# Patient Record
Sex: Male | Born: 1935 | Race: White | Hispanic: No | Smoking: Former smoker
Health system: Southern US, Community
[De-identification: ages and names within clinical notes are randomized; demographics above are authoritative.]

## PROBLEM LIST (undated history)

## (undated) DIAGNOSIS — J8410 Pulmonary fibrosis, unspecified: Secondary | ICD-10-CM

## (undated) DIAGNOSIS — I10 Essential (primary) hypertension: Secondary | ICD-10-CM

## (undated) DIAGNOSIS — I2510 Atherosclerotic heart disease of native coronary artery without angina pectoris: Secondary | ICD-10-CM

## (undated) DIAGNOSIS — E785 Hyperlipidemia, unspecified: Secondary | ICD-10-CM

## (undated) DIAGNOSIS — I219 Acute myocardial infarction, unspecified: Secondary | ICD-10-CM

## (undated) DIAGNOSIS — K922 Gastrointestinal hemorrhage, unspecified: Secondary | ICD-10-CM

## (undated) DIAGNOSIS — E119 Type 2 diabetes mellitus without complications: Secondary | ICD-10-CM

## (undated) DIAGNOSIS — R319 Hematuria, unspecified: Secondary | ICD-10-CM

## (undated) DIAGNOSIS — J449 Chronic obstructive pulmonary disease, unspecified: Secondary | ICD-10-CM

## (undated) DIAGNOSIS — M109 Gout, unspecified: Secondary | ICD-10-CM

## (undated) DIAGNOSIS — I255 Ischemic cardiomyopathy: Secondary | ICD-10-CM

## (undated) DIAGNOSIS — K219 Gastro-esophageal reflux disease without esophagitis: Secondary | ICD-10-CM

## (undated) DIAGNOSIS — I712 Thoracic aortic aneurysm, without rupture: Secondary | ICD-10-CM

## (undated) DIAGNOSIS — I7121 Aneurysm of the ascending aorta, without rupture: Secondary | ICD-10-CM

## (undated) DIAGNOSIS — I4891 Unspecified atrial fibrillation: Secondary | ICD-10-CM

## (undated) DIAGNOSIS — B1920 Unspecified viral hepatitis C without hepatic coma: Secondary | ICD-10-CM

## (undated) HISTORY — DX: Gastro-esophageal reflux disease without esophagitis: K21.9

## (undated) HISTORY — DX: Type 2 diabetes mellitus without complications: E11.9

## (undated) HISTORY — DX: Hematuria, unspecified: R31.9

## (undated) HISTORY — DX: Pulmonary fibrosis, unspecified: J84.10

## (undated) HISTORY — DX: Unspecified atrial fibrillation: I48.91

## (undated) HISTORY — DX: Atherosclerotic heart disease of native coronary artery without angina pectoris: I25.10

## (undated) HISTORY — DX: Essential (primary) hypertension: I10

## (undated) HISTORY — DX: Gout, unspecified: M10.9

## (undated) HISTORY — DX: Acute myocardial infarction, unspecified: I21.9

## (undated) HISTORY — PX: STOMACH SURGERY: SHX791

## (undated) HISTORY — DX: Hyperlipidemia, unspecified: E78.5

## (undated) HISTORY — DX: Gastrointestinal hemorrhage, unspecified: K92.2

## (undated) HISTORY — PX: MASTOIDECTOMY: SHX711

---

## 1971-08-29 HISTORY — PX: VAGOTOMY: SUR1431

## 1987-08-29 DIAGNOSIS — I219 Acute myocardial infarction, unspecified: Secondary | ICD-10-CM

## 1987-08-29 HISTORY — DX: Acute myocardial infarction, unspecified: I21.9

## 1994-08-28 HISTORY — PX: PR VEIN BYPASS GRAFT,AORTO-FEM-POP: 35551

## 1994-12-27 HISTORY — PX: CORONARY ARTERY BYPASS GRAFT: SHX141

## 1998-02-12 ENCOUNTER — Emergency Department (HOSPITAL_COMMUNITY): Admission: EM | Admit: 1998-02-12 | Discharge: 1998-02-12 | Payer: Self-pay | Admitting: Emergency Medicine

## 1999-03-15 ENCOUNTER — Encounter: Admission: RE | Admit: 1999-03-15 | Discharge: 1999-03-28 | Payer: Self-pay

## 1999-03-29 ENCOUNTER — Encounter: Admission: RE | Admit: 1999-03-29 | Discharge: 1999-06-27 | Payer: Self-pay

## 1999-11-01 ENCOUNTER — Encounter: Payer: Self-pay | Admitting: Cardiovascular Disease

## 1999-11-01 ENCOUNTER — Encounter: Admission: RE | Admit: 1999-11-01 | Discharge: 1999-11-01 | Payer: Self-pay | Admitting: Cardiovascular Disease

## 2000-07-04 ENCOUNTER — Encounter: Payer: Self-pay | Admitting: Cardiovascular Disease

## 2001-01-29 ENCOUNTER — Ambulatory Visit (HOSPITAL_COMMUNITY): Admission: RE | Admit: 2001-01-29 | Discharge: 2001-01-29 | Payer: Self-pay | Admitting: Cardiovascular Disease

## 2001-01-30 ENCOUNTER — Encounter: Payer: Self-pay | Admitting: Cardiovascular Disease

## 2001-06-10 ENCOUNTER — Ambulatory Visit (HOSPITAL_COMMUNITY): Admission: RE | Admit: 2001-06-10 | Discharge: 2001-06-10 | Payer: Self-pay | Admitting: Family Medicine

## 2001-06-10 ENCOUNTER — Encounter: Payer: Self-pay | Admitting: Family Medicine

## 2003-02-18 ENCOUNTER — Encounter: Payer: Self-pay | Admitting: Gastroenterology

## 2003-02-18 ENCOUNTER — Ambulatory Visit (HOSPITAL_COMMUNITY): Admission: RE | Admit: 2003-02-18 | Discharge: 2003-02-18 | Payer: Self-pay | Admitting: Gastroenterology

## 2003-02-21 ENCOUNTER — Encounter: Payer: Self-pay | Admitting: Emergency Medicine

## 2003-02-21 ENCOUNTER — Inpatient Hospital Stay (HOSPITAL_COMMUNITY): Admission: EM | Admit: 2003-02-21 | Discharge: 2003-02-24 | Payer: Self-pay | Admitting: Emergency Medicine

## 2003-07-31 ENCOUNTER — Ambulatory Visit (HOSPITAL_COMMUNITY): Admission: RE | Admit: 2003-07-31 | Discharge: 2003-07-31 | Payer: Self-pay | Admitting: Family Medicine

## 2005-02-16 ENCOUNTER — Encounter: Admission: RE | Admit: 2005-02-16 | Discharge: 2005-02-16 | Payer: Self-pay | Admitting: Cardiothoracic Surgery

## 2005-06-06 ENCOUNTER — Observation Stay (HOSPITAL_COMMUNITY): Admission: EM | Admit: 2005-06-06 | Discharge: 2005-06-06 | Payer: Self-pay | Admitting: Emergency Medicine

## 2005-07-13 ENCOUNTER — Ambulatory Visit: Payer: Self-pay | Admitting: Gastroenterology

## 2005-08-04 ENCOUNTER — Ambulatory Visit: Payer: Self-pay | Admitting: Gastroenterology

## 2005-08-11 ENCOUNTER — Ambulatory Visit: Payer: Self-pay | Admitting: Gastroenterology

## 2005-08-21 ENCOUNTER — Emergency Department (HOSPITAL_COMMUNITY): Admission: EM | Admit: 2005-08-21 | Discharge: 2005-08-21 | Payer: Self-pay | Admitting: *Deleted

## 2006-02-01 ENCOUNTER — Encounter: Admission: RE | Admit: 2006-02-01 | Discharge: 2006-02-01 | Payer: Self-pay | Admitting: Cardiothoracic Surgery

## 2006-02-26 ENCOUNTER — Ambulatory Visit (HOSPITAL_COMMUNITY): Admission: RE | Admit: 2006-02-26 | Discharge: 2006-02-26 | Payer: Self-pay | Admitting: Otolaryngology

## 2006-04-06 ENCOUNTER — Ambulatory Visit: Payer: Self-pay | Admitting: Gastroenterology

## 2006-04-07 ENCOUNTER — Emergency Department (HOSPITAL_COMMUNITY): Admission: EM | Admit: 2006-04-07 | Discharge: 2006-04-07 | Payer: Self-pay | Admitting: Emergency Medicine

## 2006-04-11 ENCOUNTER — Ambulatory Visit: Payer: Self-pay | Admitting: Gastroenterology

## 2006-04-20 ENCOUNTER — Ambulatory Visit: Payer: Self-pay | Admitting: Gastroenterology

## 2006-05-02 ENCOUNTER — Ambulatory Visit: Payer: Self-pay | Admitting: Gastroenterology

## 2006-05-02 ENCOUNTER — Encounter (INDEPENDENT_AMBULATORY_CARE_PROVIDER_SITE_OTHER): Payer: Self-pay | Admitting: Specialist

## 2006-06-14 ENCOUNTER — Ambulatory Visit: Payer: Self-pay | Admitting: Gastroenterology

## 2007-01-04 ENCOUNTER — Ambulatory Visit: Payer: Self-pay | Admitting: Gastroenterology

## 2007-01-04 LAB — CONVERTED CEMR LAB
ALT: 15 units/L (ref 0–40)
Amylase: 39 units/L (ref 27–131)
Basophils Absolute: 0.1 10*3/uL (ref 0.0–0.1)
Bilirubin, Direct: 0.2 mg/dL (ref 0.0–0.3)
Calcium: 9.1 mg/dL (ref 8.4–10.5)
Folate: 10.3 ng/mL
GFR calc Af Amer: 75 mL/min
GFR calc non Af Amer: 62 mL/min
Glucose, Bld: 99 mg/dL (ref 70–99)
Hemoglobin: 13.8 g/dL (ref 13.0–17.0)
MCHC: 34.3 g/dL (ref 30.0–36.0)
Monocytes Absolute: 0.8 10*3/uL — ABNORMAL HIGH (ref 0.2–0.7)
Monocytes Relative: 9.5 % (ref 3.0–11.0)
Platelets: 161 10*3/uL (ref 150–400)
RDW: 12.5 % (ref 11.5–14.6)
Sodium: 140 meq/L (ref 135–145)
TSH: 2.69 microintl units/mL (ref 0.35–5.50)
Vitamin B-12: 279 pg/mL (ref 211–911)

## 2007-02-14 ENCOUNTER — Encounter: Admission: RE | Admit: 2007-02-14 | Discharge: 2007-02-14 | Payer: Self-pay | Admitting: Thoracic Surgery

## 2007-02-14 ENCOUNTER — Ambulatory Visit: Payer: Self-pay | Admitting: Cardiothoracic Surgery

## 2007-02-14 ENCOUNTER — Ambulatory Visit: Payer: Self-pay | Admitting: Gastroenterology

## 2007-07-30 ENCOUNTER — Ambulatory Visit (HOSPITAL_COMMUNITY): Admission: RE | Admit: 2007-07-30 | Discharge: 2007-07-30 | Payer: Self-pay | Admitting: Cardiovascular Disease

## 2007-08-29 DIAGNOSIS — I4891 Unspecified atrial fibrillation: Secondary | ICD-10-CM

## 2007-08-29 HISTORY — DX: Unspecified atrial fibrillation: I48.91

## 2007-09-22 ENCOUNTER — Emergency Department (HOSPITAL_COMMUNITY): Admission: EM | Admit: 2007-09-22 | Discharge: 2007-09-23 | Payer: Self-pay | Admitting: Emergency Medicine

## 2007-10-25 ENCOUNTER — Ambulatory Visit: Payer: Self-pay | Admitting: Gastroenterology

## 2007-10-25 LAB — CONVERTED CEMR LAB
Basophils Relative: 0.7 % (ref 0.0–1.0)
Eosinophils Relative: 4.6 % (ref 0.0–5.0)
Ferritin: 130.1 ng/mL (ref 22.0–322.0)
Folate: 9.6 ng/mL
Iron: 46 ug/dL (ref 42–165)
Platelets: 144 10*3/uL — ABNORMAL LOW (ref 150–400)
RBC: 4.5 M/uL (ref 4.22–5.81)
Saturation Ratios: 16.6 % — ABNORMAL LOW (ref 20.0–50.0)
WBC: 5.3 10*3/uL (ref 4.5–10.5)

## 2007-11-13 DIAGNOSIS — I119 Hypertensive heart disease without heart failure: Secondary | ICD-10-CM | POA: Insufficient documentation

## 2007-11-13 DIAGNOSIS — I10 Essential (primary) hypertension: Secondary | ICD-10-CM | POA: Insufficient documentation

## 2007-11-13 DIAGNOSIS — E785 Hyperlipidemia, unspecified: Secondary | ICD-10-CM

## 2007-11-13 DIAGNOSIS — K219 Gastro-esophageal reflux disease without esophagitis: Secondary | ICD-10-CM | POA: Insufficient documentation

## 2007-11-15 ENCOUNTER — Ambulatory Visit: Payer: Self-pay | Admitting: Critical Care Medicine

## 2007-11-15 DIAGNOSIS — I4891 Unspecified atrial fibrillation: Secondary | ICD-10-CM | POA: Insufficient documentation

## 2007-11-15 DIAGNOSIS — J849 Interstitial pulmonary disease, unspecified: Secondary | ICD-10-CM | POA: Insufficient documentation

## 2007-11-15 DIAGNOSIS — I2510 Atherosclerotic heart disease of native coronary artery without angina pectoris: Secondary | ICD-10-CM | POA: Insufficient documentation

## 2007-11-28 ENCOUNTER — Telehealth (INDEPENDENT_AMBULATORY_CARE_PROVIDER_SITE_OTHER): Payer: Self-pay | Admitting: *Deleted

## 2007-12-21 DIAGNOSIS — M109 Gout, unspecified: Secondary | ICD-10-CM

## 2007-12-21 DIAGNOSIS — K449 Diaphragmatic hernia without obstruction or gangrene: Secondary | ICD-10-CM | POA: Insufficient documentation

## 2007-12-21 DIAGNOSIS — K319 Disease of stomach and duodenum, unspecified: Secondary | ICD-10-CM | POA: Insufficient documentation

## 2008-01-17 ENCOUNTER — Telehealth: Payer: Self-pay | Admitting: Gastroenterology

## 2008-02-13 ENCOUNTER — Ambulatory Visit: Payer: Self-pay | Admitting: Cardiothoracic Surgery

## 2008-02-13 ENCOUNTER — Encounter: Admission: RE | Admit: 2008-02-13 | Discharge: 2008-02-13 | Payer: Self-pay | Admitting: Cardiothoracic Surgery

## 2008-07-28 ENCOUNTER — Encounter: Admission: RE | Admit: 2008-07-28 | Discharge: 2008-07-28 | Payer: Self-pay | Admitting: Family Medicine

## 2008-12-29 ENCOUNTER — Ambulatory Visit: Payer: Self-pay | Admitting: Internal Medicine

## 2009-02-08 ENCOUNTER — Encounter: Payer: Self-pay | Admitting: Critical Care Medicine

## 2009-02-18 ENCOUNTER — Encounter: Admission: RE | Admit: 2009-02-18 | Discharge: 2009-02-18 | Payer: Self-pay | Admitting: Cardiothoracic Surgery

## 2009-02-18 ENCOUNTER — Ambulatory Visit (HOSPITAL_COMMUNITY): Admission: RE | Admit: 2009-02-18 | Discharge: 2009-02-18 | Payer: Self-pay | Admitting: Cardiovascular Disease

## 2009-03-02 ENCOUNTER — Ambulatory Visit: Payer: Self-pay | Admitting: Cardiothoracic Surgery

## 2010-02-17 ENCOUNTER — Ambulatory Visit: Payer: Self-pay | Admitting: Cardiothoracic Surgery

## 2010-02-17 ENCOUNTER — Encounter: Admission: RE | Admit: 2010-02-17 | Discharge: 2010-02-17 | Payer: Self-pay | Admitting: Cardiothoracic Surgery

## 2010-07-08 ENCOUNTER — Encounter: Admission: RE | Admit: 2010-07-08 | Discharge: 2010-07-08 | Payer: Self-pay | Admitting: Cardiovascular Disease

## 2010-07-15 ENCOUNTER — Ambulatory Visit: Payer: Self-pay | Admitting: Critical Care Medicine

## 2010-07-15 DIAGNOSIS — J209 Acute bronchitis, unspecified: Secondary | ICD-10-CM

## 2010-08-08 ENCOUNTER — Ambulatory Visit: Payer: Self-pay | Admitting: Adult Health

## 2010-08-09 ENCOUNTER — Ambulatory Visit: Payer: Self-pay | Admitting: Critical Care Medicine

## 2010-08-19 ENCOUNTER — Telehealth: Payer: Self-pay | Admitting: Adult Health

## 2010-08-19 ENCOUNTER — Telehealth: Payer: Self-pay | Admitting: Internal Medicine

## 2010-09-09 ENCOUNTER — Ambulatory Visit
Admission: RE | Admit: 2010-09-09 | Discharge: 2010-09-09 | Payer: Self-pay | Source: Home / Self Care | Attending: Critical Care Medicine | Admitting: Critical Care Medicine

## 2010-09-22 ENCOUNTER — Telehealth (INDEPENDENT_AMBULATORY_CARE_PROVIDER_SITE_OTHER): Payer: Self-pay | Admitting: *Deleted

## 2010-09-27 NOTE — Assessment & Plan Note (Signed)
Summary: Acute NP office visit - bronchitis   Primary Provider/Referring Provider:  Dr. [name]  CC:  prod cough with clear/brown mucus, wheezing, and increased SOB x2weeks - denies f/c/s.  History of Present Illness: 77 years old male who presents for consultation at the request of his referring provider for initial Pulmonary consult on 3/09.  The patient presents with heartburn, cough, shortness of breath, shortness of breath with exertion, and URI symptoms, but has no history of fever, fatigue, failure to thrive, weight loss, night sweats, nosebleeds, easy bruising, chest pain, hemoptysis, joint swelling, leg swelling, and peripheral edema.  The patient comes in today for evaluation of coughing up sputum, sinus pressure, heartburn, and hoarseness.  Today's visit is for evaluation and management.  The patient's current symptoms are improving and do not impair daily activities.  Prior evaluation and treatment (see reports for full details) has included CXR.     Past history is also pertinent in that patient was on mechanical ventilation for prolonged period time with associated tracheostomy placement with the patient's 1996 bypass surgery.     Jan 01, 2009--Last seen 3/09. At that time was felt to have cyclical cough, ACE was stopped and GERD prevention rx. He returns today back on ACE. Reports his symptoms did improve but over last several weeks now returning. C/o increased sob with any exertion - worse in am's - gurgling in upper chest. Minimally productive cough. no chest pain, Denies orthopnea, hemoptysis, fever, n/v/d, edema, headache,recent travel.   July 15, 2010 --Presents for an acute office visit. Complains of productive  cough with clear/brown mucus, wheezing, increased SOB x2weeks. Started with sinus drip/drainage. THen cough started.  Previously taken off ACE last ov. This has been restarted. He says he has done well w/ no cough until last 2 weeks.  Had recent CT and xray w/ no  acute findings. Denies chest pain, orthopnea, hemoptysis, fever, n/v/d, edema, headache,.    Medications Prior to Update: 1)  Hytrin 5 Mg  Caps (Terazosin Hcl) .... Once Daily 2)  Norvasc 2.5 Mg Tabs (Amlodipine Besylate) .... Take One By Mouth Once Daily 3)  Zocor 80 Mg  Tabs (Simvastatin) .... Once Daily 4)  Lasix 40 Mg Tabs (Furosemide) .... Take One By Mouth Two Times A Day 5)  Combivent 103-18 Mcg/act Aero (Ipratropium-Albuterol) .... Use 1 Puff Two Times A Day 6)  Nabumetone 500 Mg Tabs (Nabumetone) .... Take One By Mouth Two Times A Day 7)  Klor-Con M20 20 Meq Cr-Tabs (Potassium Chloride Crys Cr) .... Take Two Tabs Once Daily 8)  Multaq 400 Mg Tabs (Dronedarone Hcl) .... Take One By Mouth Once Daily 9)  Diovan 40 Mg Tabs (Valsartan) .... Take One By Mouth Once Daily 10)  Metoprolol Tartrate 50 Mg Tabs (Metoprolol Tartrate) .... Take One By Mouth Two Times A Day 11)  Lisinopril 40 Mg Tabs (Lisinopril) .... Take One By Mouth Two Times A Day 12)  Warfarin Sodium 5 Mg Tabs (Warfarin Sodium) .... Take As Directed 13)  Lorazepam 0.5 Mg Tabs (Lorazepam) .... Take One By Mouth Once Daily  Current Medications (verified): 1)  Hytrin 5 Mg  Caps (Terazosin Hcl) .... Once Daily 2)  Norvasc 2.5 Mg Tabs (Amlodipine Besylate) .... Take One By Mouth Once Daily 3)  Zocor 80 Mg  Tabs (Simvastatin) .... Once Daily 4)  Lasix 40 Mg Tabs (Furosemide) .... Take 1 Tablet By Mouth Once A Day 5)  Combivent 103-18 Mcg/act Aero (Ipratropium-Albuterol) .... Use 1 Puff Two  Times A Day 6)  Lisinopril 40 Mg Tabs (Lisinopril) .... Take One By Mouth Two Times A Day 7)  Warfarin Sodium 5 Mg Tabs (Warfarin Sodium) .... Take As Directed 8)  Sotalol Hcl 80 Mg Tabs (Sotalol Hcl) .... Take 1 Tablet By Mouth Two Times A Day 9)  Amlodipine Besylate 5 Mg Tabs (Amlodipine Besylate) .... Take 1 Tablet By Mouth Once A Day  Allergies (verified): 1)  ! Tagamet 2)  ! 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 LUNG,HX. OF  Past Surgical History: Last updated: 12/21/2007 C A B G:1996 STOMACH SURG. MASTOID R. EAR  Family History: Last updated: 11/15/2007 Liver cancer in sister  Social History: Last updated: 11/15/2007 Patient states former smoker.   Film/video editor and retired  Risk Factors: Smoking Status: quit (11/15/2007)  Review of Systems      See HPI  Vital Signs:  Patient profile:   77 year old male Height:      72 inches Weight:      198.38 pounds BMI:     27.00 O2 Sat:      98 % on Room air Temp:     98.1 degrees F oral Pulse rate:   70 / minute BP sitting:   140 / 80  (left arm) Cuff size:   regular  Vitals Entered By: Boone Master CNA/MA (July 15, 2010 11:19 AM)  O2 Flow:  Room air CC: prod cough with clear/brown mucus, wheezing, increased SOB x2weeks - denies f/c/s Is Patient Diabetic? No Comments Medications reviewed with patient Daytime contact number verified with patient. Boone Master CNA/MA  July 15, 2010 11:19 AM    Physical Exam  Additional Exam:  Gen: WD WN   WM       in NAD    NCAT Heent:  no jvd, no TMG, no cervical LNademopathy, orophyx clear,  nares with clear watery drainage. Cor: RRR nl s1/s2  no s3/s4  no m r h g Abd: soft NT BSA   no masses  No HSM  no rebound or guarding Ext perfused with no c v e v.d Neuro: intact, moves all 4s, CN II-XII intact, DTRs intact Chest:  coarse BS w/ exp wheezing Skin: clear      Impression & Recommendations:  Problem # 1:  BRONCHITIS, ACUTE (ICD-466.0)  Flare in pt with known hx of cyclic cough he is not a great candidate for ACE inhibitor and non selective beta blocker , however  has not a flare in last year so will not change at time, will treat for acute flare w/ close follow up  Plan;   Augmentin 875mg  two times a day for 7 days Mucinex DM two times a day as needed cough/congestion.  Increase fluids and rest.  follow up Dr. Delford Field in 3-4 weeks and as needed  Please contact office for sooner follow up if symptoms do not improve or worsen   His updated medication list for this problem includes:    Combivent 103-18 Mcg/act Aero (Ipratropium-albuterol) ..... Use 1 puff two times a day    Augmentin 875-125 Mg Tabs (Amoxicillin-pot clavulanate) .Marland Kitchen... 1 by mouth two times a day  Orders: Est. Patient Level IV (16109)  Medications Added to Medication List This Visit: 1)  Lasix 40  Mg Tabs (Furosemide) .... Take 1 tablet by mouth once a day 2)  Sotalol Hcl 80 Mg Tabs (Sotalol hcl) .... Take 1 tablet by mouth two times a day 3)  Amlodipine Besylate 5 Mg Tabs (Amlodipine besylate) .... Take 1 tablet by mouth once a day 4)  Augmentin 875-125 Mg Tabs (Amoxicillin-pot clavulanate) .Marland Kitchen.. 1 by mouth two times a day  Complete Medication List: 1)  Hytrin 5 Mg Caps (Terazosin hcl) .... Once daily 2)  Norvasc 2.5 Mg Tabs (Amlodipine besylate) .... Take one by mouth once daily 3)  Zocor 80 Mg Tabs (Simvastatin) .... Once daily 4)  Lasix 40 Mg Tabs (Furosemide) .... Take 1 tablet by mouth once a day 5)  Combivent 103-18 Mcg/act Aero (Ipratropium-albuterol) .... Use 1 puff two times a day 6)  Lisinopril 40 Mg Tabs (Lisinopril) .... Take one by mouth two times a day 7)  Warfarin Sodium 5 Mg Tabs (Warfarin sodium) .... Take as directed 8)  Sotalol Hcl 80 Mg Tabs (Sotalol hcl) .... Take 1 tablet by mouth two times a day 9)  Amlodipine Besylate 5 Mg Tabs (Amlodipine besylate) .... Take 1 tablet by mouth once a day 10)  Augmentin 875-125 Mg Tabs (Amoxicillin-pot clavulanate) .Marland Kitchen.. 1 by mouth two times a day  Other Orders: Nebulizer Tx (16109)  Patient Instructions: 1)  Augmentin  875mg  two times a day for 7 days 2)  Mucinex DM two times a day as needed cough/congestion.  3)  Increase fluids and rest.  4)  follow up Dr. Delford Field in 3-4 weeks and as needed  5)  Please contact office for sooner follow up if symptoms do not improve or worsen  Prescriptions: AUGMENTIN 875-125 MG TABS (AMOXICILLIN-POT CLAVULANATE) 1 by mouth two times a day  #14 x 0   Entered and Authorized by:   Rubye Oaks NP   Signed by:   Rubye Oaks NP on 07/15/2010   Method used:   Electronically to        [facility] [phone]* (retail)       [address] [place], [place]  [address]       Ph: [phone] or [phone]       Fax: [phone]   RxID:   [id number]    Immunization History:  Influenza Immunization History:    Influenza:  historical (04/28/2010)  Pneumovax Immunization History:    Pneumovax:  historical (08/28/2006)    Orders Added: 1)  Nebulizer Tx [03474] 2)  Est. Patient Level IV [25956]

## 2010-09-29 NOTE — Progress Notes (Signed)
Summary: prescription  Phone Note Call from Patient Call back at Home Phone [phone]   Caller: Patient Call For: dr Delford Field Summary of Call: Patient phoned stated that he has seen Dr Delford Field twice and Tammy once. He stated that he still has the rattling cough. He stated that the medication was helping him but he has completed both the amoxicillin and prednisone and the day after he started the prednisone he felt wonderful. He took his last prednison a week or go. He wants to know if they can call in some more medicine or if he needs to be seen. He is still coughing and wheezing off and on. Patient can be reached [phone]. He uses [facility] in [place]. Initial call taken by: Vedia Coffer,  September 22, 2010 11:35 AM  Follow-up for Phone Call        Spoke with pt.  He is c/o prod cough with clear sputum and chest congestion.  He states that his breathing has improved since he was last seen.  He states that he feels "weak as water".  I offered ov with TP tommorrow and he refused stating did not feel well enough to come in.  He has not tried anything otc for cough.  I reccomended maybe mucinex? Pls advise recs thanks Follow-up by: Vernie Murders,  September 22, 2010 2:56 PM  Additional Follow-up for Phone Call Additional follow up Details #1::        rx prednisone 10mg  4 each am x3days, 3 x 3days, 2 x 3days, 1 x 3days then stop Mucinex DM two two times a day  Additional Follow-up by: Storm Frisk MD,  September 22, 2010 3:40 PM    Additional Follow-up for Phone Call Additional follow up Details #2::    Rx was sent.  Spoke with pt and notified of the above recs per PW.  Pt verbalized understanding. Follow-up by: Vernie Murders,  September 22, 2010 3:46 PM  New/Updated Medications: PREDNISONE 10 MG TABS (PREDNISONE) 4 x 3 days, 3 x 3 days, 2 x 3 days, 1 x 3 days then stop Prescriptions: PREDNISONE 10 MG TABS (PREDNISONE) 4 x 3 days, 3 x 3 days, 2 x 3 days, 1 x 3 days then stop  #30 x 0  Entered by:   Vernie Murders   Authorized by:   Storm Frisk MD   Signed by:   Vernie Murders on 09/22/2010   Method used:   Electronically to        [facility] [phone]* (retail)       [address] [place], [place]  [address]       Ph: [phone] or [phone]       Fax: [phone]   RxID:   [id number]

## 2010-09-29 NOTE — Progress Notes (Signed)
Summary: inhailer  Phone Note Call from Patient Call back at Home Phone [phone]   Caller: Patient Call For: tammy parrett Summary of Call: Mr. Khader phoned and stated that Tammy Parrett told him to get an inhailerat his November visit but he cant remeber what type she told him to get. Please call him at [phone] Initial call taken by: Vedia Coffer,  August 19, 2010 1:16 PM  Follow-up for Phone Call        pt needs refill on combivent inhaler. refill sent.Carron Curie CMA  August 19, 2010 2:23 PM     New/Updated Medications: COMBIVENT 103-18 MCG/ACT AERO (IPRATROPIUM-ALBUTEROL) Use 1 puff two times a day Prescriptions: COMBIVENT 103-18 MCG/ACT AERO (IPRATROPIUM-ALBUTEROL) Use 1 puff two times a day  #1 x 2   Entered by:   Carron Curie CMA   Authorized by:   Storm Frisk MD   Signed by:   Carron Curie CMA on 08/19/2010   Method used:   Electronically to        [facility] [phone]* (retail)       [address] [place], [place]  [address]       Ph: [phone] or [phone]       Fax: [phone]   RxID:   [id number]

## 2010-09-29 NOTE — Progress Notes (Signed)
Summary: ?ACE reaction? > needs eval asap either here or with [name]  Phone Note Call from Patient Call back at [facility] Phone [phone]   Caller: Patient Call For: PW Summary of Call: PT C/O WHEEZING THAT BEGAN THIS MORNING. NO FEVER. PT WAS SLIGHTLY NAUSEATED THIS AM BUT SAYS THIS HAS PASSED. PT STATES HE DOES NOT HAVE AN INHALER BUT FEELS THIS WOULD HELP. [facility] IN [place].  Initial call taken by: Tivis Ringer, CNA,  August 19, 2010 12:05 PM  Follow-up for Phone Call        called spoke with patient who c/o wheezing, DOE, occ dry cough w/ a tickle in his throat x2-3days, worse today.  PEW not in office today, will send to doc of the day.  last ov w/ PEW 12.13.11 - does state that Dr. [name] put pt back on lisinopril 40mg .  allergies: tagamet, zantac.  [facility] in [place]. Follow-up by: Boone Master CNA/MA,  August 19, 2010 12:23 PM  Additional Follow-up for Phone Call Additional follow up Details #1::        can't treat this accurately over the phone but strongly suspect is is the lisinopril and he will need to either check with Dr [name] to have this corrected or go to ER if can't get him into the office today Additional Follow-up by: Nyoka Cowden MD,  August 19, 2010 12:48 PM    Additional Follow-up for Phone Call Additional follow up Details #2::    Pt advised of recs. Carron Curie CMA  August 19, 2010 12:54 PM

## 2010-09-29 NOTE — Assessment & Plan Note (Signed)
Summary: Pulmonary OV   Primary Provider/Referring Provider:  Dr. [name]  CC:  Acute visit.  increaesed SOB, chest congestion, soreness left chest, and wheezing - onset last night.  Denies f/c/s.[name] [name]  History of Present Illness: 77 years old male  with upper airway instability syndrome.  Pt taking ACE inhibitor despite our rec to avoid.     Past history is also pertinent in that patient was on mechanical ventilation for prolonged period time with associated tracheostomy placement with the patient's 1996 bypass surgery.     Jan 01, 2009--Last seen 3/09. At that time was felt to have cyclical cough, ACE was stopped and GERD prevention rx. He returns today back on ACE. Reports his symptoms did improve but over last several weeks now returning. C/o increased sob with any exertion - worse in am's - gurgling in upper chest. Minimally productive cough. no chest pain, Denies orthopnea, hemoptysis, fever, n/v/d, edema, headache,recent travel.   July 15, 2010 --Presents for an acute office visit. Complains of productive  cough with clear/brown mucus, wheezing, increased SOB x2weeks. Started with sinus drip/drainage. THen cough started.  Previously taken off ACE last ov. This has been restarted. He says he has done well w/ no cough until last 2 weeks.  Had recent CT and xray w/ no acute findings. Denies chest pain, orthopnea, hemoptysis, fever, n/v/d, edema, headache,.   August 09, 2010 3:24 PM This pt saw NP 11/18: for acute bronchitis.  Will awaken at night dyspneic.  Happens once a week.  Not much coughing with this.  Cough is better.  No real wheeze.  No mucus as per november.  no real heart burn at night.   This pt notes he is doing ok and tolerating ACE inhibitor well.September 09, 2010 3:19 PM onset this past week, much worse.  coughs,  not able to sleep.,  notes some wheezing.  no pndrip no f/c/s.  Current Medications (verified): 1)  Hytrin 5 Mg  Caps (Terazosin Hcl) .... Once Daily 2)   Zocor 80 Mg  Tabs (Simvastatin) .... Once Daily 3)  Lasix 40 Mg Tabs (Furosemide) .... Take 1 Tablet By Mouth Once A Day 4)  Combivent 103-18 Mcg/act Aero (Ipratropium-Albuterol) .... Use 1 Puff Two Times A Day 5)  Lisinopril 40 Mg Tabs (Lisinopril) .[name] [name].. 1 By Mouth Daily 6)  Warfarin Sodium 5 Mg Tabs (Warfarin Sodium) .... Take As Directed 7)  Sotalol Hcl 80 Mg Tabs (Sotalol Hcl) .... Take 1 Tablet By Mouth Two Times A Day 8)  Amlodipine Besylate 5 Mg Tabs (Amlodipine Besylate) .... Take 1 Tablet By Mouth Once A Day  Allergies (verified): 1)  ! Tagamet 2)  ! Zantac  Review of Systems       The patient complains of shortness of breath with activity, productive cough, and non-productive cough.  The patient denies shortness of breath at rest, coughing up blood, chest pain, irregular heartbeats, acid heartburn, indigestion, loss of appetite, weight change, abdominal pain, difficulty swallowing, sore throat, tooth/dental problems, headaches, nasal congestion/difficulty breathing through nose, sneezing, itching, ear ache, anxiety, depression, hand/feet swelling, joint stiffness or pain, rash, change in color of mucus, and fever.    Vital Signs:  Patient profile:   77 year old male Height:      72 inches Weight:      196 pounds BMI:     26.68 O2 Sat:      94 % on Room air Temp:     97.8 degrees F oral Pulse  rate:   98 / minute BP sitting:   148 / 80  (left arm) Cuff size:   regular  Vitals Entered By: Gweneth Dimitri RN (September 09, 2010 2:55 PM)  O2 Flow:  Room air CC: Acute visit.  increaesed SOB, chest congestion, soreness left chest, wheezing - onset last night.  Denies f/c/s. Comments Medications reviewed with patient Daytime contact number verified with patient. Gweneth Dimitri RN  September 09, 2010 2:59 PM     Medications Added to Medication List This Visit: 1)  Lisinopril 40 Mg Tabs (Lisinopril) .... Hold for 4 weeks then resume when  benicar sample runs out 2)  Benicar 40 Mg  Tabs (Olmesartan medoxomil) .... One tablet by mouth daily until samples run out 3)  Augmentin 875-125 Mg Tabs (Amoxicillin-pot clavulanate) .... By mouth twice daily 4)  Prednisone 10 Mg Tabs (Prednisone) .... Take as directed take 4 daily for two days, then 3 daily for two days, then two daily for two days then one daily for two days then stop  Other Orders: Est. Patient Level V (16109)  Patient Instructions: 1)  Prednisone 10mg  Take 4 daily for two days, then 3 daily for two days, then two daily for two days then one daily for two days then stop 2)  Augmentin one twice daily for 7days 3)  Stop lisinopril for 4 weeks then resume 4)  Start Benicar for 4 weeks then stop when sample run out 5)  No change in benicar 6)  Return one month Prescriptions: PREDNISONE 10 MG  TABS (PREDNISONE) Take as directed Take 4 daily for two days, then 3 daily for two days, then two daily for two days then one daily for two days then stop  #20 x 0   Entered and Authorized by:   Storm Frisk MD   Signed by:   Storm Frisk MD on 09/09/2010   Method used:   Electronically to        [facility] [phone]* (retail)       [address] [place], [place]  [address]       Ph: [phone] or [phone]       Fax: [phone]   RxID:   [id number] AUGMENTIN 875-125 MG  TABS (AMOXICILLIN-POT CLAVULANATE) By mouth twice daily  #14 x 0   Entered and Authorized by:   Storm Frisk MD   Signed by:   Storm Frisk MD on 09/09/2010   Method used:   Electronically to        [facility] [phone]* (retail)       [address] [place], [place]  [address]       Ph: [phone] or [phone]       Fax: [phone]   RxID:   [id number]     Appended Document: Pulmonary OV     Primary Provider/Referring Provider:  Dr. [name]   History of Present Illness: 77 years old male  with upper airway instability syndrome.  Pt  taking ACE inhibitor despite our rec to avoid.     Past history is also pertinent in that patient was on mechanical ventilation for prolonged period time with associated tracheostomy placement with the patient's 1996 bypass surgery.     Jan 01, 2009--Last seen 3/09. At that time was felt  to have cyclical cough, ACE was stopped and GERD prevention rx. He returns today back on ACE. Reports his symptoms did improve but over last several weeks now returning. C/o increased sob with any exertion - worse in am's - gurgling in upper chest. Minimally productive cough. no chest pain, Denies orthopnea, hemoptysis, fever, n/v/d, edema, headache,recent travel.   July 15, 2010 --Presents for an acute office visit. Complains of productive  cough with clear/brown mucus, wheezing, increased SOB x2weeks. Started with sinus drip/drainage. THen cough started.  Previously taken off ACE last ov. This has been restarted. He says he has done well w/ no cough until last 2 weeks.  Had recent CT and xray w/ no acute findings. Denies chest pain, orthopnea, hemoptysis, fever, n/v/d, edema, headache,.   August 09, 2010 3:24 PM This pt saw NP 11/18: for acute bronchitis.  Will awaken at night dyspneic.  Happens once a week.  Not much coughing with this.  Cough is better.  No real wheeze.  No mucus as per november.  no real heart burn at night.   This pt notes he is doing ok and tolerating ACE inhibitor well.September 09, 2010 3:19 PM onset this past week, much worse.  coughs,  not able to sleep.,  notes some wheezing.  no pndrip no f/c/s.  Allergies: 1)  ! Tagamet 2)  ! Zantac  Past History:  Past medical, surgical, family and social histories (including risk factors) reviewed, and no changes noted (except as noted below).  Past Medical History: Reviewed history from 12/21/2007 and no changes required. 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 LUNG,HX. OF  Past Surgical History: Reviewed history from 12/21/2007 and no changes required. C A B G:1996 STOMACH SURG. MASTOID R. EAR  Family History: Reviewed history from 11/15/2007 and no changes required. Liver cancer in sister  Social History: Reviewed history from 11/15/2007 and no changes required. Patient states former smoker.   Film/video editor and retired  Review of Systems       The patient complains of shortness of breath with activity, shortness of breath at rest, productive cough, non-productive cough, and nasal congestion/difficulty breathing through nose.  The patient denies coughing up blood, chest pain, irregular heartbeats, acid heartburn, indigestion, loss of appetite, weight change, abdominal pain, difficulty swallowing, sore throat, tooth/dental problems, headaches, sneezing, itching, ear ache, anxiety, depression, hand/feet swelling, joint stiffness or pain, rash, change in color of mucus, and fever.    Physical Exam  Additional Exam:  Gen: WD WN   WM       in NAD    NCAT Heent:  no jvd, no TMG, no cervical LNademopathy, orophyx clear,  nares with purulent drainage. Cor: RRR nl s1/s2  no s3/s4  no m r h g Abd: soft NT BSA   no masses  No HSM  no rebound or guarding Ext perfused with no c v e v.d Neuro: intact, moves all 4s, CN II-XII intact, DTRs intact Chest:  coarse BS w/ no wheezing noted Skin: clear      Impression & Recommendations:  Problem # 1:  BRONCHITIS, ACUTE (ICD-466.0) Assessment Deteriorated acute bronchitis and sinusitis plan 1)  Prednisone 10mg  Take 4 daily for two days, then 3 daily for two days, then two daily for two days then one daily for two days then stop 2)  Augmentin one twice daily for 7days 3)  Stop lisinopril for 4 weeks then resume 4)  Start  Benicar for 4 weeks then stop when sample run o His updated medication list for this problem includes:    Combivent 103-18 Mcg/act Aero (Ipratropium-albuterol) ..... Use 1 puff two times a day    Augmentin 875-125 Mg Tabs (Amoxicillin-pot clavulanate) ..... By mouth twice daily  Complete Medication List: 1)  Hytrin 5 Mg Caps (Terazosin hcl) .... Once daily 2)  Zocor 80 Mg Tabs (Simvastatin) .... Once daily 3)  Lasix 40 Mg Tabs (Furosemide) .... Take 1 tablet by mouth once a day 4)  Combivent 103-18 Mcg/act Aero (Ipratropium-albuterol) .... Use 1 puff two times a day 5)  Lisinopril 40 Mg Tabs (Lisinopril) .... Hold for 4 weeks then resume when  benicar sample runs out 6)  Warfarin Sodium 5 Mg Tabs (Warfarin sodium) .... Take as directed 7)  Sotalol Hcl 80 Mg Tabs (Sotalol hcl) .... Take 1 tablet by mouth two times a day 8)  Amlodipine Besylate 5 Mg Tabs (Amlodipine besylate) .... Take 1 tablet by mouth once a day 9)  Benicar 40 Mg Tabs (Olmesartan medoxomil) .... One tablet by mouth daily until samples run out 10)  Augmentin 875-125 Mg Tabs (Amoxicillin-pot clavulanate) .... By mouth twice daily 11)  Prednisone 10 Mg Tabs (Prednisone) .... Take as directed take 4 daily for two days, then 3 daily for two days, then two daily for two days then one daily for two days then stop  Appended Document: Pulmonary OV fax don moore

## 2010-09-29 NOTE — Assessment & Plan Note (Signed)
Summary: Pulmonary OV   Visit Type:  Follow-up Primary Provider/Referring Provider:  Dr. [name]  CC:  Pt states cough has improved...c/o incr SOB when lying down at night...Dr. [name] put pt back on Lisinopril 40mg  once daily.  History of Present Illness: 77 years old male  with upper airway instability syndrome.  Pt taking ACE inhibitor despite our rec to avoid.     Past history is also pertinent in that patient was on mechanical ventilation for prolonged period time with associated tracheostomy placement with the patient's 1996 bypass surgery.     Jan 01, 2009--Last seen 3/09. At that time was felt to have cyclical cough, ACE was stopped and GERD prevention rx. He returns today back on ACE. Reports his symptoms did improve but over last several weeks now returning. C/o increased sob with any exertion - worse in am's - gurgling in upper chest. Minimally productive cough. no chest pain, Denies orthopnea, hemoptysis, fever, n/v/d, edema, headache,recent travel.   July 15, 2010 --Presents for an acute office visit. Complains of productive  cough with clear/brown mucus, wheezing, increased SOB x2weeks. Started with sinus drip/drainage. THen cough started.  Previously taken off ACE last ov. This has been restarted. He says he has done well w/ no cough until last 2 weeks.  Had recent CT and xray w/ no acute findings. Denies chest pain, orthopnea, hemoptysis, fever, n/v/d, edema, headache,.   August 09, 2010 3:24 PM This pt saw NP 11/18: for acute bronchitis.  Will awaken at night dyspneic.  Happens once a week.  Not much coughing with this.  Cough is better.  No real wheeze.  No mucus as per november.  no real heart burn at night.   This pt notes he is doing ok and tolerating ACE inhibitor well.  Preventive Screening-Counseling & Management  Alcohol-Tobacco     Smoking Status: quit     Packs/Day: 1.0     Year Started: 1949     Year Quit: 1989     Pack years: 20  Current  Medications (verified): 1)  Hytrin 5 Mg  Caps (Terazosin Hcl) .... Once Daily 2)  Zocor 80 Mg  Tabs (Simvastatin) .... Once Daily 3)  Lasix 40 Mg Tabs (Furosemide) .... Take 1 Tablet By Mouth Once A Day 4)  Combivent 103-18 Mcg/act Aero (Ipratropium-Albuterol) .... Use 1 Puff Two Times A Day 5)  Lisinopril 40 Mg Tabs (Lisinopril) .Marland Kitchen.. 1 By Mouth Daily 6)  Warfarin Sodium 5 Mg Tabs (Warfarin Sodium) .... Take As Directed 7)  Sotalol Hcl 80 Mg Tabs (Sotalol Hcl) .... Take 1 Tablet By Mouth Two Times A Day 8)  Amlodipine Besylate 5 Mg Tabs (Amlodipine Besylate) .... Take 1 Tablet By Mouth Once A Day  Allergies (verified): 1)  ! Tagamet 2)  ! Zantac  Past History:  Past medical, surgical, family and social histories (including risk factors) reviewed, and no changes noted (except as noted below).  Past Medical History: Reviewed history from 12/21/2007 and no changes required. 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 LUNG,HX. OF  Past Surgical History: Reviewed history from 12/21/2007 and no changes required. C A B G:1996 STOMACH SURG. MASTOID R. EAR  Family History: Reviewed history from 11/15/2007 and no changes required. Liver cancer in sister  Social History: Reviewed history from 11/15/2007 and no changes required.  Patient states former smoker.   Film/video editor and retired Packs/Day:  1.0 Pack years:  20  Review of Systems       The patient complains of shortness of breath with activity.  The patient denies shortness of breath at rest, productive cough, non-productive cough, coughing up blood, chest pain, irregular heartbeats, acid heartburn, indigestion, loss of appetite, weight change, abdominal pain, difficulty swallowing,  sore throat, tooth/dental problems, headaches, nasal congestion/difficulty breathing through nose, sneezing, itching, ear ache, anxiety, depression, hand/feet swelling, joint stiffness or pain, rash, change in color of mucus, and fever.    Vital Signs:  Patient profile:   76 year old male Height:      72 inches (182.88 cm) Weight:      196 pounds (89.09 kg) BMI:     26.68 O2 Sat:      97 % on Room air Temp:     97.6 degrees F (36.44 degrees C) oral Pulse rate:   78 / minute BP sitting:   152 / 80  (left arm) Cuff size:   regular  Vitals Entered By: Michel Bickers CMA (August 09, 2010 3:07 PM)  O2 Sat at Rest %:  97 O2 Flow:  Room air CC: Pt states cough has improved...c/o incr SOB when lying down at night...Dr. [name] put pt back on Lisinopril 40mg  once daily Comments Medications reviewed with patient Michel Bickers CMA  August 09, 2010 3:07 PM   Physical Exam  Additional Exam:  Gen: WD WN   WM       in NAD    NCAT Heent:  no jvd, no TMG, no cervical LNademopathy, orophyx clear,  nares with clear watery drainage. Cor: RRR nl s1/s2  no s3/s4  no m r h g Abd: soft NT BSA   no masses  No HSM  no rebound or guarding Ext perfused with no c v e v.d Neuro: intact, moves all 4s, CN II-XII intact, DTRs intact Chest:  coarse BS w/ no wheezing noted Skin: clear      Impression & Recommendations:  Problem # 1:  BRONCHITIS, ACUTE (ICD-466.0) Assessment Improved acute bronchitis improved overall. plan no further ABX indicated  The following medications were removed from the medication list:    Augmentin 875-125 Mg Tabs (Amoxicillin-pot clavulanate) .Marland Kitchen... 1 by mouth two times a day His updated medication list for this problem includes:    Combivent 103-18 Mcg/act Aero (Ipratropium-albuterol) ..... Use 1 puff two times a day  Orders: Est. Patient Level III (84696)  Medications Added to Medication List This Visit: 1)  Lisinopril 40 Mg Tabs (Lisinopril) .Marland Kitchen.. 1 by mouth  daily  Patient Instructions: 1)  No change in medications 2)  Return in       6   months  Clinical Reports Reviewed:  PFT's:  11/15/2007: FEF 25/75 %Predicted:  84 FEV1 %Predicted:  84 FVC %Predicted:  82     Appended Document: Pulmonary OV fax don moore

## 2010-10-11 ENCOUNTER — Ambulatory Visit (INDEPENDENT_AMBULATORY_CARE_PROVIDER_SITE_OTHER): Payer: Medicare Other | Admitting: Critical Care Medicine

## 2010-10-11 ENCOUNTER — Encounter: Payer: Self-pay | Admitting: Critical Care Medicine

## 2010-10-11 DIAGNOSIS — R05 Cough: Secondary | ICD-10-CM

## 2010-10-11 DIAGNOSIS — J209 Acute bronchitis, unspecified: Secondary | ICD-10-CM

## 2010-10-19 NOTE — Assessment & Plan Note (Signed)
Summary: Pulmonary OV   Primary Provider/Referring Provider:  Dr. [name]  CC:  1 month follow up.  Pt states he does have SOB with exertion but overall breathing is better.  Denies wheezing, chest tightness, and cough..  History of Present Illness: 77 years old male  with upper airway instability syndrome.  Pt taking ACE inhibitor despite our rec to avoid.     Past history is also pertinent in that patient was on mechanical ventilation for prolonged period time with associated tracheostomy placement with the patient's 1996 bypass surgery.     Jan 01, 2009--Last seen 3/09. At that time was felt to have cyclical cough, ACE was stopped and GERD prevention rx. He returns today back on ACE. Reports his symptoms did improve but over last several weeks now returning. C/o increased sob with any exertion - worse in am's - gurgling in upper chest. Minimally productive cough. no chest pain, Denies orthopnea, hemoptysis, fever, n/v/d, edema, headache,recent travel.   July 15, 2010 --Presents for an acute office visit. Complains of productive  cough with clear/brown mucus, wheezing, increased SOB x2weeks. Started with sinus drip/drainage. THen cough started.  Previously taken off ACE last ov. This has been restarted. He says he has done well w/ no cough until last 2 weeks.  Had recent CT and xray w/ no acute findings. Denies chest pain, orthopnea, hemoptysis, fever, n/v/d, edema, headache,.   August 09, 2010 3:24 PM This pt saw NP 11/18: for acute bronchitis.  Will awaken at night dyspneic.  Happens once a week.  Not much coughing with this.  Cough is better.  No real wheeze.  No mucus as per november.  no real heart burn at night.   This pt notes he is doing ok and tolerating ACE inhibitor well.September 09, 2010 3:19 PM onset this past week, much worse.  coughs,  not able to sleep.,  notes some wheezing.  no pndrip no f/c/s.  October 11, 2010 4:43 PM Now is better,  pt has less cough. There is  no excess mucus.  There is no chest pain noted.  There is less pn drip.  There is no dyspnea. The pt is off benicar and back on lisinopril with out worsening of cough  Current Medications (verified): 1)  Hytrin 5 Mg  Caps (Terazosin Hcl) .... Once Daily 2)  Zocor 80 Mg  Tabs (Simvastatin) .... Once Daily 3)  Lasix 40 Mg Tabs (Furosemide) .... Take 1 Tablet By Mouth Once A Day 4)  Lisinopril 40 Mg Tabs (Lisinopril) .... Take 1 Tablet By Mouth Once A Day 5)  Warfarin Sodium 5 Mg Tabs (Warfarin Sodium) .... Take As Directed 6)  Sotalol Hcl 80 Mg Tabs (Sotalol Hcl) .... Take 1 Tablet By Mouth Two Times A Day 7)  Amlodipine Besylate 5 Mg Tabs (Amlodipine Besylate) .... Take 1 Tablet By Mouth Once A Day 8)  Vitamin D 1000 Unit Tabs (Cholecalciferol) .... Take 1 Tablet By Mouth Once A Day  Allergies (verified): 1)  ! Tagamet 2)  ! Zantac  Past History:  Past medical, surgical, family and social histories (including risk factors) reviewed, and no changes noted (except as noted below).  Past Medical History: Reviewed history from 12/21/2007 and no changes required. 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 LUNG,HX. OF  Past Surgical History: Reviewed  history from 12/21/2007 and no changes required. C A B G:1996 STOMACH SURG. MASTOID R. EAR  Family History: Reviewed history from 11/15/2007 and no changes required. Liver cancer in sister  Social History: Reviewed history from 11/15/2007 and no changes required. Patient states former smoker.   Film/video editor and retired  Review of Systems  The patient denies shortness of breath with activity, shortness of breath at rest, productive cough, non-productive cough, coughing up blood, chest  pain, irregular heartbeats, acid heartburn, indigestion, loss of appetite, weight change, abdominal pain, difficulty swallowing, sore throat, tooth/dental problems, headaches, nasal congestion/difficulty breathing through nose, sneezing, itching, ear ache, anxiety, depression, hand/feet swelling, joint stiffness or pain, rash, change in color of mucus, and fever.    Vital Signs:  Patient profile:   77 year old male Height:      71 inches Weight:      192.13 pounds BMI:     26.89 O2 Sat:      96 % on Room air Temp:     98.2 degrees F oral Pulse rate:   88 / minute BP sitting:   140 / 70  (left arm) Cuff size:   regular  Vitals Entered By: Gweneth Dimitri RN (October 11, 2010 4:19 PM)  O2 Flow:  Room air CC: 1 month follow up.  Pt states he does have SOB with exertion but overall breathing is better.  Denies wheezing, chest tightness, cough. Comments Medications reviewed with patient Daytime contact number verified with patient. Gweneth Dimitri RN  October 11, 2010 4:19 PM    Physical Exam  Additional Exam:  Gen: WD WN   WM       in NAD    NCAT Heent:  no jvd, no TMG, no cervical LNademopathy, orophyx clear,  nares  clear Cor: RRR nl s1/s2  no s3/s4  no m r h g Abd: soft NT BSA   no masses  No HSM  no rebound or guarding Ext perfused with no c v e v.d Neuro: intact, moves all 4s, CN II-XII intact, DTRs intact Chest:  coarse BS w/ no wheezing noted Skin: clear      Impression & Recommendations:  Problem # 1:  BRONCHITIS, ACUTE (ICD-466.0) Assessment Improved resolved acute bronchitis plan  as needed combivent  no further antibiotics  Ok to resume ACE inhibitor , pt to call if cough recurs on lisinopril The following medications were removed from the medication list:    Combivent 103-18 Mcg/act Aero (Ipratropium-albuterol) ..... Use 1 puff two times a day  Orders: Est. Patient Level III (09811)  Medications Added to Medication List This Visit: 1)  Lisinopril 40 Mg Tabs  (Lisinopril) .... Take 1 tablet by mouth once a day 2)  Vitamin D 1000 Unit Tabs (Cholecalciferol) .... Take 1 tablet by mouth once a day  Complete Medication List: 1)  Hytrin 5 Mg Caps (Terazosin hcl) .... Once daily 2)  Zocor 80 Mg Tabs (Simvastatin) .... Once daily 3)  Lasix 40 Mg Tabs (Furosemide) .... Take 1 tablet by mouth once a day 4)  Lisinopril 40 Mg Tabs (Lisinopril) .... Take 1 tablet by mouth once a day 5)  Warfarin Sodium 5 Mg Tabs (Warfarin sodium) .... Take as directed 6)  Sotalol Hcl 80 Mg Tabs (Sotalol hcl) .... Take 1 tablet by mouth two times a day 7)  Amlodipine Besylate 5 Mg Tabs (Amlodipine besylate) .... Take 1 tablet by mouth once a day 8)  Vitamin D 1000 Unit Tabs (Cholecalciferol) .Marland KitchenMarland KitchenMarland Kitchen  Take 1 tablet by mouth once a day  Patient Instructions: 1)  Return as needed 2)  Call if you start coughing or clearing throat again, we may have to take a second look at lisinopril  Appended Document: Pulmonary OV fax don moore

## 2010-11-12 ENCOUNTER — Emergency Department (HOSPITAL_COMMUNITY)
Admission: EM | Admit: 2010-11-12 | Discharge: 2010-11-12 | Disposition: A | Discharge status: Home / Self Care | Payer: Medicare Other | Attending: Emergency Medicine | Admitting: Emergency Medicine

## 2010-11-12 ENCOUNTER — Emergency Department (HOSPITAL_COMMUNITY): Payer: Medicare Other

## 2010-11-12 DIAGNOSIS — R0989 Other specified symptoms and signs involving the circulatory and respiratory systems: Secondary | ICD-10-CM | POA: Insufficient documentation

## 2010-11-12 DIAGNOSIS — R0609 Other forms of dyspnea: Secondary | ICD-10-CM | POA: Insufficient documentation

## 2010-11-12 DIAGNOSIS — J4489 Other specified chronic obstructive pulmonary disease: Secondary | ICD-10-CM | POA: Insufficient documentation

## 2010-11-12 DIAGNOSIS — I10 Essential (primary) hypertension: Secondary | ICD-10-CM | POA: Insufficient documentation

## 2010-11-12 DIAGNOSIS — R0602 Shortness of breath: Secondary | ICD-10-CM | POA: Insufficient documentation

## 2010-11-12 DIAGNOSIS — R05 Cough: Secondary | ICD-10-CM | POA: Insufficient documentation

## 2010-11-12 DIAGNOSIS — E785 Hyperlipidemia, unspecified: Secondary | ICD-10-CM | POA: Insufficient documentation

## 2010-11-12 DIAGNOSIS — R490 Dysphonia: Secondary | ICD-10-CM | POA: Insufficient documentation

## 2010-11-12 DIAGNOSIS — Z7901 Long term (current) use of anticoagulants: Secondary | ICD-10-CM | POA: Insufficient documentation

## 2010-11-12 DIAGNOSIS — Z79899 Other long term (current) drug therapy: Secondary | ICD-10-CM | POA: Insufficient documentation

## 2010-11-12 DIAGNOSIS — Z951 Presence of aortocoronary bypass graft: Secondary | ICD-10-CM | POA: Insufficient documentation

## 2010-11-12 DIAGNOSIS — J449 Chronic obstructive pulmonary disease, unspecified: Secondary | ICD-10-CM | POA: Insufficient documentation

## 2010-11-12 DIAGNOSIS — I252 Old myocardial infarction: Secondary | ICD-10-CM | POA: Insufficient documentation

## 2010-11-12 DIAGNOSIS — R059 Cough, unspecified: Secondary | ICD-10-CM | POA: Insufficient documentation

## 2010-11-12 LAB — DIFFERENTIAL
Basophils Absolute: 0 10*3/uL (ref 0.0–0.1)
Eosinophils Absolute: 0.3 10*3/uL (ref 0.0–0.7)
Eosinophils Relative: 4 % (ref 0–5)
Lymphs Abs: 2.5 10*3/uL (ref 0.7–4.0)

## 2010-11-12 LAB — CBC
MCV: 87.5 fL (ref 78.0–100.0)
Platelets: 141 10*3/uL — ABNORMAL LOW (ref 150–400)
RDW: 13.4 % (ref 11.5–15.5)
WBC: 7.5 10*3/uL (ref 4.0–10.5)

## 2010-11-12 LAB — COMPREHENSIVE METABOLIC PANEL
Albumin: 3.9 g/dL (ref 3.5–5.2)
Alkaline Phosphatase: 54 U/L (ref 39–117)
BUN: 20 mg/dL (ref 6–23)
Calcium: 9 mg/dL (ref 8.4–10.5)
Potassium: 4.2 mEq/L (ref 3.5–5.1)
Total Protein: 7.1 g/dL (ref 6.0–8.3)

## 2010-11-12 LAB — PROTIME-INR: INR: 2.13 — ABNORMAL HIGH (ref 0.00–1.49)

## 2010-11-12 LAB — BRAIN NATRIURETIC PEPTIDE: Pro B Natriuretic peptide (BNP): 642 pg/mL — ABNORMAL HIGH (ref 0.0–100.0)

## 2010-12-27 ENCOUNTER — Other Ambulatory Visit: Payer: Self-pay | Admitting: Cardiothoracic Surgery

## 2010-12-27 DIAGNOSIS — R911 Solitary pulmonary nodule: Secondary | ICD-10-CM

## 2011-01-04 ENCOUNTER — Ambulatory Visit (INDEPENDENT_AMBULATORY_CARE_PROVIDER_SITE_OTHER): Payer: Medicare Other | Admitting: Adult Health

## 2011-01-04 ENCOUNTER — Encounter: Payer: Self-pay | Admitting: Adult Health

## 2011-01-04 VITALS — BP 140/68 | HR 72 | Temp 97.2°F | Ht 71.0 in | Wt 189.6 lb

## 2011-01-04 DIAGNOSIS — R05 Cough: Secondary | ICD-10-CM

## 2011-01-04 DIAGNOSIS — J209 Acute bronchitis, unspecified: Secondary | ICD-10-CM

## 2011-01-04 MED ORDER — LEVALBUTEROL HCL 0.63 MG/3ML IN NEBU
0.6300 mg | INHALATION_SOLUTION | Freq: Once | RESPIRATORY_TRACT | Status: AC
  Administered 2011-01-04: 0.63 mg via RESPIRATORY_TRACT

## 2011-01-04 MED ORDER — PREDNISONE 10 MG PO TABS: ORAL_TABLET | ORAL | Status: AC

## 2011-01-04 NOTE — Progress Notes (Signed)
Subjective:    Patient ID: [name] [name], male    DOB: [birth year], 77 y.o.   MRN: [id number]  HPI 77 years old male with upper airway instability syndrome. Pt taking ACE inhibitor despite our rec to avoid.  Past history is also pertinent in that patient was on mechanical ventilation for prolonged period time with associated tracheostomy placement with the patient's 1996 bypass surgery.   Jan 01, 2009--Last seen 3/09. At that time was felt to have cyclical cough, ACE was stopped and GERD prevention rx. He returns today back on ACE. Reports his symptoms did improve but over last several weeks now returning. C/o increased sob with any exertion - worse in am's - gurgling in upper chest. Minimally productive cough. no chest pain, Denies orthopnea, hemoptysis, fever, n/v/d, edema, headache,recent travel.   July 15, 2010 --Presents for an acute office visit. Complains of productive cough with clear/brown mucus, wheezing, increased SOB x2weeks. Started with sinus drip/drainage. THen cough started.  Previously taken off ACE last ov. This has been restarted. He says he has done well w/ no cough until last 2 weeks. Had recent CT and xray w/ no acute findings. Denies chest pain, orthopnea, hemoptysis, fever, n/v/d, edema, headache,.   August 09, 2010 3:24 PM  This pt saw NP 11/18: for acute bronchitis. Will awaken at night dyspneic. Happens once a week. Not much coughing with this. Cough is better. No real wheeze. No mucus as per november. no real heart burn at night. This pt notes he is doing ok and tolerating ACE inhibitor well.  September 09, 2010 3:19 PM  onset this past week, much worse. coughs, not able to sleep., notes some wheezing. no pndrip  no f/c/s.   October 11, 2010 4:43 PM  Now is better, pt has less cough. There is no excess mucus. There is no chest pain noted. There is less pn drip. There is no dyspnea.  The pt is off benicar and back on lisinopril with out worsening of cough   59/12  Acute OV  Complains of c/o SOB worse this am - Mostly with sitting still not much with exertion - Wheezing - Chest tightness. Over last week, has had dry cough with wheezing. Worse today. He is now off ACE inhibitor back on Benicar.  NO otc used. Has been using his rescue inhaler last 2 days.    Review of Systems Constitutional:   No  weight loss, night sweats,  Fevers, chills   HEENT:   No headaches,  Difficulty swallowing,  Tooth/dental problems, or  Sore throat,                No sneezing, itching, ear ache, nasal congestion, post nasal drip,   CV:  No chest pain,  Orthopnea, PND, swelling in lower extremities, anasarca, dizziness, palpitations, syncope.   GI  No heartburn, indigestion, abdominal pain, nausea, vomiting, diarrhea, change in bowel habits, loss of appetite, bloody stools.   Resp:  No coughing up of blood.  No change in color of mucus.    No chest wall deformity  Skin: no rash or lesions.  GU: no dysuria, change in color of urine, no urgency or frequency.  No flank pain, no hematuria   MS:  No joint pain or swelling.  No decreased range of motion.  No back pain.  Psych:  No change in mood or affect. No depression or anxiety.  No memory loss.          Objective:  Physical Exam GEN: A/Ox3; pleasant , NAD, elderly  HEENT:  /AT,  EACs-clear, TMs-wnl, NOSE-clear, THROAT-clear, no lesions, no postnasal drip or exudate noted.   NECK:  Supple w/ fair ROM; no JVD; normal carotid impulses w/o bruits; no thyromegaly or nodules palpated; no lymphadenopathy.  RESP  Coarse BS with few exp wheezes   CARD:  RRR, no m/r/g  , no peripheral edema, pulses intact, no cyanosis or clubbing.  GI:   Soft & nt; nml bowel sounds; no organomegaly or masses detected.  Musco: Warm bil, no deformities or joint swelling noted.   Neuro: alert, no focal deficits noted.    Skin: Warm, no lesions or rashes         Assessment & Plan:

## 2011-01-04 NOTE — Patient Instructions (Signed)
Prednisone taper over next week.  Mucinex DM Twice daily  As needed  Cough/congestion  Fluids and rest.  Please contact office for sooner follow up if symptoms do not improve or worsen or seek emergency care  follow up Dr. Delford Field in 6 weeks

## 2011-01-08 NOTE — Assessment & Plan Note (Signed)
Cough flare:   Prednisone taper over next week.  Mucinex DM Twice daily  As needed  Cough/congestion  Fluids and rest.  Please contact office for sooner follow up if symptoms do not improve or worsen or seek emergency care  follow up Dr. Delford Field in 6 weeks

## 2011-01-10 NOTE — Assessment & Plan Note (Signed)
OFFICE VISIT   [name] [name], [name] [name]  DOB:  1925/11/14                                        February 14, 2007  CHART #:  [id number]   REASON FOR VISIT:  Followup of thoracic aneurysm by a CT scan.   HISTORY OF PRESENT ILLNESS:  The patient had previously undergone  coronary artery bypass grafting in 1996, and had a long post operative  course including 59 days on the ventilator.  He was seen in 2006,  because of a dilated ascending aorta approximately 5.4 cm and several  nondescript lung nodules, particularly a 9 mm non-calcified nodule in  the left lower lobe.  Because of the patient's  overall general health  and redo status, reoperation to replace his ascending aorta had not been  encouraged and the patient has been followed since that time.  He  returns today with a followup CT scan.   PHYSICAL EXAMINATION:  Vital Signs:  Blood pressure is 111/60, pulse is  69 and regular, respiratory rate 18, O2 SATS 97%.  General:  The patient  is chronically ill appearing, but alert and oriented and able to  function in his usual daily activities.  He does have exertional  shortness of breath.  He notes that he had a stress test in February and  an abdominal ultrasound.   RADIOLOGY STUDIES:  Followup CT scan was performed and read by Dr.  Vear Clock; it was the 9 mm, non-calcified nodules unchanged over the past  three years and consistent with old granulomatous disease.  The  ascending aorta is stabilized and has not changed since 2006, and  remains approximately 5.4 cm in size.   Because of the patient's significant underlying pulmonary disease; redo  status and advancing age, I am reluctant to recommend to him that we  replace his ascending aorta. We will plan to see him back in one year  with a followup CT scan.   Sheliah Plane, MD  Electronically Signed   EG/MEDQ  D:  02/14/2007  [name]:  02/15/2007  Job:  [id number]   cc:   Gerlene Burdock A. [name], M.D.

## 2011-01-10 NOTE — Op Note (Signed)
NAME:  [name] [name], [name] [name]                  ACCOUNT NO.:  [id number]   MEDICAL RECORD NO.:  [id number]          PATIENT TYPE:  OIB   LOCATION:  2899                         FACILITY:  [facility]   PHYSICIAN:  Richard A. [name], M.D.DATE OF BIRTH:  February 21, 1926   DATE OF PROCEDURE:  DATE OF DISCHARGE:  02/18/2009                               OPERATIVE REPORT   PROCEDURE:  DC cardioversion, outpatient.   Mr. Litton is an 77 year old white widowed father of 4 with 5 PC and 6 TGC  who quit smoking in 1989.  He has had remote CABG in 1996 complicated by  postoperative ARDS with prolonged intubation and recovery but eventually  recovered well.  He had COPD.  He had patent grafts when studied in June  2004 and has not required re-cath since that time, and he is without any  angina or recent nitroglycerin use.  He has mild LV dysfunction with EF  about 45% at last cath and on 2D echo of 2009.  He has a known thoracic  ascending aortic aneurysm followed by Dr. Tyrone Sage, which is  asymptomatic.  He has asymptomatic noncalcified pulmonary nodules, and  his aortic aneurysm is 5.7 x 5.7 cm.  He is due to see Dr. Tyrone Sage in  followup next month.  He has a history of hypertension and he is on  bronchodilator therapy for COPD.   He history of AF dates back to 2008 when he had DC cardioversion which  did not hold.  This was done in December 2008.  He is predominantly  limited by exertional dyspnea related to his COPD without overt heart  failure.  He had been on chronic Coumadin for his AF and was tolerating  this well.  He developed increasing symptoms felt to be related to his  atrial fibrillation with loss of AV synchrony.  We did not want to use  amiodarone because of his pulmonary disease, and he was started on  Multaq as an outpatient for both, which he has tolerated well.  He  actually held sinus rhythm from December 2008 until June 2009 and then  had recurrent atrial fibrillation with progressive  symptoms prompting  attempt at repeat DC cardioversion on different antiarrhythmic Multaq at  this time.   Informed consent was obtained by the patient to proceed with  cardioversion.  Outpatient INRs have been therapeutic and well  documented in the chart.  Recent laboratory of February 12, 2009, showed BUN  and creatinine of 15/1.4, nonfasting glucose of 119.  He does have  hyperlipidemia on 80 of Zocor with LDL of 133, and cholesterol of 229,  and he may need a more potent statin therapy.  BNP was slightly elevated  at 204.   Cardiolite was negative for ischemia showing scar March 2008.  Last 2D  echo of October 2009 showed a left atrial size of 5 cm.   The patient was in postabsorptive state.  He was attended to by Dr.  Sampson Goon from Anesthesia.  He underwent synchronized DC cardioversion  with AP paddles under 70 mg of Diprivan IV anesthesia.  He  had a  biphasic shock  of 120 joules converting him to sinus rhythm with APCs at a rate of 70  per minute.  A 12-lead EKG showed sinus rhythm; PR interval of 0.208;  poor R-wave progression, V1 through V3, unchanged, occasional PVC, and  QRS duration of 0.108 with nonspecific ST-T changes, unchanged,  increased voltage compatible with LVH.      Richard A. [name], M.D.  Electronically Signed     Richard A. [name], M.D.  Electronically Signed    RAW/MEDQ  D:  02/18/2009  T:  02/19/2009  Job:  [id number]   cc:   Sheliah Plane, MD  Ernestina Penna, M.D.  Charlcie Cradle Delford Field, MD, FCCP

## 2011-01-10 NOTE — Assessment & Plan Note (Signed)
[facility]                         GASTROENTEROLOGY OFFICE NOTE   NAME:Bocanegra, TARAN HAYNESWORTH                         MRN:          [id number]  DATE:01/04/2007                            DOB:          Aug 15, 1926    [name] [name] is doing fine except for some nausea which has come about over the  last week and is usually in the morning. He has had no emesis, change in  bowel habits or systemic complaints. His previous GI workup was  unremarkable except for some NSAID-induced gastritis and chronic acid  reflux for which he takes Protonix 40 mg a day and Carafate after meals  and at bedtime. He has had a gallbladder ultrasound, endoscopy, and  colonoscopy within the last 2 years which have been negative. He denies  the use of alcohol or NSAIDs. His appetite is good and his weight is  stable.   PHYSICAL EXAMINATION:  He weighs 201 pounds which is his normal weight.  Blood pressure is 160/72, pulse was 64 and regular.  Mental status was normal and I could not appreciate stigmata of chronic  liver disease.  CHEST:  Clear.  HEART:  He was in a regular rhythm without murmur, gallop or rub.  ABDOMEN:  There was no hepatosplenomegaly, abdominal masses, tenderness,  bowel sounds were normal, and there was no succussion splash noted.  RECTAL:  Deferred.   ASSESSMENT:  Kamarian has nonspecific nausea perhaps related to viral  syndrome and mild gastroparesis. He certainly does not appear to be ill  at this time.   RECOMMENDATIONS:  1. Check screening laboratory parameters.  2. Continue above mentioned medications with p.r.n. Phenergan 12.5 mg      every 6-8 hours.  3. Trial of Align probiotic therapy 1 a day for 2 weeks.  4. GI followup in 1 month's time.     Vania Rea. Jarold Motto, MD, Caleen Essex, FAGA  Electronically Signed    DRP/MedQ  DD: 01/04/2007  DT: 01/04/2007  Job #: [id number]   cc:   Ernestina Penna, M.D.

## 2011-01-10 NOTE — Assessment & Plan Note (Signed)
[facility]                         GASTROENTEROLOGY OFFICE NOTE   NAME:[name] [name]                         MRN:          [id number]  DATE:10/25/2007                            DOB:          [birth year]    Mr. Patriarca complains of intermittent black, tarry stools.  He has had his  stools checked, both at [facility] and in the [facility]  Emergency Room within the last several weeks and they have been  repeatedly guaiac-negative with a normal hemoglobin.  [name] is on  Protonix 40 mg a day chronically for acid reflux symptoms and last had  an endoscopy in September of 2007.  He does have a large hiatal hernia  and chronic GERD with recurrent peptic strictures of his esophagus, but  he currently is asymptomatic.  He says his bowels are moving well and he  denies bright red blood per rectum.   On speaking with Mr. Pecina, he does use beets almost daily, which I  suspect is giving his stool a dark color.  He denies use of iron tablets  or Pepto Bismol.  He has no symptoms of hypovolemia and no exacerbation  of his cardiovascular problems.  He is on multiple antihypertensive  medications and is followed by Dr. [name] and Dr. [name].   He is awake and alert, in no acute distress.  He weighs 194 pounds and blood pressure is 140/80 and pulse was 72 and  regular.  His chest was clear and he appeared to be in a regular rhythm without  murmurs, gallops or rubs.  His abdomen was unremarkable, without organomegaly, masses or  tenderness.  Inspection of his rectum was unremarkable, as was rectal exam with soft  stool that was normal in color and guaiac-negative.   ASSESSMENT:  1. Chronic GERD on daily PPI therapy.  2. Intermittent dark, black stool, probably related to beets and other      dietary substances.  3. Negative colonoscopy exam in December of 2006.  4. Hypertensive cardiovascular disease and hyperlipidemia.    RECOMMENDATIONS:  1. Continue all medications per Dr. Christell Constant, including daily PPI      therapy.  2. Continue chronic reflux regimen.  3. Repeat CBC and iron studies.     Vania Rea. Jarold Motto, MD, Caleen Essex, FAGA  Electronically Signed    DRP/MedQ  DD: 10/25/2007  DT: 10/25/2007  Job #: [id number]   cc:   Ernestina Penna, M.D.

## 2011-01-10 NOTE — H&P (Signed)
NAME:  [name] [name], [name] [name]                  ACCOUNT NO.:  [id number]   MEDICAL RECORD NO.:  [id number]          PATIENT TYPE:  OIB   LOCATION:  2899                         FACILITY:  [facility]   PHYSICIAN:  Richard A. [name], M.D.DATE OF BIRTH:  [birth year]   DATE OF ADMISSION:  07/30/2007  DATE OF DISCHARGE:                              HISTORY & PHYSICAL   Please see history and physical of June 21, 2007, for more complete  details.   CHIEF COMPLAINT:  Atrial fibrillation with fatigue and shortness of  breath with exertion.   HISTORY OF PRESENT ILLNESS:  A 77 year old, white male followed by Dr.  [name] with coronary disease and PAF.  He has had recurrent atrial  fibrillation.  He was placed on Coumadin, and his Betapace was  increased.  He continues with dyspnea on exertion but otherwise unaware  of his AFib.  It was felt with the increase of the Betapace, we would to  bring him in, do an outpatient cardioversion if he was therapeutic for 4  weeks, and hopefully get him back into a sinus rhythm.   PAST MEDICAL HISTORY:  1. PAF.  2. Coronary disease with bypass grafting in 1996 with patent grafts in      June 2004 and low risk Myoview in March 2008.  EF 30-35% by echo of      December 2007.  3. The patient has a known thoracic aneurysm followed by Dr. Tyrone Sage      5.6 x 5.5 cm, treated hypertension, chronic renal insufficiency      with a creatinine 16.  He is on Coumadin.  4. Treated hypertension.  5. Treated dyslipidemia.   FAMILY HISTORY:  Not significant to this procedure.   SOCIAL HISTORY:  Married, 4 children, 5 grandchildren.  Does not use  tobacco.  He quit in 1989.   ALLERGIES:  INTOLERANT OR ALLERGIC TO TAGAMET AND ZANTAC.   OUTPATIENT MEDICATIONS:  1. Lisinopril 40 b.i.d.  2. Norvasc 10 a day.  3. Betapace 80 b.i.d.  4. Hytrin 10 at h.s.  5. Zocor 80 at h.s.  6. Diovan 80 daily.  7. Coumadin as directed.  8. Lasix 20 mg a day.  9. Potassium 20 mEq a  day.   REVIEW OF SYSTEMS TODAY:  HEENT:  No headaches.  No sinus congestion.  No recent colds.  GENERAL:  No weight changes.  PULMONARY:  Short of breath with exertion and has chronic shortness of  breath.  CARDIOVASCULAR:  Denies chest pain.  GI:  No diarrhea, constipation, or melena.  GU:  No hematuria or dysuria, although he does have a history of BPH.  ENDOCRINE:  No diabetes.  His TSH was normal recently.  MUSCULOSKELETAL:  No complaints of leg pains.  NEURO:  No syncope.   PHYSICAL EXAMINATION:  VITAL SIGNS:  Blood pressure today 160/78, pulse  62, respiratory rate 16.  Temperature is 97.8.  Oxygen saturation on  room air 97%.  GENERAL:  Alert and oriented white male in no acute distress.  SKIN:  Warm and dry.  Brisk capillary refills.  Sclerae  are clear.  NECK:  Supple.  No JVD.  No bruits.  No thyromegaly.  No adenopathy.  LUNGS:  Clear without rales, rhonchi, or wheezes.  HEART:  Irregularly irregular without obvious murmur, gallop, rub, or  click.  ABDOMEN:  Soft and nontender.  Positive bowel sounds.  Do not palpate  liver, spleen, or masses.  EXTREMITIES:  Without edema.  Pedal pulses are present.  NEURO:  Alert and oriented x3.  Follows commands, answers questions  appropriately.   ASSESSMENT:  1. Paroxysmal atrial fibrillation with complaint of dyspnea on      exertion and a history of bradycardia in the past here today for      cardioversion by Dr. [name] and Anesthesia.  2. Known coronary disease with bypass grafting in 1996 and patent      grafts in June 2004.  Low risk Myoview in March 2008.  EF 20-35% in      December 2007.  3. Thoracic aneurysm followed by Dr. Tyrone Sage.  4. Treated hypertension.  5. Chronic renal insufficiency with creatinine 1.6.  6. Anticoagulation with Coumadin.   PLAN:  Cardioversion today.  If he tolerates well, plan to let him go  home once he is awake from anesthesia after he is cardioverted.      Darcella Gasman. [place],  N.P.      Richard A. [name], M.D.  Electronically Signed    LRI/MEDQ  D:  07/30/2007  T:  07/30/2007  Job:  [id number]   cc:   Gerlene Burdock A. [name], M.D.  Ernestina Penna, M.D.

## 2011-01-10 NOTE — Assessment & Plan Note (Signed)
OFFICE VISIT   [name] [name]  DOB:  [birth year]                                        February 13, 2008  CHART #:  [id number]   REASON FOR VISIT:  Followup visit for ascending aortic aneurysm, status  post coronary artery bypass grafting, 01/03/1995.   HISTORY OF PRESENT ILLNESS:  The patient presents with repeat CT scan to  evaluate his ascending aorta.  He had initially underwent coronary  artery bypass grafting in 1996 and had a long postoperative course  including 59 days on a ventilator.  In 2006, he was seen because of  dilatation of  ascending aorta, approximately 5.4 cm, and several  nondescript lung nodules.  Since that time, I am following, but because  of his underlying pulmonary function and overall poor health status,  further surgical intervention has not been recommended.  The patient  does note since I saw him last year, he has been cardioverted once  because of episodes of atrial fibrillation and has been started on  Coumadin.  In addition, he notes that his wife died suddenly after  complication of needle biopsy of the lung in February after 60 years of  marriage.   PHYSICAL EXAMINATION:  Vital Signs:  The patient's blood pressure is  110/64, pulse is 74 and regular, respiratory rate 20, and O2 sats 97%.  The patient is neurologically intact, though appears fairly frail.  Lungs:  Clear bilaterally.  Cardiac:  Regular rate and rhythm without  murmur or gallop.  Abdomen:  Benign.  Extremities:  He has 2+ edema in  the right lower leg, none in the left.   MEDICATIONS:  He continues on lisinopril, Zocor, Hytrin, amlodipine,  Proscar, lorazepam, sotalol, vitamin B12, potassium supplementation, and  Coumadin, and is followed by Dr. [name].   IMAGING STUDIES:  A CT scan of the chest was done that shows the lung  nodules to be stable without change over the past year.  The ascending  aorta has changed very slightly from 5.65 x 5.5 to  approximately 5.7.   IMPRESSION:  Very slight, if any, change of the ascending aortic  aneurysm in a frail almost 77 year old male with previous history of  coronary artery bypass grafting complicated by long complicated  respiratory postop course.  At this point, I discussed with the patient  the risks of both surgery and potential rupture and/or dissection and  weighing the risks and options, the patient prefers to continue with the  non-operative course.   PLAN:  I plan to see him back in 1 year with a followup CT scan.   Sheliah Plane, MD  Electronically Signed   EG/MEDQ  D:  02/13/2008  [name]:  02/13/2008  Job:  [id number]   cc:   Gerlene Burdock A. [name], M.D.

## 2011-01-10 NOTE — Assessment & Plan Note (Signed)
OFFICE VISIT   [name] [name], [name] [name]  DOB:  1925-11-29                                        March 02, 2009  CHART #:  [id number]   The patient returns to office today for followup visit.  Regarding  ascending aortic dilatation and left lung nodule, status post coronary  artery bypass grafting in 1996.  The patient had a repeat CT scan to  evaluate the size of his aorta.  He originally underwent coronary artery  bypass grafting in 1996 and had a long postoperative course including 59  days on ventilator.  In 2006 he was seen for dilatation of his ascending  aorta to 5.4 cm and several nondescript lung nodule.  Since that time we  had serial CT scans and showed over a 5-year period, a very slight  increase to now approximately 5.8 cm.  The patient had intermittent  atrial fibrillation over the past several years and is now on Coumadin.  He notes that last week, he had cardioversion and has an appointment to  see Dr. [name] tomorrow for followup.  He currently notes that he has  had increasing difficulty ambulating, keeping his balance over the past  several years but does still cut his grass with a riding lawn mower and  recently visited [place] for grandson's wedding.   PHYSICAL EXAMINATION:  GENERAL:  The patient appears his stated age of  23 years.  VITAL SIGNS:  Blood pressure 130/78, pulse is regular at 82, respiratory  rate 16, and O2 sats 97%.  NEUROLOGIC:  Intact.  CARDIAC:  He has no murmur or gallop, specifically there is no murmur of  aortic insufficiency.  EXTREMITIES:  He has very slight ankle edema bilaterally.  ABDOMINAL:  Benign without palpable masses or palpable enlargement of  the aorta.   His current medications are reviewed.  He continues on lisinopril,  Zocor, Hytrin, amlodipine, Proscar, lorazepam, sotalol, vitamin B12, and  Coumadin.  The patient's imaging studies are reviewed.  The left lower  lobe nodule has been stable for 4 years  and is considered benign.  The  ascending aorta is stable, essentially unchanged from exam previously at  about 5.8 cm.   I have again discussed with the patient with his redo status age of 73  years including the pros and cons of replacement of his ascending aorta  electively.  His primary concern is the difficulty of recovering from  his previous cardiac surgical procedure and now is more advanced age.  He can tend to follow up in 1 year with a CT scan and not embark on  replacement of his ascending aorta.  I concur with his assessment and I  will see him back in 1 year.   Sheliah Plane, MD  Electronically Signed   EG/MEDQ  D:  03/02/2009  [name]:  03/03/2009  Job:  [id number]   cc:   Gerlene Burdock A. [name], M.D.

## 2011-01-10 NOTE — Assessment & Plan Note (Signed)
OFFICE VISIT   [name] [name], [name] [name]  DOB:  1926/04/27                                        February 17, 2010  CHART #:  [id number]   HISTORY:  The patient returns for followup visit.  He has originally  underwent coronary artery bypass grafting on December 04, 1994.  At that  time, he had coronary artery bypass grafting x7.  He developed  postoperative respiratory failure, requiring tracheostomy, and  ultimately discharged home after 54 days in the hospital, but over the  past 15 years, he has done remarkably well.  He has developed atrial  fibrillation.  In 2006, he was noted on a CT scan to have several  nondescript lung nodules that ultimately have been stable and thought to  be benign and his ascending aorta was noted to be dilated to 5.4 cm.  Since that time, we have intermittently obtained CT scans of the chest  to evaluate the size of his aorta.  The patient now has significant  underlying pulmonary disease, though he notes that he is able to  function reasonably well.  He is now almost 77 years old, continues on  Coumadin.  He has not been smoking for many years.   PHYSICAL EXAMINATION:  Today, his blood pressure 150/75, pulse is 68,  respiratory rate is 18, and O2 sats 96%.  His breath sounds are distant,  but without active wheezing.  Sternum is stable and well healed.  Abdominal exam is without palpable masses.  I do not feel any dilatation  of his abdominal aorta.  He has no pedal edema.   DIAGNOSTIC TESTS:  CT scan was performed that shows the aorta to be  unchanged from the scan when almost exactly 1 year ago at 5.6 cm.   IMPRESSION:  At this point, when the frank discussion with the patient  in his age of almost 81 years, he prefers to continue to follow this.  As it is not changing, I cannot disagree with him  especially with his age of 76 years, redo status, and significant  underlying pulmonary disease. I do plan to see him back in 1 year.   Sheliah Plane, MD  Electronically Signed   EG/MEDQ  D:  02/17/2010  [name]:  02/17/2010  Job:  [id number]   cc:   Gerlene Burdock A. [name], M.D.

## 2011-01-10 NOTE — Cardiovascular Report (Signed)
NAME:  [name] [name], [name] [name]                  ACCOUNT NO.:  [id number]   MEDICAL RECORD NO.:  [id number]          PATIENT TYPE:  OIB   LOCATION:  2899                         FACILITY:  [facility]   PHYSICIAN:  Richard A. [name], M.D.DATE OF BIRTH:  11/24/1925   DATE OF PROCEDURE:  DATE OF DISCHARGE:  07/30/2007                            CARDIAC CATHETERIZATION   HISTORY:  [name] [name] is a 77 year old married father of 4 with 5  grandchildren.  He is a remote smoker who quit in 1989.  He is admitted  at the same day outpatient for elective DC cardioversion.  He has been  in atrial fibrillation since September 2008.  He has been symptomatic  with exercise intolerance and fatigue.   He has a history of remote CABG for multivessel coronary disease in 1996  by Dr. Tyrone Sage.  Unfortunately, he had postoperative ARDS with several  months hospitalization, but eventually recovered well.  He has had  patent grafts in 2004 and recatheterization and no recurrent angina.  EF  on Myoview March 2008, was 45%.  He developed atrial fibrillation as an  outpatient.  He has associated hypertension and a known thoracic  aneurysm that is followed by Dr. Tyrone Sage.  He has hypertension,  hyperlipidemia, mild renal insufficiency.  He was started on beta paste  as an outpatient, which has been titrated to 80 b.i.d., which he has  tolerated well and he has been on Coumadin for the last 3 months pending  elective DC cardioversion.   An informed consent was obtained to proceed with DC cardioversion.  INRs  have been therapeutic as an outpatient since Coumadin was begun  September 2008.  It should be noted that the patient did have a history  of PAF in 2004.   Preoperative laboratory showed an H&H of 14.2/40.7.  BUN and creatinine  of 17/1.5 and potassium of 3.9.   The patient was in stable clinical condition.  He was brought to the  outpatient holding area.  Under the direction of Dr. Jacklynn Bue from  anesthesia, he was  given three 225 mg of IV pentathol in the  postabsorptive state for sedation.  Biphasic synchronized external DC  counter shock was done with a single shock at 125 joules.  He converted  to sinus rhythm at 65 per minute.  The patient awoke in the recovery  room with no sequela and normal neurologic exam and stable blood  pressure, which ranged 135 to 150 during the procedure.   EKG shows sinus bradycardia, PR interval with first degree heart block,  PR interval 0.234, QRS duration 0.106.  For Q waves compatible with old  BMI, QSB1 compatible with old anteroseptal MI, and nonspecific ST-T  changes.   The plan is to continue antiarrhythmic therapy with beta paste since he  is tolerating this and continued Coumadin therapy.  He will be followed  up as an outpatient.  We will probably go ahead and redo a 2D echo if he  maintains sinus rhythm and has a chance to regain effective atrial  contraction to assess his LV function.  He will  be continued to be  followed cardiac wise by me and he is followed by Dr. Sheliah Plane  (last seen February 14, 2007) for his 5.4 cm asymptomatic ascending aortic  aneurysm.      Richard A. [name], M.D.  Electronically Signed    RAW/MEDQ  D:  07/30/2007  T:  07/31/2007  Job:  [id number]

## 2011-01-13 NOTE — Assessment & Plan Note (Signed)
[facility]                           GASTROENTEROLOGY OFFICE NOTE   NAME:[name] [name], [name] [name]                         MRN:          [id number]  DATE:04/06/2006                            DOB:          04/07/1926    Mr. President is a elderly white male that I have been seeing for many years  because of acid reflux disease, recurrent dysphagia.  He recently had  endoscopy and diltation in December of 2006 and is on chronic daily Protonix  therapy.   He comes today to the office complaining of a weeks history discomfort of a  spasmodic nature in the left upper quadrant with some mild nausea. There are  no real alleviator or precipitating elements to his pain.  He has been seen  in [place] by his primary care physicians who did not feel this was heart  related.  A chest x-ray performed on April 04, 2006 which is entirely normal  as was CBC and metabolic profile including liver function tests.  I cannot  see where an amylase or lipase was obtained.   HISTORY:  He really denies true reflux symptoms or dysphagia or any other  specific complaints except for this spasmodic sharp pain which occurs  sporadically.  It is not associated with change in bowel habits, melena or  hematochezia.  He denies the use of alcohol or NSAIDs.  He had no fever,  chills, clay-colored stools, dark urine, icterus.  As per above he does not  feel like this is heart related.  On review of his chart I cannot see where  he has had an ultrasound exam in the last ten years.   He has had multiple cardiac problems atherogenesis under the care of Dr.  [name] .  He has had extensive bypass grafting and was previously on  aspirin and Plavix but has just been discontinued within the last year.  He  also has a history of hypertension, hyperlipidemia, and gouty arthritis  along with BPH and asymptomatic hematuria.   EXAMINATION:  GENERAL:  He is awake and alert and in no acute distress.  VITAL  SIGNS:  He weighs 198 pounds.  Blood pressure is 110/82.  Pulse is 72  and regular.  Could not appreciate stigmata of chronic liver disease.  CHEST:  Generally clear.  HEART:  He appears to be in a regular rhythm without significant murmurs,  gallops or rubs.  ABDOMEN:  His abdomen shows no hepatosplenometry, masses, or tenderness.  Bowel sounds were normal.  RECTAL:  Deferred.   ASSESSMENT:  1. This is the most unusual type of pain in the left upper quadrant.  It      really seems to be located over his hiatal hernia area.  Certainly does      not sound like he has gastric torsion.  I think we need to do spleen,      pancreatitis or cholelithiasis.   RECOMMENDATIONS:  1. Check complete labs including amylase and lipase.  2. Upper abdominal ultrasound exam.  3. Increase Protonix to 40 mg twice a day with Carafate  1 gram after meals      and at bedtime, p.r.n. sublingual Levsin  use.  4. Other medications as per Dr. Christell Constant.  5. May need to repeat endoscopic exam.  6. Consider repeat cardiac evaluation.                                   Vania Rea. Jarold Motto, MD, [place], [place]   DRP/MedQ  DD:  04/06/2006  DT:  04/06/2006  Job #:  [id number]

## 2011-01-13 NOTE — Assessment & Plan Note (Signed)
[facility]                           GASTROENTEROLOGY OFFICE NOTE   NAME:Luberto, MICKEAL DAWS                         MRN:          [id number]  DATE:06/14/2006                            DOB:          September 28, 1925    Basheer has obvious musculoskeletal pain which now is on his right subcostal  area, not his left.  It has definite positional changes.   PHYSICAL EXAMINATION:  Exam of his chest and abdomen shows floating-ribs  bilaterally with localized tenderness.   He underwent endoscopy on May 02, 2006, which showed some chronic  gastritis and a prominent hiatal hernia.  Examination for H. pylori  infection was negative.   ASSESSMENT:  1. Musculoskeletal bilateral upper quadrant pain.  2. Chronic acid reflux disease, on daily proton pump inhibitor and      Carafate therapy.  3. Multiple medical and cardiovascular issues.   RECOMMENDATIONS:  1. Continue reflux regimen and medications.  2. Trial of Celebrex 200 mg once a day, and twice a day if needed.  3. Consider orthopedic referral, depending on clinical response.       Vania Rea. Jarold Motto, MD, [place], [place]      DRP/MedQ  DD:  06/14/2006  DT:  06/15/2006  Job #:  [id number]   cc:   Ernestina Penna, M.D.

## 2011-01-13 NOTE — H&P (Signed)
NAME:  [name] [name], [name] [name]                  ACCOUNT NO.:  [id number]   MEDICAL RECORD NO.:  [id number]          PATIENT TYPE:  INP   LOCATION:  1823                         FACILITY:  [facility]   PHYSICIAN:  Ulyses Amor, MD DATE OF BIRTH:  07-04-1926   DATE OF ADMISSION:  06/05/2005  DATE OF DISCHARGE:                                HISTORY & PHYSICAL   [name] [name] is a 77 year old white man who is admitted to [facility]  for further evaluation of chest pain.   The patient has a history of coronary artery disease which dates back to  66. At that time, he underwent multivessel coronary artery bypass  grafting. His last cardiac catheterization was performed in June of 2004 and  indicated no need for intervention.   The patient presented to the emergency department after experiencing a brief  episode of chest pain at home. This occurred at rest. The chest pain was  described as a focal, midsubsternal burning. It was relieved in five minutes  with antacids. He has experienced no further chest pain. There was no  associated dyspnea, diaphoresis, or nausea. There were no exacerbating or  ameliorating factors. It appeared not to be related to position, activity,  meals, or respirations. He also noted this evening that his blood pressure  was running higher than usual. He feels completely well at this time and he  has had no further chest pain.   In addition to the coronary disease noted above, the patient has a history  of paroxysm atrial fibrillation, hypertension, and dyslipidemia. He is  status post esophageal dilatation.   MEDICATIONS:  Norvasc, aspirin, Lisinopril, Hytrin, and Betapace.   ALLERGIES:  TAGAMET and ZANTAC.   OPERATIONS:  Coronary artery bypass surgery as described above.   SOCIAL HISTORY:  The patient lives with his wife. He is retired. He drinks  no alcohol and he smokes no cigarettes.   FAMILY HISTORY:  Noncontributory.   REVIEW OF SYSTEMS:  Revealed no  new problems related to his head, eyes,  ears, nose, mouth, throat, lungs, gastrointestinal system, genitourinary  system, or extremities. There was no history of neurologic or psychiatric  disorder. There was no history of fever, chills, or weight loss.   PHYSICAL EXAMINATION:  Blood pressure 173/76, pulse 65 and regular,  respirations 20. The patient was an elderly white man in no discomfort. He  was alert, oriented, appropriate, and responsive. Head, eyes, nose, and  mouth were normal. The neck was without thyromegaly or adenopathy. Carotid  pulses were palpable bilaterally without bruits. Cardiac examination  revealed a normal S1 and S2. There was no S3, S4, murmur, rub, or click.  Cardiac rhythm was regular. No chest wall tenderness was noted. The lungs  were clear. The abdomen was soft and nontender. There was no mass,  hepatosplenomegaly, bruit, distention, rebound, guarding, or rigidity. Bowel  sounds were normal. Rectal and genital examinations were not performed as  they were not pertinent to the reason for acute care hospitalization. The  extremities were without edema, deviation, or deformity. Radial and dorsalis  pedis pulses  were palpable bilaterally. Brief screening neurologic survey  was unremarkable.   The electrocardiogram revealed normal sinus rhythm with a first degree AV  block. There was mild ST segment depression as well as T-wave inversion in  the lateral leads. The chest radiograph, according to the radiologist,  demonstrated cardiomegaly and emphysema. A known ascending aortic aneurysm  was seen. A known left basilar pulmonary nodule was also noted. Initial set  of cardiac markers revealed a myoglobin of 61.4, CK-MB less than 1.0, and  troponin less than 0.05. The potassium was 3.7, BUN 18, creatinine 1.4.  White count was 7.2 with a hemoglobin of 14.5 and a hematocrit of 42.1. The  remaining studies were pending at the time of this dictation.   IMPRESSION:   1.  Chest pain, rule out myocardial infarction: The patient experienced a      focal substernal burning or indigestion that lasted five minutes and was      relieved with an antacid. It probably represents dyspepsia.  2.  Coronary artery disease, status post coronary artery bypass surgery in      1996:  Last cardiac catheterization in 2004 demonstrated no need for      intervention.  3.  Hypertension.  4.  Dyslipidemia.  5.  History of paroxysm atrial fibrillation.  6.  Status post esophageal dilatation.   PLAN:  1.  Telemetry.  2.  Serial cardiac enzymes.  3.  Aspirin.  4.  Lovenox.  5.  Nitroglycerin paste.  6.  Further measures per Dr. [name].      Ulyses Amor, MD  Electronically Signed     MSC/MEDQ  D:  06/06/2005  T:  06/06/2005  Job:  [id number]   cc:   Pearletha Furl. [name], M.D.  Fax: [phone]

## 2011-01-13 NOTE — Consult Note (Signed)
NAME:  [name] [name]                  ACCOUNT NO.:  [id number]   MEDICAL RECORD NO.:  [id number]          PATIENT TYPE:  EMS   LOCATION:  MAJO                         FACILITY:  [facility]   PHYSICIAN:  [name] Hawks. Elnoria Howard, MD    DATE OF BIRTH:  February 18, 1926   DATE OF CONSULTATION:  04/07/2006  DATE OF DISCHARGE:  04/07/2006                                   CONSULTATION   This is a GI consultation performed in the ER, performed on 04/07/2006.   REASON FOR CONSULTATION:  Nausea, vomiting, abdominal pain, and questionable  hematemesis.   HISTORY OF PRESENT ILLNESS:  This is a 77 year old gentleman with a past  medical history of gastroesophageal reflux disease, coronary artery disease,  status post bypass graft in 1996, status post vagotomy in 1973 secondary to  gastric ulcer, hyperlipidemia, hypertension who presents to the emergency  room with complaints of left-sided abdominal pain, nausea, vomiting and  possible hematemesis.  The patient states that he was evaluated by Dr.  Eloise [name] last week for his complaints of abdominal pain that have been  ongoing for approximately 1 week.  It was acute in onset.  He denies any  prior history of this type of abdominal pain.  There is no association with  diarrhea or constipation.  No reports of any melena or hematochezia.  The  patient was undergoing further workup however, last evening he had a very  difficult time with the abdominal pain, was not able to see and subsequently  requested further evaluation today.  He presented to the emergency room at  my request and examination was performed at this time.  The patient also  denies taking any new medications and does not take any NSAIDs.   PAST MEDICAL HISTORY:  As stated above.   PAST SURGICAL HISTORY:  As stated above.   FAMILY HISTORY:  Noncontributory.   SOCIAL HISTORY:  Negative for alcohol, tobacco or illicit drug use.   ALLERGIES:  NO KNOWN DRUG ALLERGIES.   MEDICATIONS:  1. Norvasc 10 mg  one p.o. q.h.s.  2. Lisinopril 40 mg one p.o. q. day.  3. Hytrin 5 mg one p.o. q. day.  4. Protonix 40 mg one p.o. q. Day.  5. Zocor 80 mg one p.o. q. Day.   REVIEW OF SYSTEMS:  Negative for headache, blurriness, dysarthria, dysuria,  chest pain, shortness of breath, diarrhea.  Positive for nausea, vomiting  and abdominal pain.  No hematochezia or melena.  No arthritis.   PHYSICAL EXAMINATION:  VITAL SIGNS:  Stable.  GENERAL:  The patient is in no acute distress, alert and oriented.  HEENT:  Normocephalic, atraumatic.  Extraocular muscles intact.  Pupils  equal, round and reactive to light.  NECK:  Supple.  No lymphadenopathy.  LUNGS:  Clear to auscultation bilaterally.  CARDIOVASCULAR:  Regular rate and rhythm.  ABDOMEN:  Flat, soft, nontender, nondistended.  No hepatosplenomegaly.  RECTAL:  Negative for any palpable masses and is heme negative.  EXTREMITIES:  No clubbing, cyanosis or edema.   LABORATORY STUDIES:  Pending at this time.   IMPRESSION:  Abdominal  pain of questionable source.   I am unable to define the source of the abdominal pain at this time.  He is  asymptomatic and is able to ambulate without any difficulty and without any  exacerbation of his abdominal pain.  The patient certainly does not have an  acute abdomen.  A CT scan of the abdomen and pelvis have been ordered by the  ER physicians which I am in agreance with in order to evaluate any obvious  etiology.  There is no evidence of any hematemesis at this time.  The  patient did bring a sample of his vomited material and it is negative for  blood.  It appears just to be some old food mixed with bile.   PLAN:  1. Await the CT scan of the abdomen and pelvis.  2. A trial of Ultram 25 mg one to two tablets p.o. q. 4 to 6 hours p.r.n.      pain.  3. The patient will follow-up with Dr. Eloise [name] in the upcoming week.      [name] Hawks Elnoria Howard, MD  Electronically Signed     PDH/MEDQ  D:  04/07/2006  T:   04/08/2006  Job:  [id number]   cc:   [name] [name], M.D.

## 2011-01-13 NOTE — Assessment & Plan Note (Signed)
[facility]                           GASTROENTEROLOGY OFFICE NOTE   NAME:[name] [name], [name] [name]                         MRN:          [id number]  DATE:04/20/2006                            DOB:          [birth year]    [name] [name] is doing much better with his left upper quadrant pain on his current  regimen of Carafate four times a day in addition to his Protonix 40 mg twice  a day.  His upper abdominal ultrasound exam was unremarkable as was  laboratory data.   He continues to complain of some vague discomfort in his left upper  quadrant, and apparently has been in the emergency room since I last saw  him.  Ultrasound exam on April 11, 2006, was unremarkable.   Vital signs were all stable, his weight is down 6-7 pounds from a  year ago.  Is down 2 pounds from 10 days ago.  Cannot appreciate any  hepatosplenomegaly, abdominal masses or significant tenderness although he  is sore in his left upper quadrant area.  Abdominal bowel sounds were normal  and I could not appreciate a bruit.   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  I have gone ahead  and scheduled that for him at his convenience.  Will continue his current  regimen in the interim, especially in view of the fact that he seems to be  improving steadily.  His last colonoscopy exam also was in December of 2006  and was unremarkable.                                   Vania Rea. Jarold Motto, MD, [place], [place]   DRP/MedQ  DD:  04/20/2006  DT:  04/20/2006  Job #:  [id number]   cc:   Ernestina Penna, MD

## 2011-01-13 NOTE — Discharge Summary (Signed)
NAME:  [name] [name], [name] [name]                  ACCOUNT NO.:  [id number]   MEDICAL RECORD NO.:  [id number]          PATIENT TYPE:  INP   LOCATION:  3707                         FACILITY:  [facility]   PHYSICIAN:  Richard A. [name], M.D.DATE OF BIRTH:  July 24, 1926   DATE OF ADMISSION:  06/05/2005  DATE OF DISCHARGE:  06/06/2005                                 DISCHARGE SUMMARY   Mr. [name] [name] is a 77 year old male patient of Dr. Susa Griffins who  came into the hospital through the emergency room, admission by Dr. Waldon Reining  who is on call for our service, with complaints of chest pain, high blood  pressure, and feeling like his heart was going fast. They apparently called  EMS at home and he states his blood pressure was elevated at 200/110 and his  heart rate was near 90.  When he came to the emergency room his first blood  pressure recorded there was 194/84, pulse 81, respirations 20. He was  admitted and his CK-MB and troponins were negative. In the emergency room  his second CK-MB was negative. His troponin was 0.06.  His third CK-MB was  negative and the third troponin was 0.06. His EKG showed some T-wave  inversions anteriorly and laterally. However, he had had these EKG changes  previously. His most recent Cardiolite test has been as an outpatient, February 17, 2005, and it did not show any ischemia. He did have extensive fixed  anterior septal defect with a fixed inferior wall defect. Last cath was in  2004 at which time his LIMA to his LAD was patent. He has had a history of  MI in the past. He walked in the halls and it was felt by Dr. Jacinto Halim on  June 06, 2005, that he could be discharged home. We may want to do an  outpatient event monitor if he has any further symptoms.   Other labs include urine negative. Hemoglobin 14.5, hematocrit 42.1, WBC  7.2, platelet count 60,000. INR was 1.0. Magnesium was 2.1. Sodium 138,  potassium 3.7, BUN 18, creatinine 1.4, glucose 169. Albumin was 3.7.  Calcium was 9.0.   Chest x-ray was not obtainable at the time of discharge.   DISCHARGE MEDICATIONS:  1.  Terazosin 10 mg at bedtime.  2.  Amlodipine 10  mg daily.  3.  Lisinopril 40 mg two times per day.  4.  Sotalol 80 mg one in the a.m. and half in the p.m.  5.  Simvastatin 80 mg at bedtime.  6.  Proscar 5 mg one time per day.  7.  Aspirin 81 mg two times two a day.  8.  Protonix 40 mg one time per day.  9.  Plavix 75 mg one time per day. Will discuss questionable restart with      Dr. Jacinto Halim prior to discharge. It was stopped as an outpatient secondary      to epistaxis. His nasal passages have been cauterized. Told by the ENT      that he can restart.   He will follow up with Dr. [name] in November  and he will return to see  Dr. Jarold Motto or his regular MD which is Dr. [name].   ASSESSMENT:  1.  Chest discomfort with some feeling of heart racing and elevated blood      pressure. CK-MB is negative. Troponin only 0.06.  2.  History of coronary artery disease, last cath in 2004 and last      Cardiolite June 2006 with no ischemia. Positive anterior scar with prior      medical history of myocardial infarction. Last cath showed patent      grafts, 50% beyond his left anterior descending graft.  3.  Ischemic cardiomyopathy. Ejection fraction on Cardiolite was 40%.  4.  Hypertension. Elevated apparently when he had his episode, but here in      the hospital on his usual medications has been controlled anywhere from      123 to 135 systolic. Diastolic has not been elevated.  5.  History of paroxysmal atrial fibrillation. No evidence of atrial      fibrillation. Has not been on Coumadin.  6.  Hyperlipidemia.  7.  Thrombocytopenia, questionable.  Patient denies any prior history of low      platelets. Will discuss with Dr. Jacinto Halim.  8.  History of esophageal stricture with prior dilatation.      Lezlie Octave, N.P.      Richard A. [name], M.D.  Electronically  Signed    BB/MEDQ  D:  06/06/2005  T:  06/06/2005  Job:  [id number]   cc:   Gerlene Burdock A. [name], M.D.  Fax: [phone]   Ernestina Penna, M.D.  Fax: [phone]   Vania Rea. Jarold Motto, M.D. [facility]  [address]  [place]  [place] [address]

## 2011-01-13 NOTE — Discharge Summary (Signed)
NAME:  [name] [name], [name] [name]                            ACCOUNT NO.:  [id number]   MEDICAL RECORD NO.:  [id number]                   PATIENT TYPE:  INP   LOCATION:  3728                                 FACILITY:  [facility]   PHYSICIAN:  Cristy Hilts. Jacinto Halim, M.D.                  DATE OF BIRTH:  1926/07/07   DATE OF ADMISSION:  02/21/2003  DATE OF DISCHARGE:  02/24/2003                                 DISCHARGE SUMMARY   DISCHARGE DIAGNOSES:  1. Palpitations, resolved on admission.  2. Paroxysmal atrial fibrillation on admission with rapid ventricular     response, discharged in sinus rhythm.  3. Coronary artery disease, status post myocardial infarction remotely,     status post coronary artery bypass grafting in 1996, status post     catheterization this admission, no interventions, medical therapy,     recommended intensifying medical therapy.  4. Hypertension, accelerated on admission, well controlled at the time of     discharge.  5. Hyperlipidemia.  6. Status post esophageal dilatation recently, treated three weeks ago.   HISTORY OF PRESENT ILLNESS:  A 77 year old Caucasian male patient of Dr.  Kandis Cocking with a prior history of hypertension, hyperlipidemia, remote  tobacco use, status post CABG, and history of PAF who presented to the  emergency room in [facility] with complaints of funny sensation in  his chest, fullness, a rapid heart rate, and a sensation like his heart was  flying.  Blood pressure at the presentation initially was 240/110, heart  rate rapid at 115 to 120, but his EKG later showed sinus brady, his heart  rate in the 50s, and changes on EKG suggestive of ischemia, but there was no  prior EKG for comparison at that time.  Patient was assessed by Dr. Domingo Sep  and scheduled for a cardiac cath the next morning, the morning of February 23, 2003.   HOSPITAL PROCEDURES:  Cardiac cath, left heart cath, performed by Dr. Jacinto Halim  on February 23, 2003, which showed patent SVG to  RCA, patent SVG to first obtuse  marginal and second obtuse marginal, patent SVG to ramus intermedius, and  patent LIMA to LAD, patent SVG to first diagonal.  No changes in the left  ventricular ejection fraction from cath in May of '96.  Recommendations:  Continue with medical therapy.  Also, post cath, it was recommended that the  patient have renal artery ultrasound to rule out renal artery stenosis.  Ejection fraction was calculated at 40 to 45%.   HOSPITAL COMPLICATIONS:  None.   HOSPITAL LABORATORIES AND PERTINENT STUDIES:  His complete blood count  revealed white blood cells of 7.9, hemoglobin 13.4, hematocrit 38.9,  platelets 170.  BMP showed 140 sodium, 3.9 potassium, 106 chloride, 28  carbon dioxide, BUN 16, creatinine 1.3, glucose 106.  On the morning of  discharge, his EKG was in sinus rhythm; no interval  change was seen.   Chest x-ray showed no acute abnormality.  Cardiac enzymes were negative  times three.  Lipid profile showed total cholesterol 158, triglycerides 80,  HDL 41, LDL 101.   DISPOSITION:  The patient was considered in stable condition from a  cardiovascular standpoint and ready for discharge home.   DISCHARGE MEDICATIONS:  1. Terazosin 10 mg one p.o. at bedtime.  2. Zocor 80 mg daily.  3. Aspirin 81 mg daily.  4. Norvasc 5 mg daily.  5. Metoprolol 25 mg b.i.d.  6. Altace 5 mg daily.   DISCHARGE INSTRUCTIONS:  1. Patient was instructed to start a new medicine, Altace, decrease     metoprolol and Norvasc, and increase Zocor dose.  2. Also, he was instructed not to drive, not to engage in any strenuous     activity, and avoid lifting greater than 5 pounds for three days post     cath.  3. Continue with a low-fat, low-cholesterol diet.  4. He is allowed to shower.  5. Instructed to report any problems with current puncture site to our     office.  6. He is to see Dr. [name] on March 27, 2003, at 3:45 p.m., and prior to     that appointment, on March 04, 2003, he will have a renal artery duplex     ultrasound at 7:30 a.m.     Raymon Mutton, P.A.                    Cristy Hilts. Jacinto Halim, M.D.    MK/MEDQ  D:  02/24/2003  T:  02/24/2003  Job:  [id number]   cc:   Gerlene Burdock A. [name], M.D.  [phone] [address], [address]  [place]  [place] [address]  Fax: [phone]   Alda Lea  [address]  [place]  [place] [address]  Fax: [phone]    cc:   Richard A. [name], M.D.  [phone] [address], [address]  [place]  [place] [address]  Fax: [phone]   Alda Lea  [address]  [place]  [place] [address]  Fax: [phone]

## 2011-01-13 NOTE — Cardiovascular Report (Signed)
NAME:  [name] [name], [name] [name]                            ACCOUNT NO.:  [id number]   MEDICAL RECORD NO.:  [id number]                   PATIENT TYPE:  INP   LOCATION:  3728                                 FACILITY:  [facility]   PHYSICIAN:  Cristy Hilts. Jacinto Halim, M.D.                  DATE OF BIRTH:  1926-01-29   DATE OF PROCEDURE:  02/23/2003  DATE OF DISCHARGE:                              CARDIAC CATHETERIZATION   REFERRING. PHYSICIAN:  Richard A. [name], M.D.   PROCEDURES PERFORMED:  1. Left ventriculography.  2. Selective right and left coronary arteriography.  3. Left internal mammary artery and saphenous venous bypass graft.   INDICATIONS:  Mr. [name] [name] is a 77 year old gentleman with a history of  coronary disease, status post coronary artery bypass grafting on Jan 02, 1995, with known LV systolic dysfunction with an EF of 40-45%, was  readmitted to the hospital with chest discomfort and also he had abnormal  EKG in the form of new T-wave inversion in the lateral leads.  Given his  previous cardiac history and also new EKG changes, he was brought to the  cardiac catheterization to evaluate his coronary anatomy.   HEMODYNAMIC DATA:  1. The left ventricular pressure was 190/11 with an end-diastolic pressure     of 20 mmHg.  2. The aortic pressure was 185/82 with a mean of 126 mmHg.  3. There was no pressure gradient across the aortic valve.   ANGIOGRAPHIC DATA:  1. Left ventricle:  The left ventricular systolic function was mildly     depressed, and the ejection fraction was estimated at 40-45% with     anteroseptal, distal, anterolateral, anteroapical, and inferoapical     hypokinesis.  This was not significantly changed compared to Jan 02, 1995.  2. Right coronary artery:  The right coronary artery is occluded in its     proximal segment.  Distally it is supplied by SVG graft.  3. Left main coronary artery:  The left main coronary artery has ostial     calcific 70% stenosis.  4.  Circumflex coronary artery:  The circumflex coronary artery is a moderate-     caliber vessel and gives origin to moderate-sized obtuse marginal 1.     Both the circumflex and the obtuse marginal are occluded in their     proximal segment and have bridging collaterals.  The distal vessel is     also supplied by the saphenous venous graft.  5. Ramus intermediate:  The ramus intermediate is a large-caliber vessel     (optional diagonal).  The ramus intermediate is a large-caliber vessel.     There is complicated filling noted in the distal ramus intermediate.  6. Left anterior descending artery:  The left anterior descending artery is     a large-caliber vessel in its proximal segment, gives origin to a small     to  moderate-sized diagonal 1.  The LAD is occluded in its mid segment.     The distal LAD is supplied by the LIMA.  The LAD after the graft has a 40-     50% stenosis.  7. SVG to PDA/PL1 and PL2:  The SVG to RCA is widely patent.  The ostium of     this SVG graft appeared to have tight stenosis, but there was no damping     and the ostial size appeared to be the same size as the SVG graft.  8. SVG to OM-1 and circumflex:  SVG to OM-1 and circumflex is widely patent.  9. SVG to ramus intermediate and diagonal 1:  The SVG to ramus RI and D-1 is     widely patent.  10.      LIMA to LAD:  The LIMA to LAD is widely patent.    IMPRESSION:  1. Mildly depressed left ventricular ejection fraction of 40-45% with     anteroseptal, distal, anterolateral, anteroapical, and inferoapical     hypokinesis.  Unchanged from Jan 02, 1995.  2. Occluded right with patent saphenous vein graft to right coronary artery,     occluded circumflex, which had bridging collaterals and also has patent     saphenous vein graft to obtuse marginal 1 and circumflex.  3. Occluded left anterior descending coronary artery, which is supplied by     left internal mammary artery to left anterior descending, which is  widely     patent.  4. Widely patent saphenous vein graft to ramus and diagonal 1.  5. Ostial 70% left main disease.   RECOMMENDATIONS:  Given the present anatomy, continued medical therapy is  advised.   TECHNIQUE OF THE PROCEDURE:  Under the usual sterile precautions using 6  French right femoral artery access, a 6 [place] multipurpose pigtail was  advanced to the ascending aorta over a 0.038 inch J-wire.  The catheter was  then advanced into the left ventricle.  Left ventricular pressures were  monitored.  Hand contrast injection of the left ventricle was performed both  in the LAO and RAO projection.  The catheter was flushed, was pulled back  into the ascending aorta, and __________ was monitored.  The same catheter  was utilized to engage the SVG to RCA.  Then the catheter was pulled out of  the body in the usual fashion and a 6 [place] FL 5 diagnostic catheter was  advanced into the ascending aorta over a 0.038 inch J-wire.  The left  coronary artery was selectively engaged and angiography was performed.  Then  the catheter was pulled out of the body in the usual fashion.  Then a 6  [place] Noto catheter was advanced into the ascending aorta over the J-wire  and the right coronary artery was selectively engaged and angiography was  performed.  Then the catheter was pulled out of the body in the usual  fashion and a 5 [place] LIMA catheter was advanced over the J-wire and left  subclavian artery was selectively cannulated and angiography was completed.  The LIMA was not selectively cannulated.  The LIMA was well-visualized.  Then the catheter was pulled out of the body in the usual fashion.  The  patient tolerated the procedure well.  Cristy Hilts. Jacinto Halim, M.D.    Pilar Plate  D:  02/23/2003  T:  02/24/2003  Job:  [id number]  Richard A. [name], M.D.  [phone] [address]  [place]  [place] [address]  Fax: [phone]  cc:   Pearletha Furl.  [name], M.D.  [phone] [address]  [place]  [place] [address]  Fax: [phone]

## 2011-01-13 NOTE — Procedures (Signed)
[facility]                            ABDOMINAL ULTRASOUND REPORT   NAME:[name] [name]                         MRN:          [id number]  DATE:04/11/2006                            DOB:          1926-01-27    ACCESSION NUMBER:  [id number]   READING PHYSICIAN:  [name] Rea. Jarold Motto, MD, [name] [name], FACP   PROCEDURE:  Multiplanar abdominal ultrasound imaging was performed in the  upright, supine, right and left lateral decubitus positions.   RESULTS:  Abdominal aorta 2.4 cm.  The IVC is patent.   The pancreas is poorly visualized because of overlying bowel gas.   Gallbladder is well distended, thin walled, with no pericholecystic fluid or  intraluminal echogenic foci to suggest gallstone disease.  Wall thickness  slightly thickened at 3.7 mm.   The common bile duct measures 3.6 mm in maximal diameter without evidence of  intraluminal foci.   The liver appears normal without evidence of parenchymal lesion, ductal  dilatation or vascular abnormality.   Kidneys are normal in appearance.  Right 10.1, left kidney appears normal,  the diameter not given.   Spleen is slightly enlarged measuring 13.3 cm.   ASSESSMENT:  This ultrasound exam shows no evidence of cholelithiasis or  biliary ductal dilatation.  Pancreas is not well visualized.  There appears  to be slight splenomegaly noted.                                   [name] Rea. Jarold Motto, MD, [name] [name], [place]   DRP/MedQ  DD:  04/12/2006  DT:  04/12/2006  Job #:  [id number]

## 2011-02-15 ENCOUNTER — Ambulatory Visit: Payer: Medicare Other | Admitting: Critical Care Medicine

## 2011-03-03 ENCOUNTER — Encounter: Payer: Self-pay | Admitting: Internal Medicine

## 2011-03-03 ENCOUNTER — Ambulatory Visit (INDEPENDENT_AMBULATORY_CARE_PROVIDER_SITE_OTHER): Payer: Medicare Other | Admitting: Internal Medicine

## 2011-03-03 ENCOUNTER — Telehealth: Payer: Self-pay | Admitting: Critical Care Medicine

## 2011-03-03 VITALS — BP 136/70 | HR 67 | Temp 98.3°F | Ht 71.0 in | Wt 195.0 lb

## 2011-03-03 DIAGNOSIS — R05 Cough: Secondary | ICD-10-CM

## 2011-03-03 MED ORDER — OMEPRAZOLE 40 MG PO CPDR: 40.0000 mg | DELAYED_RELEASE_CAPSULE | Freq: Every day | ORAL | Status: DC

## 2011-03-03 MED ORDER — FAMOTIDINE 20 MG PO TABS: ORAL_TABLET | ORAL | Status: DC

## 2011-03-03 NOTE — Assessment & Plan Note (Signed)
Classic Upper airway cough syndrome, so named because it's frequently impossible to sort out how much is  CR/sinusitis with freq throat clearing (which can be related to primary GERD)   vs  causing  secondary (" extra esophageal")  GERD from wide swings in gastric pressure that occur with throat clearing, often  promoting self use of mint and menthol lozenges that reduce the lower esophageal sphincter tone and exacerbate the problem further in a cyclical fashion.   These are the same pts who not infrequently have failed to tolerate ace inhibitors,  dry powder inhalers or biphosphonates or report having reflux symptoms that don't respond to standard doses of PPI , and are easily confused as having aecopd or asthma flares,   [name] [name] is now off ace and better x while sleeping and admits not compliant with rec to maintain on ppi.  See instructions for specific recommendations which were reviewed directly with the patient who was given a copy with highlighter outlining the key components.

## 2011-03-03 NOTE — Telephone Encounter (Signed)
[id number]

## 2011-03-03 NOTE — Progress Notes (Signed)
  Subjective:    Patient ID: [name] [name], male    DOB: 02/15/1926, 77 y.o.   MRN: [id number]  HPI  77 years old male with upper airway instability syndrome. Pt intermittenlty on  ACE inhibitor despite our rec to avoid it. Past history is also pertinent in that patient was on mechanical ventilation for prolonged period time with associated tracheostomy placement with the patient's 1996 bypass surgery.    Stopped ACE  01/04/11   01/04/11 Acute OV  Complains of c/o SOB worse this am - Mostly with sitting still not much with exertion - Wheezing - Chest tightness. Over last week, has had dry cough with wheezing. Worse today. He is now off ACE inhibitor back on Benicar.  NO otc used. Has been using his rescue inhaler last 2 days.   03/03/2011 ov/ Joey Hudock cc continued episodes of noct wheeze, intermittent, better p uses b2 , non compliant with gerd rx.  No color to sputum. No daytime symptoms  Pt denies any significant sore throat, dysphagia, itching, sneezing,  nasal congestion or excess/ purulent secretions,  fever, chills, sweats, unintended wt loss, pleuritic or exertional cp, hempoptysis, leg swelling.    Also denies any obvious fluctuation of symptoms with weather or environmental changes or other aggravating or alleviating factors.               Objective:   Physical Exam GEN: A/Ox3; pleasant , NAD, elderly  HEENT:  [place]/AT,  EACs-clear, TMs-wnl, NOSE-clear, THROAT-clear, no lesions, no postnasal drip or exudate noted.   NECK:  Supple w/ fair ROM; no JVD; normal carotid impulses w/o bruits; no thyromegaly or nodules palpated; no lymphadenopathy.  RESP  Minimal pseudowheeze   CARD:  RRR, no m/r/g  , no peripheral edema, pulses intact, no cyanosis or clubbing.  GI:   Soft & nt; nml bowel sounds; no organomegaly or masses detected.  Musco: Warm bil, no deformities or joint swelling noted.   Neuro: alert, no focal deficits noted.    Skin: Warm, no lesions or rashes          Assessment & Plan:

## 2011-03-03 NOTE — Telephone Encounter (Signed)
I reviewed Dr Lynelle Doctor notes - has UA instability. I agree that he needs to be evaluated. I don't feel comfortable starting him on prednisone without evaluation. We should get him seen here asap if he has acute sx, seen by Dr Delford Field if this is subacute. If his breathing changes, if he develops wheezing or any concerning symptoms then he should be seen in ED.

## 2011-03-03 NOTE — Patient Instructions (Signed)
Start taking the omeprazole 40mg  Take 30-60 min before first meal of the day and Pepcid 20 mg one at bedtime for a full month  GERD (REFLUX)  is an extremely common cause of respiratory symptoms, many times with no significant heartburn at all.    It can be treated with medication, but also with lifestyle changes including avoidance of late meals, excessive alcohol, smoking cessation, and avoid fatty foods, chocolate, peppermint, colas, red wine, and acidic juices such as orange juice.  NO MINT OR MENTHOL PRODUCTS SO NO COUGH DROPS  USE SUGARLESS CANDY INSTEAD (jolley ranchers or Stover's)  NO OIL BASED VITAMINS   Use albuterol only as needed but if your problem resolves on acid suppression, it's primarily an acid problem.  Please schedule a follow up office visit in 4 weeks, sooner if needed to see Dr Delford Field

## 2011-03-03 NOTE — Telephone Encounter (Signed)
Spoke with the patient and he states x 1 day he has been having increased SOB and a "gurgling" sound in his chest. He denies cough, wheezing, chest tightness, or fever. Pt last seen 07-2010, so I offered him an appt but pt refused stating he does not feel like he needs to come and request rx for prednisone be called in. Please advise.[name] [name], CMA Allergies  Allergen Reactions  . Cimetidine     REACTION: unknown reaction, pt states per Dr. Jarold Motto  . Ranitidine Hcl     REACTION: unknown reaction, pt states per Dr. Jarold Motto

## 2011-03-03 NOTE — Telephone Encounter (Signed)
Pt advised of recs and appt set to see MW this pm.Jennifer Yancey Flemings, CMA

## 2011-03-13 ENCOUNTER — Telehealth: Payer: Self-pay | Admitting: Gastroenterology

## 2011-03-13 NOTE — Telephone Encounter (Signed)
OK 

## 2011-03-13 NOTE — Telephone Encounter (Signed)
Pt with hx of GERD, Recurrent Peptic Strictures, HH.  EGD on 04/2006 with dilation of stricture, Gastritis, Chronic GERD from questionable NSAID use or alcohol related gastropathy. COLON 07/2005 normal. Pt saw Dr Sherene Sires on 03/03/11 for cough thought to be r/t ACE inhibitor and/or reflux. Pt placed on Omeprazole QAM and Pepcid QHS. A week ago Sunday, pt had an episode of nausea and finally threw up liquids. He did ok all week until this am when he got up in the middle of the night and developed nausea. The first episode of vomiting really burned his throat and he wants to know if he needs an ENDO. DIRECT EGD?

## 2011-03-14 NOTE — Telephone Encounter (Signed)
Dr Jarold Motto, you have 2 openings tomorrow for DIRECT EGD, Propofol day. Can we schedule pt for 1 of those slots- non Propofol? Thanks.

## 2011-03-14 NOTE — Telephone Encounter (Signed)
ok 

## 2011-03-14 NOTE — Telephone Encounter (Signed)
Informed pt Dr Jarold Motto will do an EGD tomorrow at 4pm; he needs to be here by 3pm. Gave pt instructions for NPO after midnight for solid food and milk/milk products. Gave pt clear liquids he could drink until 2pm tomorrow and informed him he must have a driver for the entire time he is here; pt stated understanding.

## 2011-03-15 ENCOUNTER — Encounter: Payer: Self-pay | Admitting: Gastroenterology

## 2011-03-15 ENCOUNTER — Ambulatory Visit (AMBULATORY_SURGERY_CENTER): Payer: Medicare Other | Admitting: Gastroenterology

## 2011-03-15 VITALS — BP 153/82 | HR 87 | Temp 97.4°F | Resp 18 | Ht 71.0 in | Wt 185.0 lb

## 2011-03-15 DIAGNOSIS — K2970 Gastritis, unspecified, without bleeding: Secondary | ICD-10-CM

## 2011-03-15 DIAGNOSIS — K222 Esophageal obstruction: Secondary | ICD-10-CM

## 2011-03-15 DIAGNOSIS — R05 Cough: Secondary | ICD-10-CM

## 2011-03-15 DIAGNOSIS — R1310 Dysphagia, unspecified: Secondary | ICD-10-CM

## 2011-03-15 DIAGNOSIS — K2950 Unspecified chronic gastritis without bleeding: Secondary | ICD-10-CM

## 2011-03-15 DIAGNOSIS — K449 Diaphragmatic hernia without obstruction or gangrene: Secondary | ICD-10-CM

## 2011-03-15 DIAGNOSIS — K219 Gastro-esophageal reflux disease without esophagitis: Secondary | ICD-10-CM

## 2011-03-15 DIAGNOSIS — K2990 Gastroduodenitis, unspecified, without bleeding: Secondary | ICD-10-CM

## 2011-03-15 MED ORDER — SODIUM CHLORIDE 0.9 % IV SOLN: 500.0000 mL | INTRAVENOUS | Status: DC

## 2011-03-15 NOTE — Patient Instructions (Addendum)
Please refer to blue and green discharge instruction sheet. Stop Prilosec.  Start Dexilant-samples given

## 2011-03-15 NOTE — Progress Notes (Signed)
Stop Prilosec.  Start Dexilant.  Give samples.  VO Dr Patterson/ AWillett rn

## 2011-03-16 ENCOUNTER — Other Ambulatory Visit: Payer: Medicare Other

## 2011-03-16 ENCOUNTER — Encounter: Payer: Self-pay | Admitting: Cardiothoracic Surgery

## 2011-03-16 ENCOUNTER — Ambulatory Visit
Admission: RE | Admit: 2011-03-16 | Discharge: 2011-03-16 | Disposition: A | Discharge status: Home / Self Care | Payer: Medicare Other | Source: Ambulatory Visit | Attending: Cardiothoracic Surgery | Admitting: Cardiothoracic Surgery

## 2011-03-16 ENCOUNTER — Telehealth: Payer: Self-pay | Admitting: *Deleted

## 2011-03-16 ENCOUNTER — Encounter: Payer: Medicare Other | Admitting: Cardiothoracic Surgery

## 2011-03-16 ENCOUNTER — Ambulatory Visit (INDEPENDENT_AMBULATORY_CARE_PROVIDER_SITE_OTHER): Payer: Medicare Other | Admitting: Cardiothoracic Surgery

## 2011-03-16 VITALS — BP 138/80 | HR 70 | Resp 16

## 2011-03-16 DIAGNOSIS — I712 Thoracic aortic aneurysm, without rupture: Secondary | ICD-10-CM

## 2011-03-16 DIAGNOSIS — K219 Gastro-esophageal reflux disease without esophagitis: Secondary | ICD-10-CM

## 2011-03-16 DIAGNOSIS — I2510 Atherosclerotic heart disease of native coronary artery without angina pectoris: Secondary | ICD-10-CM

## 2011-03-16 DIAGNOSIS — Z7901 Long term (current) use of anticoagulants: Secondary | ICD-10-CM

## 2011-03-16 DIAGNOSIS — K222 Esophageal obstruction: Secondary | ICD-10-CM

## 2011-03-16 DIAGNOSIS — R911 Solitary pulmonary nodule: Secondary | ICD-10-CM

## 2011-03-16 DIAGNOSIS — K2940 Chronic atrophic gastritis without bleeding: Secondary | ICD-10-CM

## 2011-03-16 LAB — HELICOBACTER PYLORI SCREEN-BIOPSY: UREASE: NEGATIVE

## 2011-03-16 MED ORDER — IOHEXOL 300 MG/ML  SOLN
100.0000 mL | Freq: Once | INTRAMUSCULAR | Status: AC | PRN
  Administered 2011-03-16: 100 mL via INTRAVENOUS

## 2011-03-16 NOTE — Assessment & Plan Note (Signed)
OFFICE VISIT  Weed, Marten T DOB:  10/20/1925                                        March 16, 2011 CHART #:  [id number]  The patient returns office today after he had under gone coronary artery bypass grafting in 1996, x7.  Subsequently developed respiratory failure requiring tracheostomy, and was ventilator dependent for a period of time, over the years he is continued to use, now 77 years old, has had a known dilated ascending aorta which I have followed.  He returns to the office today noting that he recently had been vomiting up dark material, just had an endoscopy yesterday by [facility], but was told there was nothing serious.  On exam today, his blood pressure 138/80, pulse 70, respiratory rate 16, and O2 sats 98%.  His lungs are clear bilaterally.  Sternum is stable and well healed.  He has no calf tenderness or abdominal tenderness.  CT scan is reviewed done today and ascending aorta dilated at 5.4 cm, unchanged from last year.  IMPRESSION:  Dilated ascending aorta.  The patient is now 77 year old, status post coronary artery bypass grafting in 1996.  Again I had discussion with the patient about the risks and options of redo surgery and replacement of his ascending aorta versus a nonoperative approach. At this point, he is doing reasonably well for his age of 25-86 and prefers not to have any major intervention, so I cannot disagree with him on this.  I will plan to see him back again in 1 year.  Sheliah Plane, MD Electronically Signed  EG/MEDQ  D:  03/16/2011  T:  03/16/2011  Job:  [id number]  cc:   Gerlene Burdock A. [name], M.D.

## 2011-03-16 NOTE — Telephone Encounter (Signed)
Follow up Call- Patient questions:  Do you have a fever, pain , or abdominal swelling? no Pain Score  0 *  Have you tolerated food without any problems? no  Pt has a ct this am  Have you been able to return to your normal activities? yes  Do you have any questions about your discharge instructions: Diet   no Medications  no Follow up visit  no  Do you have questions or concerns about your Care? no  Actions: * If pain score is 4 or above: No action needed, pain <4.

## 2011-03-17 ENCOUNTER — Telehealth: Payer: Self-pay | Admitting: Gastroenterology

## 2011-03-17 NOTE — Telephone Encounter (Signed)
Pt reports he had an incident of vomiting early this am with clear, oil-like emesis. He is ok now, just wanted [place] to know. Pt is on Dexilant and i reinforced the fact he should take it 30 minutes before meals 1st thing in the am. Informed him Dr Jarold Motto stated his Gastritis was really bad and it may take the Dexilant a few days to work. Pt instructed to call for further problems. Pt stated understanding.

## 2011-03-20 ENCOUNTER — Encounter: Payer: Self-pay | Admitting: Gastroenterology

## 2011-03-22 NOTE — Progress Notes (Signed)
See dictation

## 2011-05-18 LAB — CBC
HCT: 43.7
Hemoglobin: 15
Platelets: 158
WBC: 7.5

## 2011-05-18 LAB — DIFFERENTIAL
Eosinophils Relative: 4
Lymphocytes Relative: 30
Lymphs Abs: 2.2
Monocytes Absolute: 0.7
Neutro Abs: 4.2

## 2011-05-18 LAB — OCCULT BLOOD X 1 CARD TO LAB, STOOL: Fecal Occult Bld: NEGATIVE

## 2011-05-18 LAB — PROTIME-INR: Prothrombin Time: 21.9 — ABNORMAL HIGH

## 2011-06-05 LAB — PROTIME-INR
INR: 2.1 — ABNORMAL HIGH
Prothrombin Time: 24.6 — ABNORMAL HIGH

## 2011-06-09 ENCOUNTER — Ambulatory Visit (INDEPENDENT_AMBULATORY_CARE_PROVIDER_SITE_OTHER): Payer: Medicare Other | Admitting: Critical Care Medicine

## 2011-06-09 ENCOUNTER — Encounter: Payer: Self-pay | Admitting: Critical Care Medicine

## 2011-06-09 ENCOUNTER — Telehealth: Payer: Self-pay | Admitting: Critical Care Medicine

## 2011-06-09 VITALS — BP 118/72 | HR 73 | Temp 97.6°F | Ht 70.0 in | Wt 195.0 lb

## 2011-06-09 DIAGNOSIS — R05 Cough: Secondary | ICD-10-CM

## 2011-06-09 DIAGNOSIS — J209 Acute bronchitis, unspecified: Secondary | ICD-10-CM

## 2011-06-09 MED ORDER — PREDNISONE 10 MG PO TABS: ORAL_TABLET | ORAL | Status: DC

## 2011-06-09 MED ORDER — BUDESONIDE 180 MCG/ACT IN AEPB: 2.0000 | INHALATION_SPRAY | Freq: Two times a day (BID) | RESPIRATORY_TRACT | Status: DC

## 2011-06-09 NOTE — Telephone Encounter (Signed)
I spoke with pt and he states he is having increase SOB. States he has been having a hard time breathing. Pt is coming in to see PW AT 10:00 this morning

## 2011-06-09 NOTE — Progress Notes (Signed)
Subjective:    Patient ID: [name] [name], male    DOB: 08-28-1926, 77 y.o.   MRN: [id number]  HPI 77 y.o. male with upper airway instability syndrome. Pt taking ACE inhibitor despite our rec to avoid.  Past history is also pertinent in that patient was on mechanical ventilation for prolonged period time with associated tracheostomy placement with the patient's 1996 bypass surgery.   06/09/2011 Last seen 7/12 per MW Dr Sherene Sires Rec at this OV Start taking the omeprazole 40mg  Take 30-60 min before first meal of the day and Pepcid 20 mg one at bedtime for a full month  GERD (REFLUX) is an extremely common cause of respiratory symptoms, many times with no significant heartburn at all.  It can be treated with medication, but also with lifestyle changes including avoidance of late meals, excessive alcohol, smoking cessation, and avoid fatty foods, chocolate, peppermint, colas, red wine, and acidic juices such as orange juice.  NO MINT OR MENTHOL PRODUCTS SO NO COUGH DROPS  USE SUGARLESS CANDY INSTEAD (jolley ranchers or Stover's)  NO OIL BASED VITAMINS  Use albuterol only as needed but if your problem resolves on acid suppression, it's primarily an acid problem.  Please schedule a follow up office visit in 4 weeks, sooner if needed to see Dr Delford Field       No episodes daily, but when has an episode is severe:  Last night, acute dyspnea at 1am.  Had to sit in comforter. Had to sleep sitting up, then at 530am.  No real cough.  Sl rattle.  Notes sl wheeze.  Felt head stopped up ,  No pndrip. No real heartburn. No regurgitation of food.  No dysphagia.  Esophagus stretched 6months ago per Dr Jarold Motto. No f/c/s.     Review of Systems Constitutional:   No  weight loss, night sweats,  Fevers, chills, fatigue, lassitude. HEENT:   No headaches,  Difficulty swallowing,  Tooth/dental problems,  Sore throat,                No sneezing, itching, ear ache, nasal congestion, post nasal drip,   CV:  No chest  pain,  Orthopnea, PND, swelling in lower extremities, anasarca, dizziness, palpitations  GI  No heartburn, indigestion, abdominal pain, nausea, vomiting, diarrhea, change in bowel habits, loss of appetite  Resp: Notes shortness of breath with exertion and  at rest.  No excess mucus, notes  productive cough,  No non-productive cough,  No coughing up of blood.  No change in color of mucus.  Notes wheezing.  No chest wall deformity  Skin: no rash or lesions.  GU: no dysuria, change in color of urine, no urgency or frequency.  No flank pain.  MS:  No joint pain or swelling.  No decreased range of motion.  No back pain.  Psych:  No change in mood or affect. No depression or anxiety.  No memory loss.     Objective:   Physical Exam Filed Vitals:   06/09/11 1014  BP: 118/72  Pulse: 73  Temp: 97.6 F (36.4 C)  TempSrc: Oral  Height: 5\' 10"  (1.778 m)  Weight: 195 lb (88.451 kg)  SpO2: 97%    Gen: Pleasant, well-nourished, in no distress,  normal affect  ENT: No lesions,  mouth clear,  oropharynx clear, no postnasal drip, mild pseudowheeze  Neck: No JVD, no TMG, no carotid bruits  Lungs: No use of accessory muscles, no dullness to percussion, coarse BS  Cardiovascular: RRR, heart sounds normal, no  murmur or gallops, no peripheral edema  Abdomen: soft and NT, no HSM,  BS normal  Musculoskeletal: No deformities, no cyanosis or clubbing  Neuro: alert, non focal  Skin: Warm, no lesions or rashes   PFT Conversion 11/15/2007  FVC 3.56  FVC PREDICT 4.31  FVC  % Predicted 82  FEV1 2.85  FEV1 PREDICT 3.35  FEV % Predicted 84  FEV1/FVC 80.1  FEV1/FVC PRE 77  FeF 25-75 3.21  FeF 25-75 % Predicted 2.7  FEF % EXPEC 84  PFT RSLT normal  Note CXR 10/12:  No active disease     Assessment & Plan:   Cough Cyclical cough on basis of Gerd, upper airway instability,  Pt awakening at night with regurgitation and acute dyspnea likely from micro aspiration. Note recent CXR showed no  active process. Plan HOB elevation Reflux diet Pulse prednisone Start Pulmicort two puff bid      Updated Medication List Outpatient Encounter Prescriptions as of 06/09/2011  Medication Sig Dispense Refill  . albuterol (PROVENTIL HFA;VENTOLIN HFA) 108 (90 BASE) MCG/ACT inhaler Inhale 2 puffs into the lungs every 4 (four) hours as needed.        Marland Kitchen amLODipine (NORVASC) 5 MG tablet Take 5 mg by mouth daily.        . Cholecalciferol (VITAMIN D3) 1000 UNITS CAPS Take 2 capsules by mouth daily.       . famotidine (PEPCID) 20 MG tablet One at bedtime      . furosemide (LASIX) 40 MG tablet Take 40 mg by mouth daily.        Marland Kitchen LORazepam (ATIVAN) 0.5 MG tablet Take 0.5 mg by mouth every 6 (six) hours as needed.        . metoprolol tartrate (LOPRESSOR) 25 MG tablet Take 25 mg by mouth daily.        Marland Kitchen olmesartan (BENICAR) 20 MG tablet Take 20 mg by mouth daily.        Marland Kitchen omeprazole (PRILOSEC) 40 MG capsule Take 1 capsule (40 mg total) by mouth daily.      . simvastatin (ZOCOR) 80 MG tablet Take 80 mg by mouth at bedtime.        Marland Kitchen terazosin (HYTRIN) 5 MG capsule Take 5 mg by mouth at bedtime.        Marland Kitchen warfarin (COUMADIN) 5 MG tablet as directed.        . budesonide (PULMICORT FLEXHALER) 180 MCG/ACT inhaler Inhale 2 puffs into the lungs 2 (two) times daily.  1 each  5  . predniSONE (DELTASONE) 10 MG tablet Take 4 for two days three for two days two for two days one for two days  20 tablet  0  . DISCONTD: ergocalciferol (VITAMIN D2) 50000 UNITS capsule Take 50,000 Units by mouth once a week.        Marland Kitchen DISCONTD: 0.9 %  sodium chloride infusion

## 2011-06-09 NOTE — Patient Instructions (Addendum)
Start prednisone 10mg  Take 4 for two days three for two days two for two days one for two days Start Pulmicort two puff twice daily I will review labs/chest xray from [facility] office Return 3  weeks for recheck

## 2011-06-10 NOTE — Assessment & Plan Note (Addendum)
Cyclical cough on basis of Gerd, upper airway instability,  Pt awakening at night with regurgitation and acute dyspnea likely from micro aspiration. Note recent CXR showed no active process. Plan HOB elevation Reflux diet Pulse prednisone Start Pulmicort two puff bid

## 2011-07-04 ENCOUNTER — Encounter: Payer: Self-pay | Admitting: Critical Care Medicine

## 2011-07-04 ENCOUNTER — Ambulatory Visit (INDEPENDENT_AMBULATORY_CARE_PROVIDER_SITE_OTHER): Payer: Medicare Other | Admitting: Critical Care Medicine

## 2011-07-04 VITALS — BP 142/66 | HR 77 | Temp 97.6°F | Ht 70.0 in | Wt 196.0 lb

## 2011-07-04 DIAGNOSIS — R0600 Dyspnea, unspecified: Secondary | ICD-10-CM

## 2011-07-04 DIAGNOSIS — R0989 Other specified symptoms and signs involving the circulatory and respiratory systems: Secondary | ICD-10-CM

## 2011-07-04 DIAGNOSIS — R05 Cough: Secondary | ICD-10-CM

## 2011-07-04 DIAGNOSIS — R0609 Other forms of dyspnea: Secondary | ICD-10-CM

## 2011-07-04 NOTE — Assessment & Plan Note (Signed)
Reactive airways disease as a cause of cyclical cough exacerbated by microaspiration from chronic high level reflux Plan No improvement in lung function after inhaled steroids initiated Continued inhaled medications Obtain an overnight sleep oximetry

## 2011-07-04 NOTE — Patient Instructions (Signed)
Stay on pulmicort An overnight oxygen test will be obtained Return 3 months

## 2011-07-04 NOTE — Progress Notes (Signed)
Subjective:    Patient ID: [name] [name], male    DOB: April 01, 1926, 77 y.o.   MRN: [id number]  HPI  77 y.o. male with upper airway instability syndrome. Pt taking ACE inhibitor despite our rec to avoid.  Past history is also pertinent in that patient was on mechanical ventilation for prolonged period time with associated tracheostomy placement with the patient's 1996 bypass surgery.   10/12 No episodes daily, but when has an episode is severe:  Last night, acute dyspnea at 1am.  Had to sit in comforter. Had to sleep sitting up, then at 530am.  No real cough.  Sl rattle.  Notes sl wheeze.  Felt head stopped up ,  No pndrip. No real heartburn. No regurgitation of food.  No dysphagia.  Esophagus stretched 6months ago per Dr Jarold Motto. No f/c/s.   07/04/2011 At last ov we rec: Cyclical cough on basis of Gerd, upper airway instability,  Pt awakening at night with regurgitation and acute dyspnea likely from micro aspiration. Note recent CXR showed no active process. Plan HOB elevation Reflux diet Pulse prednisone Start Pulmicort two puff bid  Notes still dyspneic but cough is less.  Did awaken from sleep very dyspneic.  Head of bed is now elevated.  Notes sl wheeze  Past Medical History  Diagnosis Date  . HTN (hypertension)   . GERD (gastroesophageal reflux disease)   . Hyperlipidemia   . Hypertensive cardiovascular disease   . CAD (coronary artery disease)   . MI (myocardial infarction) 1989  . Atrial fibrillation 1/09    s/p DCCV to NSR   . Pulmonary granuloma     left     Family History  Problem Relation Age of Onset  . Liver cancer Sister      History   Social History  . Marital Status: Widowed    Spouse Name: N/A    Number of Children: N/A  . Years of Education: N/A   Occupational History  . 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 Smoking status: Former Smoker -- 0.5 packs/day for 45 years    Types: Cigarettes, Cigars    Quit date: 12/26/1987    . Smokeless tobacco: Never Used  . Alcohol Use: Yes     Occas Beer  . Drug Use: No  . Sexually Active: Not on file   Other Topics Concern  . Not on file   Social History Narrative  . No narrative on file     Allergies  Allergen Reactions  . Cimetidine     REACTION: unknown reaction, pt states per Dr. Jarold Motto  . Ranitidine Hcl     REACTION: unknown reaction, pt states per Dr. Jarold Motto     Outpatient Prescriptions Prior to Visit  Medication Sig Dispense Refill  . albuterol (PROVENTIL HFA;VENTOLIN HFA) 108 (90 BASE) MCG/ACT inhaler Inhale 2 puffs into the lungs every 4 (four) hours as needed.        Marland Kitchen amLODipine (NORVASC) 5 MG tablet Take 5 mg by mouth daily.        . budesonide (PULMICORT FLEXHALER) 180 MCG/ACT inhaler Inhale 2 puffs into the lungs 2 (two) times daily.  1 each  5  . Cholecalciferol (VITAMIN D3) 1000 UNITS CAPS Take 2 capsules by mouth daily.       . famotidine (PEPCID) 20 MG tablet One at bedtime      . furosemide (LASIX) 40 MG tablet Take 40 mg by mouth daily.        Marland Kitchen  LORazepam (ATIVAN) 0.5 MG tablet Take 0.5 mg by mouth every 6 (six) hours as needed.        . metoprolol tartrate (LOPRESSOR) 25 MG tablet Take 25 mg by mouth daily.        Marland Kitchen olmesartan (BENICAR) 20 MG tablet Take 20 mg by mouth daily.        Marland Kitchen omeprazole (PRILOSEC) 40 MG capsule Take 1 capsule (40 mg total) by mouth daily.      . simvastatin (ZOCOR) 80 MG tablet Take 80 mg by mouth at bedtime.        Marland Kitchen terazosin (HYTRIN) 5 MG capsule Take 5 mg by mouth at bedtime.        Marland Kitchen warfarin (COUMADIN) 5 MG tablet as directed.        . predniSONE (DELTASONE) 10 MG tablet Take 4 for two days three for two days two for two days one for two days  20 tablet  0     Review of Systems  Constitutional:   No  weight loss, night sweats,  Fevers, chills, fatigue, lassitude. HEENT:   No headaches,  Difficulty swallowing,  Tooth/dental problems,  Sore throat,                No sneezing, itching, ear ache,  nasal congestion, post nasal drip,   CV:  No chest pain,  Orthopnea, PND, swelling in lower extremities, anasarca, dizziness, palpitations  GI  No heartburn, indigestion, abdominal pain, nausea, vomiting, diarrhea, change in bowel habits, loss of appetite  Resp: Notes shortness of breath with exertion and  at rest.  No excess mucus, notes  productive cough,  No non-productive cough,  No coughing up of blood.  No change in color of mucus.  Notes wheezing.  No chest wall deformity  Skin: no rash or lesions.  GU: no dysuria, change in color of urine, no urgency or frequency.  No flank pain.  MS:  No joint pain or swelling.  No decreased range of motion.  No back pain.  Psych:  No change in mood or affect. No depression or anxiety.  No memory loss.     Objective:   Physical Exam  Filed Vitals:   07/04/11 1113  BP: 142/66  Pulse: 77  Temp: 97.6 F (36.4 C)  TempSrc: Oral  Height: 5\' 10"  (1.778 m)  Weight: 196 lb (88.905 kg)  SpO2: 98%    Gen: Pleasant, well-nourished, in no distress,  normal affect  ENT: No lesions,  mouth clear,  oropharynx clear, no postnasal drip, mild pseudowheeze  Neck: No JVD, no TMG, no carotid bruits  Lungs: No use of accessory muscles, no dullness to percussion, coarse BS  Cardiovascular: RRR, heart sounds normal, no murmur or gallops, no peripheral edema  Abdomen: soft and NT, no HSM,  BS normal  Musculoskeletal: No deformities, no cyanosis or clubbing  Neuro: alert, non focal  Skin: Warm, no lesions or rashes   PFT Conversion 11/15/2007  FVC 3.56  FVC PREDICT 4.31  FVC  % Predicted 82  FEV1 2.85  FEV1 PREDICT 3.35  FEV % Predicted 84  FEV1/FVC 80.1  FEV1/FVC PRE 77  FeF 25-75 3.21  FeF 25-75 % Predicted 2.7  FEF % EXPEC 84  PFT RSLT normal  Note CXR 10/12:  No active disease Note pulmonary functions improved by 10% after inhaled steroid initiated FEV1 going from 56% predicted to 66% predicted    Assessment & Plan:    Cough Reactive airways disease as a  cause of cyclical cough exacerbated by microaspiration from chronic high level reflux Plan No improvement in lung function after inhaled steroids initiated Continued inhaled medications Obtain an overnight sleep oximetry     Updated Medication List Outpatient Encounter Prescriptions as of 07/04/2011  Medication Sig Dispense Refill  . albuterol (PROVENTIL HFA;VENTOLIN HFA) 108 (90 BASE) MCG/ACT inhaler Inhale 2 puffs into the lungs every 4 (four) hours as needed.        Marland Kitchen amLODipine (NORVASC) 5 MG tablet Take 5 mg by mouth daily.        . budesonide (PULMICORT FLEXHALER) 180 MCG/ACT inhaler Inhale 2 puffs into the lungs 2 (two) times daily.  1 each  5  . Cholecalciferol (VITAMIN D3) 1000 UNITS CAPS Take 2 capsules by mouth daily.       . famotidine (PEPCID) 20 MG tablet One at bedtime      . furosemide (LASIX) 40 MG tablet Take 40 mg by mouth daily.        Marland Kitchen LORazepam (ATIVAN) 0.5 MG tablet Take 0.5 mg by mouth every 6 (six) hours as needed.        . metoprolol tartrate (LOPRESSOR) 25 MG tablet Take 25 mg by mouth daily.        Marland Kitchen olmesartan (BENICAR) 20 MG tablet Take 20 mg by mouth daily.        Marland Kitchen omeprazole (PRILOSEC) 40 MG capsule Take 1 capsule (40 mg total) by mouth daily.      . simvastatin (ZOCOR) 80 MG tablet Take 80 mg by mouth at bedtime.        Marland Kitchen terazosin (HYTRIN) 5 MG capsule Take 5 mg by mouth at bedtime.        Marland Kitchen warfarin (COUMADIN) 5 MG tablet as directed.        Marland Kitchen DISCONTD: predniSONE (DELTASONE) 10 MG tablet Take 4 for two days three for two days two for two days one for two days  20 tablet  0

## 2011-07-05 ENCOUNTER — Telehealth: Payer: Self-pay | Admitting: Critical Care Medicine

## 2011-07-05 NOTE — Telephone Encounter (Signed)
Patient says he just spoke with the home health agency but could not give the name of the company. They are on their way to the pt's home now and nothing further is needed from our office at this time.

## 2011-07-11 ENCOUNTER — Telehealth: Payer: Self-pay | Admitting: Critical Care Medicine

## 2011-07-11 DIAGNOSIS — R05 Cough: Secondary | ICD-10-CM

## 2011-07-11 DIAGNOSIS — T17910A Gastric contents in respiratory tract, part unspecified causing asphyxiation, initial encounter: Secondary | ICD-10-CM

## 2011-07-11 NOTE — Telephone Encounter (Signed)
Pt aware of desaturation with ONO 49episodes per hour down to 85%. Will order nocturnal oxygen 2L per [employer]

## 2011-07-11 NOTE — Telephone Encounter (Signed)
Ordered 02 through [employer] [facility]

## 2011-07-13 ENCOUNTER — Telehealth: Payer: Self-pay | Admitting: Critical Care Medicine

## 2011-07-13 NOTE — Telephone Encounter (Signed)
Order faxed to [facility] 07/11/11 and refaxed today 07/13/11

## 2011-07-19 ENCOUNTER — Encounter: Payer: Self-pay | Admitting: Critical Care Medicine

## 2011-09-21 ENCOUNTER — Telehealth: Payer: Self-pay | Admitting: Gastroenterology

## 2011-09-21 NOTE — Telephone Encounter (Signed)
Pt reports his GI problems have gotten worse over the last month. He reports breathing problems, severe "stomach pain" above his waist that goes up into his esophagus that keeps him awake at night. He went to [facility] yesterday and they stopped his Omeprazole and gave him samples of a purple blue like pill instead, but it hasn't helped the pain. Pt with hx with Dr Jarold Motto and had peptic stricture dilated on 03/15/11. Severe gastritis found as well as chronic GERD, large HH. Informed pt Dr Jarold Motto is off until Feb. 4 and I can get him in to see our PA. Pt does want an OV, wants an EGD. Pt stated he can't wait and he may go to the [facility] ER, if not he will call again.

## 2011-09-26 ENCOUNTER — Ambulatory Visit (INDEPENDENT_AMBULATORY_CARE_PROVIDER_SITE_OTHER): Payer: Medicare Other | Admitting: Adult Health

## 2011-09-26 ENCOUNTER — Encounter: Payer: Self-pay | Admitting: Adult Health

## 2011-09-26 VITALS — BP 114/62 | HR 75 | Temp 96.6°F | Ht 71.0 in | Wt 194.6 lb

## 2011-09-26 DIAGNOSIS — R059 Cough, unspecified: Secondary | ICD-10-CM

## 2011-09-26 DIAGNOSIS — R05 Cough: Secondary | ICD-10-CM

## 2011-09-26 NOTE — Progress Notes (Signed)
Subjective:    Patient ID: [name] [name], male    DOB: 1926-01-14, 78 y.o.   MRN: [id number]  HPI  78 y.o. male with upper airway instability syndrome. Pt taking ACE inhibitor despite our rec to avoid.  Past history is also pertinent in that patient was on mechanical ventilation for prolonged period time with associated tracheostomy placement with the patient's 1996 bypass surgery.   10/12 No episodes daily, but when has an episode is severe:  Last night, acute dyspnea at 1am.  Had to sit in comforter. Had to sleep sitting up, then at 530am.  No real cough.  Sl rattle.  Notes sl wheeze.  Felt head stopped up ,  No pndrip. No real heartburn. No regurgitation of food.  No dysphagia.  Esophagus stretched 6months ago per Dr Jarold Motto. No f/c/s.   07/04/11  At last ov we rec: Cyclical cough on basis of Gerd, upper airway instability,  Pt awakening at night with regurgitation and acute dyspnea likely from micro aspiration. Note recent CXR showed no active process. Plan HOB elevation Reflux diet Pulse prednisone Start Pulmicort two puff bid  Notes still dyspneic but cough is less.  Did awaken from sleep very dyspneic.  Head of bed is now elevated.  Notes sl wheeze >>ONO >O2 started for nocturnal desats    09/26/2011 Acute OV  Pt presents for work in OV. Last night woke up in middle of night with shortness of breath episode. O2 tubing was off. He got up placed tubing back on and felt better after few minutes of O2 on.  Fell back asleep and woke up this am okay. No associated chest pain with episode. No dizzines, slurred speech, palpitation or syncope. No cough or wheezing.  Today in office feels breathing Rodena Goldmann is at his baseline. O2 sats 96%.  No discolored mucus or fever.    Past Medical History  Diagnosis Date  . HTN (hypertension)   . GERD (gastroesophageal reflux disease)   . Hyperlipidemia   . Hypertensive cardiovascular disease   . CAD (coronary artery disease)   . MI  (myocardial infarction) 1989  . Atrial fibrillation 1/09    s/p DCCV to NSR   . Pulmonary granuloma     left     Family History  Problem Relation Age of Onset  . Liver cancer Sister      History   Social History  . Marital Status: Widowed    Spouse Name: N/A    Number of Children: N/A  . Years of Education: N/A   Occupational History  . 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 Smoking status: Former Smoker -- 0.5 packs/day for 45 years    Types: Cigarettes, Cigars    Quit date: 12/26/1987  . Smokeless tobacco: Never Used  . Alcohol Use: Yes     Occas Beer  . Drug Use: No  . Sexually Active: Not on file   Other Topics Concern  . Not on file   Social History Narrative  . No narrative on file     Allergies  Allergen Reactions  . Cimetidine     REACTION: unknown reaction, pt states per Dr. Jarold Motto  . Ranitidine Hcl     REACTION: unknown reaction, pt states per Dr. Jarold Motto     Outpatient Prescriptions Prior to Visit  Medication Sig Dispense Refill  . albuterol (PROVENTIL HFA;VENTOLIN HFA) 108 (90 BASE) MCG/ACT inhaler Inhale 2 puffs into the lungs every 4 (four)  hours as needed.        Marland Kitchen amLODipine (NORVASC) 5 MG tablet Take 5 mg by mouth daily.        . budesonide (PULMICORT FLEXHALER) 180 MCG/ACT inhaler Inhale 2 puffs into the lungs 2 (two) times daily.  1 each  5  . Cholecalciferol (VITAMIN D3) 1000 UNITS CAPS Take 2 capsules by mouth daily.       . famotidine (PEPCID) 20 MG tablet One at bedtime      . furosemide (LASIX) 40 MG tablet Take 40 mg by mouth daily.        Marland Kitchen LORazepam (ATIVAN) 0.5 MG tablet Take 0.5 mg by mouth every 6 (six) hours as needed.        . metoprolol tartrate (LOPRESSOR) 25 MG tablet Take 25 mg by mouth daily.        Marland Kitchen olmesartan (BENICAR) 20 MG tablet Take 20 mg by mouth daily.        Marland Kitchen omeprazole (PRILOSEC) 40 MG capsule Take 1 capsule (40 mg total) by mouth daily.      . simvastatin (ZOCOR) 80 MG tablet Take 80  mg by mouth at bedtime.        Marland Kitchen terazosin (HYTRIN) 5 MG capsule Take 5 mg by mouth at bedtime.        Marland Kitchen warfarin (COUMADIN) 5 MG tablet as directed.           Review of Systems  Constitutional:   No  weight loss, night sweats,  Fevers, chills, fatigue, lassitude. HEENT:   No headaches,  Difficulty swallowing,  Tooth/dental problems,  Sore throat,                No sneezing, itching, ear ache, nasal congestion, post nasal drip,   CV:  No chest pain,  Orthopnea, PND, swelling in lower extremities, anasarca, dizziness, palpitations  GI  No heartburn, indigestion, abdominal pain, nausea, vomiting, diarrhea, change in bowel habits, loss of appetite  Resp: Notes shortness of breath with exertion and  at rest.  No excess mucus, notes  productive cough,  No non-productive cough,  No coughing up of blood.  No change in color of mucus.  Notes wheezing.  No chest wall deformity  Skin: no rash or lesions.  GU: no dysuria, change in color of urine, no urgency or frequency.  No flank pain.  MS:  No joint pain or swelling.  No decreased range of motion.  No back pain.  Psych:  No change in mood or affect. No depression or anxiety.  No memory loss.     Objective:   Physical Exam  Filed Vitals:   09/26/11 1106  BP: 114/62  Pulse: 75  Temp: 96.6 F (35.9 C)  TempSrc: Oral  Height: 5\' 11"  (1.803 m)  Weight: 194 lb 9.6 oz (88.27 kg)  SpO2: 96%    Gen: Pleasant, well-nourished, in no distress,  normal affect, elderly   ENT: No lesions,  mouth clear,  oropharynx clear, no postnasal drip  Neck: No JVD, no TMG, no carotid bruits  Lungs: No use of accessory muscles, no dullness to percussion, coarse BS  Cardiovascular: RRR, heart sounds normal, no murmur or gallops, no peripheral edema  Abdomen: soft and NT, no HSM,  BS normal  Musculoskeletal: No deformities, no cyanosis or clubbing  Neuro: alert, non focal  Skin: Warm, no lesions or rashes   PFT Conversion 11/15/2007  FVC 3.56    FVC PREDICT 4.31  FVC  % Predicted  82  FEV1 2.85  FEV1 PREDICT 3.35  FEV % Predicted 84  FEV1/FVC 80.1  FEV1/FVC PRE 77  FeF 25-75 3.21  FeF 25-75 % Predicted 2.7  FEF % EXPEC 84  PFT RSLT normal  Note CXR 10/12:  No active disease Note pulmonary functions improved by 10% after inhaled steroid initiated FEV1 going from 56% predicted to 66% predicted    Assessment & Plan:   No problem-specific assessment & plan notes found for this encounter.   Updated Medication List Outpatient Encounter Prescriptions as of 09/26/2011  Medication Sig Dispense Refill  . albuterol (PROVENTIL HFA;VENTOLIN HFA) 108 (90 BASE) MCG/ACT inhaler Inhale 2 puffs into the lungs every 4 (four) hours as needed.        Marland Kitchen amLODipine (NORVASC) 5 MG tablet Take 5 mg by mouth daily.        . budesonide (PULMICORT FLEXHALER) 180 MCG/ACT inhaler Inhale 2 puffs into the lungs 2 (two) times daily.  1 each  5  . Cholecalciferol (VITAMIN D3) 1000 UNITS CAPS Take 2 capsules by mouth daily.       . famotidine (PEPCID) 20 MG tablet One at bedtime      . furosemide (LASIX) 40 MG tablet Take 40 mg by mouth daily.        Marland Kitchen LORazepam (ATIVAN) 0.5 MG tablet Take 0.5 mg by mouth every 6 (six) hours as needed.        . metoprolol tartrate (LOPRESSOR) 25 MG tablet Take 25 mg by mouth daily.        Marland Kitchen olmesartan (BENICAR) 20 MG tablet Take 20 mg by mouth daily.        Marland Kitchen omeprazole (PRILOSEC) 40 MG capsule Take 1 capsule (40 mg total) by mouth daily.      . simvastatin (ZOCOR) 80 MG tablet Take 80 mg by mouth at bedtime.        Marland Kitchen terazosin (HYTRIN) 5 MG capsule Take 5 mg by mouth at bedtime.        Marland Kitchen warfarin (COUMADIN) 5 MG tablet as directed.

## 2011-09-26 NOTE — Assessment & Plan Note (Signed)
Nocturnal desaturations on O2 with episode of dyspne last night with O2 tubing was off Resolved with O2 .  Today in office back to baseline w/ normal sats at 96%.  Cont on current regimen.  follow up Dr. Delford Field  As planned and As needed

## 2011-09-26 NOTE — Patient Instructions (Addendum)
Continue on current regimen.  Wear Oxygen at night.  May get an oxygen sensor (pulse oximeter) at medical supply company or pharmacy to check oxygen level if you would like.  follow up Dr. Delford Field  As planned and As needed   Please contact office for sooner follow up if symptoms do not improve or worsen or seek emergency care

## 2011-09-29 ENCOUNTER — Emergency Department (HOSPITAL_COMMUNITY): Payer: Medicare Other

## 2011-09-29 ENCOUNTER — Telehealth: Payer: Self-pay | Admitting: Critical Care Medicine

## 2011-09-29 ENCOUNTER — Other Ambulatory Visit: Payer: Self-pay

## 2011-09-29 ENCOUNTER — Encounter (HOSPITAL_COMMUNITY): Payer: Self-pay | Admitting: Emergency Medicine

## 2011-09-29 ENCOUNTER — Inpatient Hospital Stay (HOSPITAL_COMMUNITY)
Admission: EM | Admit: 2011-09-29 | Discharge: 2011-10-08 | DRG: 286 | Disposition: A | Discharge status: Home Health | Payer: Medicare Other | Attending: Cardiovascular Disease | Admitting: Cardiovascular Disease

## 2011-09-29 DIAGNOSIS — I10 Essential (primary) hypertension: Secondary | ICD-10-CM | POA: Diagnosis present

## 2011-09-29 DIAGNOSIS — K219 Gastro-esophageal reflux disease without esophagitis: Secondary | ICD-10-CM | POA: Diagnosis present

## 2011-09-29 DIAGNOSIS — N183 Chronic kidney disease, stage 3 unspecified: Secondary | ICD-10-CM | POA: Diagnosis present

## 2011-09-29 DIAGNOSIS — I447 Left bundle-branch block, unspecified: Secondary | ICD-10-CM | POA: Diagnosis present

## 2011-09-29 DIAGNOSIS — J4489 Other specified chronic obstructive pulmonary disease: Secondary | ICD-10-CM | POA: Diagnosis present

## 2011-09-29 DIAGNOSIS — I255 Ischemic cardiomyopathy: Secondary | ICD-10-CM | POA: Diagnosis present

## 2011-09-29 DIAGNOSIS — E785 Hyperlipidemia, unspecified: Secondary | ICD-10-CM | POA: Diagnosis present

## 2011-09-29 DIAGNOSIS — Z87891 Personal history of nicotine dependence: Secondary | ICD-10-CM

## 2011-09-29 DIAGNOSIS — I2789 Other specified pulmonary heart diseases: Secondary | ICD-10-CM | POA: Diagnosis present

## 2011-09-29 DIAGNOSIS — I712 Thoracic aortic aneurysm, without rupture, unspecified: Secondary | ICD-10-CM | POA: Diagnosis present

## 2011-09-29 DIAGNOSIS — Z66 Do not resuscitate: Secondary | ICD-10-CM | POA: Diagnosis present

## 2011-09-29 DIAGNOSIS — J811 Chronic pulmonary edema: Secondary | ICD-10-CM

## 2011-09-29 DIAGNOSIS — Z7901 Long term (current) use of anticoagulants: Secondary | ICD-10-CM

## 2011-09-29 DIAGNOSIS — J449 Chronic obstructive pulmonary disease, unspecified: Secondary | ICD-10-CM | POA: Diagnosis present

## 2011-09-29 DIAGNOSIS — J8410 Pulmonary fibrosis, unspecified: Secondary | ICD-10-CM | POA: Diagnosis present

## 2011-09-29 DIAGNOSIS — I5023 Acute on chronic systolic (congestive) heart failure: Secondary | ICD-10-CM | POA: Diagnosis present

## 2011-09-29 DIAGNOSIS — I4891 Unspecified atrial fibrillation: Secondary | ICD-10-CM | POA: Diagnosis present

## 2011-09-29 DIAGNOSIS — I4729 Other ventricular tachycardia: Secondary | ICD-10-CM | POA: Diagnosis not present

## 2011-09-29 DIAGNOSIS — I359 Nonrheumatic aortic valve disorder, unspecified: Secondary | ICD-10-CM | POA: Diagnosis present

## 2011-09-29 DIAGNOSIS — I472 Ventricular tachycardia, unspecified: Secondary | ICD-10-CM | POA: Diagnosis not present

## 2011-09-29 DIAGNOSIS — I2510 Atherosclerotic heart disease of native coronary artery without angina pectoris: Secondary | ICD-10-CM | POA: Diagnosis present

## 2011-09-29 DIAGNOSIS — R0600 Dyspnea, unspecified: Secondary | ICD-10-CM

## 2011-09-29 DIAGNOSIS — I25810 Atherosclerosis of coronary artery bypass graft(s) without angina pectoris: Secondary | ICD-10-CM | POA: Diagnosis present

## 2011-09-29 DIAGNOSIS — I200 Unstable angina: Secondary | ICD-10-CM | POA: Diagnosis present

## 2011-09-29 DIAGNOSIS — I130 Hypertensive heart and chronic kidney disease with heart failure and stage 1 through stage 4 chronic kidney disease, or unspecified chronic kidney disease: Principal | ICD-10-CM | POA: Diagnosis present

## 2011-09-29 DIAGNOSIS — I2589 Other forms of chronic ischemic heart disease: Secondary | ICD-10-CM | POA: Diagnosis present

## 2011-09-29 DIAGNOSIS — E876 Hypokalemia: Secondary | ICD-10-CM | POA: Diagnosis not present

## 2011-09-29 DIAGNOSIS — Z79899 Other long term (current) drug therapy: Secondary | ICD-10-CM

## 2011-09-29 DIAGNOSIS — I509 Heart failure, unspecified: Secondary | ICD-10-CM | POA: Diagnosis present

## 2011-09-29 DIAGNOSIS — I252 Old myocardial infarction: Secondary | ICD-10-CM

## 2011-09-29 DIAGNOSIS — I2582 Chronic total occlusion of coronary artery: Secondary | ICD-10-CM | POA: Diagnosis present

## 2011-09-29 HISTORY — DX: Ischemic cardiomyopathy: I25.5

## 2011-09-29 HISTORY — DX: Thoracic aortic aneurysm, without rupture: I71.2

## 2011-09-29 HISTORY — DX: Aneurysm of the ascending aorta, without rupture: I71.21

## 2011-09-29 HISTORY — DX: Chronic obstructive pulmonary disease, unspecified: J44.9

## 2011-09-29 LAB — CBC
MCH: 30.8 pg (ref 26.0–34.0)
MCV: 87.7 fL (ref 78.0–100.0)
Platelets: 157 10*3/uL (ref 150–400)
RBC: 4.54 MIL/uL (ref 4.22–5.81)
RDW: 12.8 % (ref 11.5–15.5)

## 2011-09-29 LAB — POCT I-STAT, CHEM 8
BUN: 33 mg/dL — ABNORMAL HIGH (ref 6–23)
Calcium, Ion: 1.12 mmol/L (ref 1.12–1.32)
Creatinine, Ser: 1.6 mg/dL — ABNORMAL HIGH (ref 0.50–1.35)
Glucose, Bld: 138 mg/dL — ABNORMAL HIGH (ref 70–99)
Hemoglobin: 13.9 g/dL (ref 13.0–17.0)
Sodium: 142 mEq/L (ref 135–145)
TCO2: 23 mmol/L (ref 0–100)

## 2011-09-29 LAB — PRO B NATRIURETIC PEPTIDE: Pro B Natriuretic peptide (BNP): 8388 pg/mL — ABNORMAL HIGH (ref 0–450)

## 2011-09-29 NOTE — H&P (Signed)
PCP:   [name], MD, MD  CARDILOGY: [facility]) Dr. Rayfield Citizen  Chief Complaint:   Shortness of breath  HPI: [name] [name] is a 78 y.o. male   has a past medical history of HTN (hypertension); GERD (gastroesophageal reflux disease); Hyperlipidemia; Hypertensive cardiovascular disease; CAD (coronary artery disease); MI (myocardial infarction) (1989); Atrial fibrillation (1/09); and Pulmonary granuloma.   Presented with one day history of worsening shortness of breath.  Today his chronic shortness of breath became worse, he called Dr. Orvilla Cornwall office (pulmonogy) and was told to be evaluated in ER. He reports no chest pain, no cough, no wheezing. Just today he developed severe dyspnea on exertion that is worse than his base line. Taking extra dose of lasix seemed to help. He has a history of respiratory distress after his CABG in 1996 requiring tracheostomy. Currently he is only requiring 2 L of O2 while asleep.  Review of Systems:    Pertinent positives include: shortness of breath at rest. dyspnea on exertion, Orthopnea, leg edema  Constitutional:  No weight loss, night sweats, Fevers, chills, fatigue.  HEENT:  No headaches, Difficulty swallowing,Tooth/dental problems,Sore throat,  No sneezing, itching, ear ache, nasal congestion, post nasal drip,  Cardio-vascular:  No chest pain, , PND, anasarca, dizziness, palpitations.no Bilateral lower extremity swelling  GI:  No heartburn, indigestion, abdominal pain, nausea, vomiting, diarrhea, change in bowel habits, loss of appetite, melena, blood in stool, hematoemesis Resp:  no  No  No excess mucus, no productive cough, No non-productive cough, No coughing up of blood.No change in color of mucus.No wheezing.No chest wall deformity  Skin:  no rash or lesions.  GU:  no dysuria, change in color of urine, no urgency or frequency. No flank pain.  Musculoskeletal:  No joint pain or swelling. No decreased range of motion. No back pain.  Psych:  No  change in mood or affect. No depression or anxiety. No memory loss.  Neuro: no localizing neurological complaints, no tingling, no weakness, no double vision, no gait abnormality, no slurred speech   Otherwise ROS are negative except for above, 10 systems were reviewed  Past Medical History: Past Medical History  Diagnosis Date  . HTN (hypertension)   . GERD (gastroesophageal reflux disease)   . Hyperlipidemia   . Hypertensive cardiovascular disease   . CAD (coronary artery disease)   . MI (myocardial infarction) 1989  . Atrial fibrillation 1/09    s/p DCCV to NSR   . Pulmonary granuloma     left   Past Surgical History  Procedure Date  . Coronary artery bypass graft 1996  . Stomach surgery   . Mastoidectomy     right ear     Medications: Prior to Admission medications   Medication Sig Start Date End Date Taking? Authorizing Provider  albuterol (PROVENTIL HFA;VENTOLIN HFA) 108 (90 BASE) MCG/ACT inhaler Inhale 2 puffs into the lungs every 4 (four) hours as needed. For shortness of breath   Yes Historical Provider, MD  amLODipine (NORVASC) 5 MG tablet Take 5 mg by mouth daily.     Yes Historical Provider, MD  budesonide (PULMICORT) 180 MCG/ACT inhaler Inhale 2 puffs into the lungs 2 (two) times daily. 06/09/11 06/08/12 Yes Shan Levans, MD  Cholecalciferol (VITAMIN D3) 1000 UNITS CAPS Take 2 capsules by mouth daily.    Yes Historical Provider, MD  famotidine (PEPCID) 20 MG tablet Take 20 mg by mouth at bedtime. One at bedtime 03/03/11 03/02/12 Yes Sandrea Hughs, MD  furosemide (LASIX) 40 MG tablet  Take 40 mg by mouth daily.     Yes Historical Provider, MD  LORazepam (ATIVAN) 0.5 MG tablet Take 0.5 mg by mouth every 6 (six) hours as needed. For anxiety   Yes Historical Provider, MD  metoprolol tartrate (LOPRESSOR) 25 MG tablet Take 12.5-25 mg by mouth daily. Take 25 mg in morning and 12.5 mg at night   Yes Historical Provider, MD  simvastatin (ZOCOR) 80 MG tablet Take 80 mg by mouth  at bedtime.     Yes Historical Provider, MD  terazosin (HYTRIN) 5 MG capsule Take 5 mg by mouth at bedtime.     Yes Historical Provider, MD  warfarin (COUMADIN) 5 MG tablet Take 2.5 mg by mouth daily.    Yes Historical Provider, MD    Allergies:   Allergies  Allergen Reactions  . Cimetidine     REACTION: unknown reaction, pt states per Dr. Jarold Motto  . Ranitidine Hcl     REACTION: unknown reaction, pt states per Dr. Jarold Motto    Social History:  Ambulatory independently Lives at home alone   reports that he quit smoking about 23 years ago. His smoking use included Cigarettes and Cigars. He has a 22.5 pack-year smoking history. He has never used smokeless tobacco. He reports that he drinks alcohol. He reports that he does not use illicit drugs.   Family History: family history includes Hypertension in his father and sister and Liver cancer in his sister.    Physical Exam: Patient Vitals for the past 24 hrs:  BP Temp Temp src Pulse Resp SpO2  09/29/11 2323 152/74 mmHg - - - 19  97 %  09/29/11 2147 153/81 mmHg - - 91  21  94 %  09/29/11 2053 153/82 mmHg 97.4 F (36.3 C) Oral 95  20  96 %    1. General:  in No Acute distress 2. Psychological: Alert and Oriented 3. Head/ENT:   Moist Mucous Membranes                          Head Non traumatic, neck supple                          Normal Dentition 4. SKIN: normal Skin turgor,  Skin clean Dry and intact no rash 5. Heart: irregular rate and rhythm no Murmur, Rub or gallop 6. Lungs: no wheezes mild crackles   7. Abdomen: Soft, non-tender, Non distended 8. Lower extremities: no clubbing, cyanosis, trace edema 9. Neurologically Grossly intact, moving all 4 extremities equally 10. MSK: Normal range of motion  body mass index is unknown because there is no height or weight on file.   Labs on Admission:   [facility] 09/29/11 2224  NA 142  K 3.7  CL 108  CO2 --  GLUCOSE 138*  BUN 33*  CREATININE 1.60*  CALCIUM --  MG --    PHOS --   No results found for this basename: AST:2,ALT:2,ALKPHOS:2,BILITOT:2,PROT:2,ALBUMIN:2 in the last 72 hours No results found for this basename: LIPASE:2,AMYLASE:2 in the last 72 hours  Basename 09/29/11 2224 09/29/11 2206  WBC -- 8.7  NEUTROABS -- --  HGB 13.9 14.0  HCT 41.0 39.8  MCV -- 87.7  PLT -- 157   No results found for this basename: CKTOTAL:3,CKMB:3,CKMBINDEX:3,TROPONINI:3 in the last 72 hours No results found for this basename: TSH,T4TOTAL,FREET3,T3FREE,THYROIDAB in the last 72 hours No results found for this basename: VITAMINB12:2,FOLATE:2,FERRITIN:2,TIBC:2,IRON:2,RETICCTPCT:2 in the last 72 hours  No results found for this basename: HGBA1C    The CrCl is unknown because both a height and weight (above a minimum accepted value) are required for this calculation. ABG    Component Value Date/Time   TCO2 23 09/29/2011 2224     No results found for this basename: DDIMER     Other results:  I have pearsonaly reviewed this: ECG REPORT   Rhythm: atrial fibrilation ST&T Change: Nonspecific ST segment changes no significant change from prior BNP 8388.0   Cultures: No results found for this basename: sdes, specrequest, cult, reptstatus       Radiological Exams on Admission: Dg Chest 2 View  09/29/2011  *RADIOLOGY REPORT*  Clinical Data: Dyspnea.  CHEST - 2 VIEW  Comparison: 11/12/2010 and a CT scan dated 03/16/2011  Findings: There is a small but increased right pleural effusion. There is cardiomegaly.  Interstitial markings are diffusely more accentuated than on the prior study.  This is superimposed on chronic lung disease.  Old well healed fractures of two right posterior ribs.  Evidence of prior CABG.  IMPRESSION: Increased small right pleural effusion.  Interstitial accentuation which may represent mild pulmonary edema superimposed on chronic lung disease.  Original Report Authenticated By: Gwynn Burly, M.D.    Assessment/Plan 78 year old gentleman  with history of coronary disease presents with likely heart failure exacerbation. Present on Admission:  .CHF (congestive heart failure) - - admit on telemetry, cycle cardiac enzymes, obtain serial ECG, to evaluate for ischemia as a cause of heart failure  monitor daily weight  diurese with doubling of lasix and monitor orthostatics and creatinine to avoid over diuresis.  Order echogram to evaluate EF and valves Make sure patient is on ACE/ARBi  Would get cardiology consult  Given sudden onset would evaluate for other causes of shortness of breath, will obtain d.dimer, patient is therapeutic on coumadin .CORONARY HEART DISEASE - cycle cardiac enzymes .Atrial fibrillation - continue coumadin and betablockers .HYPERTENSION - continue home medications CKD -  his cr at baseline was 1.37 last march his current cr. Is slightly up at 1.6 would watch while on higher dose of lasix to avoid over diuresis. If any evidence of K increase would stop ARB Prophylaxis:  On coumadin, Protonix  CODE STATUS: DNR/DNI as per patient wishes.   Gabrielle Mester 09/29/2011, 11:59 PM

## 2011-09-29 NOTE — ED Notes (Signed)
Pt c/o shortness of breath, st's he has felt this way for past 6 months.  St's this am he became more short of breath with walking.  Pt has appt on Feb 5 but felt like he needed to be seen sooner.

## 2011-09-29 NOTE — Telephone Encounter (Signed)
Spoke with pt. He states that his breathing continues to worsen since ov with TP 09/26/11. He states he had a lot of SOB last night and today, esp with exertion. I advised that he needs to be evaluated asap and go to nearest UC or ED. Pt verbalized understanding. Will forward to PW so that he is aware.

## 2011-09-29 NOTE — ED Notes (Signed)
Pt st's he took a extra Lasix approx 1 hr before coming to ED and voided lg amount. St's his breathing has been better since then

## 2011-09-29 NOTE — ED Provider Notes (Signed)
History     CSN: [id number]  Arrival date & time 09/29/11  1955   First MD Initiated Contact with Patient 09/29/11 2141      Chief Complaint  Patient presents with  . Shortness of Breath   very pleasant gentleman with a long-standing history of COPD, obstructive sleep apnea, and chronic lung problems. States he does wear his BiPAP at night. He states he normally feels better in the morning when he wakes up however, today he has been having continued shortness of breath throughout the day. He did call his pulmonologist, Dr. Delford Field in the office instructed him to come in prior to coming in. He did take 80 mg of Lasix and states he has urinated quite a bit since then. He states he feels "100% better". He's had no chest pain. No fever. No increased pedal edema. No cough. Also took symbicort today but NOT albuterol.  (Consider location/radiation/quality/duration/timing/severity/associated sxs/prior treatment) HPI  Past Medical History  Diagnosis Date  . HTN (hypertension)   . GERD (gastroesophageal reflux disease)   . Hyperlipidemia   . Hypertensive cardiovascular disease   . CAD (coronary artery disease)   . MI (myocardial infarction) 1989  . Atrial fibrillation 1/09    s/p DCCV to NSR   . Pulmonary granuloma     left    Past Surgical History  Procedure Date  . Coronary artery bypass graft 1996  . Stomach surgery   . Mastoidectomy     right ear    Family History  Problem Relation Age of Onset  . Liver cancer Sister     History  Substance Use Topics  . Smoking status: Former Smoker -- 0.5 packs/day for 45 years    Types: Cigarettes, Cigars    Quit date: 12/26/1987  . Smokeless tobacco: Never Used  . Alcohol Use: Yes     Occas Beer      Review of Systems  All other systems reviewed and are negative.    Allergies  Cimetidine and Ranitidine hcl  Home Medications   Current Outpatient Rx  Name Route Sig Dispense Refill  . ALBUTEROL SULFATE HFA 108 (90 BASE)  MCG/ACT IN AERS Inhalation Inhale 2 puffs into the lungs every 4 (four) hours as needed. For shortness of breath    . AMLODIPINE BESYLATE 5 MG PO TABS Oral Take 5 mg by mouth daily.      . BUDESONIDE 180 MCG/ACT IN AEPB Inhalation Inhale 2 puffs into the lungs 2 (two) times daily.    Marland Kitchen VITAMIN D3 1000 UNITS PO CAPS Oral Take 2 capsules by mouth daily.     Marland Kitchen FAMOTIDINE 20 MG PO TABS Oral Take 20 mg by mouth at bedtime. One at bedtime    . FUROSEMIDE 40 MG PO TABS Oral Take 40 mg by mouth daily.      Marland Kitchen LORAZEPAM 0.5 MG PO TABS Oral Take 0.5 mg by mouth every 6 (six) hours as needed. For anxiety    . METOPROLOL TARTRATE 25 MG PO TABS Oral Take 12.5-25 mg by mouth daily. Take 25 mg in morning and 12.5 mg at night    . SIMVASTATIN 80 MG PO TABS Oral Take 80 mg by mouth at bedtime.      . TERAZOSIN HCL 5 MG PO CAPS Oral Take 5 mg by mouth at bedtime.      . WARFARIN SODIUM 5 MG PO TABS Oral Take 2.5 mg by mouth daily.       BP 153/81  Pulse  91  Temp(Src) 97.4 F (36.3 C) (Oral)  Resp 21  SpO2 94%  Physical Exam  Nursing note and vitals reviewed. Constitutional: He is oriented to person, place, and time. He appears well-developed and well-nourished.  HENT:  Head: Normocephalic and atraumatic.  Eyes: Conjunctivae and EOM are normal. Pupils are equal, round, and reactive to light.  Neck: Neck supple.  Cardiovascular: Normal rate and regular rhythm.  Exam reveals no gallop and no friction rub.   No murmur heard. Pulmonary/Chest: Breath sounds normal. He has no wheezes. He has no rales. He exhibits no tenderness.       No frank wheeze, transmission of upper airway sounds  Abdominal: Soft. Bowel sounds are normal. He exhibits no distension. There is no tenderness. There is no rebound and no guarding.  Musculoskeletal: Normal range of motion. He exhibits no edema and no tenderness.  Neurological: He is alert and oriented to person, place, and time. No cranial nerve deficit. Coordination normal.    Skin: Skin is warm and dry. No rash noted.  Psychiatric: He has a normal mood and affect.    ED Course  Procedures (including critical care time)  Labs Reviewed - No data to display No results found.   No diagnosis found.    MDM  Patient is seen and examined, initial history and physical is completed. Evaluation initiated      Results for orders placed during the hospital encounter of 09/29/11  CBC      Component Value Range   WBC 8.7  4.0 - 10.5 (K/uL)   RBC 4.54  4.22 - 5.81 (MIL/uL)   Hemoglobin 14.0  13.0 - 17.0 (g/dL)   HCT 16.1  09.6 - 04.5 (%)   MCV 87.7  78.0 - 100.0 (fL)   MCH 30.8  26.0 - 34.0 (pg)   MCHC 35.2  30.0 - 36.0 (g/dL)   RDW 40.9  81.1 - 91.4 (%)   Platelets 157  150 - 400 (K/uL)  PRO B NATRIURETIC PEPTIDE      Component Value Range   Pro B Natriuretic peptide (BNP) 8388.0 (*) 0 - 450 (pg/mL)  PROTIME-INR      Component Value Range   Prothrombin Time 24.2 (*) 11.6 - 15.2 (seconds)   INR 2.13 (*) 0.00 - 1.49   POCT I-STAT, CHEM 8      Component Value Range   Sodium 142  135 - 145 (mEq/L)   Potassium 3.7  3.5 - 5.1 (mEq/L)   Chloride 108  96 - 112 (mEq/L)   BUN 33 (*) 6 - 23 (mg/dL)   Creatinine, Ser 7.82 (*) 0.50 - 1.35 (mg/dL)   Glucose, Bld 956 (*) 70 - 99 (mg/dL)   Calcium, Ion 2.13  0.86 - 1.32 (mmol/L)   TCO2 23  0 - 100 (mmol/L)   Hemoglobin 13.9  13.0 - 17.0 (g/dL)   HCT 57.8  46.9 - 62.9 (%)  CBC      Component Value Range   WBC 9.0  4.0 - 10.5 (K/uL)   RBC 4.54  4.22 - 5.81 (MIL/uL)   Hemoglobin 13.8  13.0 - 17.0 (g/dL)   HCT 52.8  41.3 - 24.4 (%)   MCV 87.2  78.0 - 100.0 (fL)   MCH 30.4  26.0 - 34.0 (pg)   MCHC 34.8  30.0 - 36.0 (g/dL)   RDW 01.0  27.2 - 53.6 (%)   Platelets 164  150 - 400 (K/uL)  DIFFERENTIAL      Component Value  Range   Neutrophils Relative 61  43 - 77 (%)   Neutro Abs 5.5  1.7 - 7.7 (K/uL)   Lymphocytes Relative 26  12 - 46 (%)   Lymphs Abs 2.3  0.7 - 4.0 (K/uL)   Monocytes Relative 11  3 - 12  (%)   Monocytes Absolute 0.9  0.1 - 1.0 (K/uL)   Eosinophils Relative 3  0 - 5 (%)   Eosinophils Absolute 0.2  0.0 - 0.7 (K/uL)   Basophils Relative 0  0 - 1 (%)   Basophils Absolute 0.0  0.0 - 0.1 (K/uL)  CARDIAC PANEL(CRET KIN+CKTOT+MB+TROPI)      Component Value Range   Total CK 95  7 - 232 (U/L)   CK, MB 3.2  0.3 - 4.0 (ng/mL)   Troponin I 0.60 (*) <0.30 (ng/mL)   Relative Index RELATIVE INDEX IS INVALID  0.0 - 2.5   CARDIAC PANEL(CRET KIN+CKTOT+MB+TROPI)      Component Value Range   Total CK 90  7 - 232 (U/L)   CK, MB 3.1  0.3 - 4.0 (ng/mL)   Troponin I 0.61 (*) <0.30 (ng/mL)   Relative Index RELATIVE INDEX IS INVALID  0.0 - 2.5   TSH      Component Value Range   TSH 5.046 (*) 0.350 - 4.500 (uIU/mL)  D-DIMER, QUANTITATIVE      Component Value Range   D-Dimer, Quant 0.36  0.00 - 0.48 (ug/mL-FEU)   Dg Chest 2 View  09/29/2011  *RADIOLOGY REPORT*  Clinical Data: Dyspnea.  CHEST - 2 VIEW  Comparison: 11/12/2010 and a CT scan dated 03/16/2011  Findings: There is a small but increased right pleural effusion. There is cardiomegaly.  Interstitial markings are diffusely more accentuated than on the prior study.  This is superimposed on chronic lung disease.  Old well healed fractures of two right posterior ribs.  Evidence of prior CABG.  IMPRESSION: Increased small right pleural effusion.  Interstitial accentuation which may represent mild pulmonary edema superimposed on chronic lung disease.  Original Report Authenticated By: Gwynn Burly, M.D.    Admit to triad  Theron Arista A. Patrica Duel, MD 09/30/11 1456

## 2011-09-30 ENCOUNTER — Other Ambulatory Visit: Payer: Self-pay

## 2011-09-30 ENCOUNTER — Encounter (HOSPITAL_COMMUNITY): Payer: Self-pay | Admitting: Internal Medicine

## 2011-09-30 DIAGNOSIS — Z7901 Long term (current) use of anticoagulants: Secondary | ICD-10-CM

## 2011-09-30 LAB — CBC
Hemoglobin: 13.8 g/dL (ref 13.0–17.0)
MCH: 30.4 pg (ref 26.0–34.0)
Platelets: 164 10*3/uL (ref 150–400)
RBC: 4.54 MIL/uL (ref 4.22–5.81)
WBC: 9 10*3/uL (ref 4.0–10.5)

## 2011-09-30 LAB — DIFFERENTIAL
Basophils Absolute: 0 10*3/uL (ref 0.0–0.1)
Basophils Relative: 0 % (ref 0–1)
Eosinophils Relative: 3 % (ref 0–5)
Lymphocytes Relative: 26 % (ref 12–46)
Lymphs Abs: 2.3 10*3/uL (ref 0.7–4.0)
Monocytes Absolute: 0.9 10*3/uL (ref 0.1–1.0)
Monocytes Relative: 11 % (ref 3–12)
Neutro Abs: 5.5 10*3/uL (ref 1.7–7.7)
Neutrophils Relative %: 61 % (ref 43–77)

## 2011-09-30 LAB — CARDIAC PANEL(CRET KIN+CKTOT+MB+TROPI)
CK, MB: 3.1 ng/mL (ref 0.3–4.0)
Relative Index: INVALID (ref 0.0–2.5)
Relative Index: INVALID (ref 0.0–2.5)
Troponin I: 0.61 ng/mL (ref <0.30)
Troponin I: 0.5 ng/mL (ref <0.30)

## 2011-09-30 LAB — PROTIME-INR: Prothrombin Time: 24.4 seconds — ABNORMAL HIGH (ref 11.6–15.2)

## 2011-09-30 MED ORDER — ALBUTEROL SULFATE HFA 108 (90 BASE) MCG/ACT IN AERS
2.0000 | INHALATION_SPRAY | RESPIRATORY_TRACT | Status: DC | PRN
  Filled 2011-09-30: qty 6.7

## 2011-09-30 MED ORDER — ACETAMINOPHEN 325 MG PO TABS
650.0000 mg | ORAL_TABLET | ORAL | Status: DC | PRN
  Filled 2011-09-30: qty 2

## 2011-09-30 MED ORDER — FAMOTIDINE 20 MG PO TABS
20.0000 mg | ORAL_TABLET | Freq: Every day | ORAL | Status: DC
  Administered 2011-09-30 – 2011-10-07 (×8): 20 mg via ORAL
  Filled 2011-09-30 (×9): qty 1

## 2011-09-30 MED ORDER — TERAZOSIN HCL 5 MG PO CAPS
5.0000 mg | ORAL_CAPSULE | Freq: Every day | ORAL | Status: DC
  Administered 2011-09-30 – 2011-10-07 (×8): 5 mg via ORAL
  Filled 2011-09-30 (×9): qty 1

## 2011-09-30 MED ORDER — FUROSEMIDE 10 MG/ML IJ SOLN
40.0000 mg | Freq: Two times a day (BID) | INTRAMUSCULAR | Status: AC
  Administered 2011-09-30 – 2011-10-04 (×10): 40 mg via INTRAVENOUS
  Filled 2011-09-30 (×12): qty 4

## 2011-09-30 MED ORDER — AMLODIPINE BESYLATE 5 MG PO TABS
5.0000 mg | ORAL_TABLET | Freq: Every day | ORAL | Status: DC
  Administered 2011-09-30 – 2011-10-04 (×5): 5 mg via ORAL
  Filled 2011-09-30 (×5): qty 1

## 2011-09-30 MED ORDER — SODIUM CHLORIDE 0.9 % IJ SOLN
3.0000 mL | Freq: Two times a day (BID) | INTRAMUSCULAR | Status: DC
  Administered 2011-09-30 (×3): 3 mL via INTRAVENOUS

## 2011-09-30 MED ORDER — SODIUM CHLORIDE 0.9 % IV SOLN
250.0000 mL | INTRAVENOUS | Status: DC | PRN
  Administered 2011-09-30: 250 mL via INTRAVENOUS

## 2011-09-30 MED ORDER — ONDANSETRON HCL 4 MG/2ML IJ SOLN: 4.0000 mg | Freq: Four times a day (QID) | INTRAMUSCULAR | Status: DC | PRN

## 2011-09-30 MED ORDER — METOPROLOL TARTRATE 25 MG PO TABS
25.0000 mg | ORAL_TABLET | Freq: Every day | ORAL | Status: DC
  Administered 2011-09-30 – 2011-10-04 (×5): 25 mg via ORAL
  Filled 2011-09-30 (×5): qty 1

## 2011-09-30 MED ORDER — FLUTICASONE PROPIONATE HFA 44 MCG/ACT IN AERO
2.0000 | INHALATION_SPRAY | Freq: Two times a day (BID) | RESPIRATORY_TRACT | Status: DC
  Administered 2011-09-30 – 2011-10-07 (×17): 2 via RESPIRATORY_TRACT
  Filled 2011-09-30: qty 10.6

## 2011-09-30 MED ORDER — SODIUM CHLORIDE 0.9 % IJ SOLN: 3.0000 mL | INTRAMUSCULAR | Status: DC | PRN

## 2011-09-30 MED ORDER — METOPROLOL TARTRATE 12.5 MG HALF TABLET
12.5000 mg | ORAL_TABLET | Freq: Every day | ORAL | Status: DC
  Administered 2011-09-30 – 2011-10-03 (×4): 12.5 mg via ORAL
  Filled 2011-09-30 (×5): qty 1

## 2011-09-30 MED ORDER — WARFARIN SODIUM 2.5 MG PO TABS
2.5000 mg | ORAL_TABLET | Freq: Once | ORAL | Status: DC
  Filled 2011-09-30: qty 1

## 2011-09-30 MED ORDER — ROSUVASTATIN CALCIUM 20 MG PO TABS
20.0000 mg | ORAL_TABLET | Freq: Every day | ORAL | Status: DC
  Administered 2011-09-30 – 2011-10-07 (×8): 20 mg via ORAL
  Filled 2011-09-30 (×10): qty 1

## 2011-09-30 MED ORDER — OLMESARTAN MEDOXOMIL 20 MG PO TABS
20.0000 mg | ORAL_TABLET | Freq: Every day | ORAL | Status: DC
  Administered 2011-09-30 – 2011-10-08 (×8): 20 mg via ORAL
  Filled 2011-09-30 (×9): qty 1

## 2011-09-30 MED ORDER — LORAZEPAM 0.5 MG PO TABS: 0.5000 mg | ORAL_TABLET | Freq: Four times a day (QID) | ORAL | Status: DC | PRN

## 2011-09-30 MED ORDER — FUROSEMIDE 40 MG PO TABS
40.0000 mg | ORAL_TABLET | Freq: Two times a day (BID) | ORAL | Status: DC
  Administered 2011-09-30: 40 mg via ORAL
  Filled 2011-09-30 (×3): qty 1

## 2011-09-30 MED ORDER — METOPROLOL TARTRATE 12.5 MG HALF TABLET: 12.5000 mg | ORAL_TABLET | Freq: Every day | ORAL | Status: DC

## 2011-09-30 MED ORDER — NITROGLYCERIN IN D5W 200-5 MCG/ML-% IV SOLN
10.0000 ug/min | INTRAVENOUS | Status: DC
  Administered 2011-09-30 – 2011-10-03 (×2): 10 ug/min via INTRAVENOUS
  Filled 2011-09-30 (×2): qty 250

## 2011-09-30 MED ORDER — NITROGLYCERIN 2 % TD OINT: 1.0000 [in_us] | TOPICAL_OINTMENT | Freq: Three times a day (TID) | TRANSDERMAL | Status: DC

## 2011-09-30 NOTE — Progress Notes (Signed)
Pt with order for iv lasix 40mg   & had received 40mg  po at 8am Dr. Janee Morn notified and instructed nurse to speak with [name] Annie Paras  PA to see when she want iv lasix started.  Sherian Rein informed & awaiting further instructions.  Immanuel Fedak,RN.

## 2011-09-30 NOTE — Telephone Encounter (Signed)
Call pt Monday and get him back in with me some time this week

## 2011-09-30 NOTE — Consult Note (Signed)
Reason for Consult: CHF   Referring Physician: Dr. Ramiro Harvest  PCP: Dr. [name] Cardiologist:  Dr. Susa Griffins Pul: Dr. Shan Levans   [name] [name] is an 78 y.o. male.    Chief Complaint:  Shortness of breath  HPI: [name] [name] is a 78 y.o. male with a past medical history of HTN (hypertension); GERD (gastroesophageal reflux disease); Hyperlipidemia; Hypertensive cardiovascular disease; CAD (coronary artery disease) with CABG in 1999 and last cath in 2004 with patent grafts; MI (myocardial infarction) (1989); Atrial fibrillation chronic on coumadin (1/09); and Pulmonary granuloma.  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  Presented with one day history of worsening shortness of breath.   09/29/11 his chronic shortness of breath became worse, he called Dr. Lynelle Doctor office (pulmonology) and was told to be evaluated in ER. He reports no chest pain, no cough, no wheezing.   He developed severe dyspnea on exertion that is worse than his base line on 09/29/11. Taking extra dose of lasix seemed to help. He has a history of respiratory distress (ARDS) after his CABG in 1996 requiring tracheostomy. Currently he is only requiring 2 L of O2 while asleep. He has COPD also that is progressive.   He states today that over several months he has slow increase of SOB until yesterday.  While lying flat at night he becomes SOB and has to sit up.    Pro-BNP was 8,388 on admit. 2 view CXR--- Increased small right pleural effusion. Interstitial accentuation  which may represent mild pulmonary edema superimposed on chronic  lung disease.  Troponin I is 0.60 to 0.61, Cr. 1.60 with BUN of 33. He has -903 I&O.  Other history as below.     Past Medical History  Diagnosis Date  . HTN (hypertension)   . GERD (gastroesophageal reflux disease)   . Hyperlipidemia   . Hypertensive cardiovascular disease   . CAD (coronary artery disease)   . MI (myocardial infarction) 1989  . Atrial fibrillation 1/09   s/p DCCV to NSR   . Pulmonary granuloma     left  . Ascending aortic aneurysm   . COPD (chronic obstructive pulmonary disease)   . Shortness of breath     Past Surgical History  Procedure Date  . Coronary artery bypass graft 1996  . Stomach surgery   . Mastoidectomy     right ear  . Cardiac catheterization     Family History  Problem Relation Age of Onset  . Liver cancer Sister   . Hypertension Sister   . Hypertension Father    Social History:  reports that he quit smoking about 23 years ago. His smoking use included Cigarettes and Cigars. He has a 22.5 pack-year smoking history. He has never used smokeless tobacco. He reports that he drinks alcohol. He reports that he does not use illicit drugs. Widowed, father of 4 with 5 grandchildren and 6 great grandchildren.  Allergies:  Allergies  Allergen Reactions  . Cimetidine     REACTION: unknown reaction, pt states per Dr. Jarold Motto  . Ranitidine Hcl     REACTION: unknown reaction, pt states per Dr. Jarold Motto    Medications Prior to Admission  Medication Dose Route Frequency Provider Last Rate Last Dose  . 0.9 %  sodium chloride infusion  250 mL Intravenous PRN Therisa Doyne, MD      . acetaminophen (TYLENOL) tablet 650 mg  650 mg Oral Q4H PRN Therisa Doyne, MD      . albuterol (PROVENTIL HFA;VENTOLIN  HFA) 108 (90 BASE) MCG/ACT inhaler 2 puff  2 puff Inhalation Q4H PRN Therisa Doyne, MD      . amLODipine (NORVASC) tablet 5 mg  5 mg Oral Daily Therisa Doyne, MD      . famotidine (PEPCID) tablet 20 mg  20 mg Oral QHS Therisa Doyne, MD      . fluticasone (FLOVENT HFA) 44 MCG/ACT inhaler 2 puff  2 puff Inhalation BID Therisa Doyne, MD   2 puff at 09/30/11 0750  . furosemide (LASIX) injection 40 mg  40 mg Intravenous Q12H Ramiro Harvest, MD      . LORazepam (ATIVAN) tablet 0.5 mg  0.5 mg Oral Q6H PRN Therisa Doyne, MD      . metoprolol tartrate (LOPRESSOR) tablet 12.5 mg  12.5 mg Oral QHS Hilario Quarry Amend, PHARMD      . metoprolol tartrate (LOPRESSOR) tablet 25 mg  25 mg Oral Daily Hilario Quarry Amend, PHARMD      . olmesartan (BENICAR) tablet 20 mg  20 mg Oral Daily Therisa Doyne, MD      . ondansetron (ZOFRAN) injection 4 mg  4 mg Intravenous Q6H PRN Therisa Doyne, MD      . rosuvastatin (CRESTOR) tablet 20 mg  20 mg Oral q1800 Therisa Doyne, MD      . sodium chloride 0.9 % injection 3 mL  3 mL Intravenous Q12H Therisa Doyne, MD   3 mL at 09/30/11 0204  . sodium chloride 0.9 % injection 3 mL  3 mL Intravenous PRN Therisa Doyne, MD      . terazosin (HYTRIN) capsule 5 mg  5 mg Oral QHS Therisa Doyne, MD      . warfarin (COUMADIN) tablet 2.5 mg  2.5 mg Oral ONCE-1800 Hilario Quarry Amend, PHARMD      . DISCONTD: furosemide (LASIX) tablet 40 mg  40 mg Oral BID Therisa Doyne, MD   40 mg at 09/30/11 0809  . DISCONTD: metoprolol tartrate (LOPRESSOR) tablet 12.5-25 mg  12.5-25 mg Oral Daily Therisa Doyne, MD       Medications Prior to Admission  Medication Sig Dispense Refill  . albuterol (PROVENTIL HFA;VENTOLIN HFA) 108 (90 BASE) MCG/ACT inhaler Inhale 2 puffs into the lungs every 4 (four) hours as needed. For shortness of breath      . amLODipine (NORVASC) 5 MG tablet Take 5 mg by mouth daily.        . budesonide (PULMICORT) 180 MCG/ACT inhaler Inhale 2 puffs into the lungs 2 (two) times daily.      . Cholecalciferol (VITAMIN D3) 1000 UNITS CAPS Take 2 capsules by mouth daily.       . famotidine (PEPCID) 20 MG tablet Take 20 mg by mouth at bedtime. One at bedtime      . furosemide (LASIX) 40 MG tablet Take 40 mg by mouth daily.        Marland Kitchen LORazepam (ATIVAN) 0.5 MG tablet Take 0.5 mg by mouth every 6 (six) hours as needed. For anxiety      . metoprolol tartrate (LOPRESSOR) 25 MG tablet Take 12.5-25 mg by mouth daily. Take 25 mg in morning and 12.5 mg at night      . simvastatin (ZOCOR) 80 MG tablet Take 80 mg by mouth at bedtime.        Marland Kitchen terazosin  (HYTRIN) 5 MG capsule Take 5 mg by mouth at bedtime.        Marland Kitchen warfarin (COUMADIN) 5 MG tablet Take 2.5 mg by mouth daily.  Results for orders placed during the hospital encounter of 09/29/11 (from the past 48 hour(s))  CBC     Status: Normal   Collection Time   09/29/11 10:06 PM      Component Value Range Comment   WBC 8.7  4.0 - 10.5 (K/uL)    RBC 4.54  4.22 - 5.81 (MIL/uL)    Hemoglobin 14.0  13.0 - 17.0 (g/dL)    HCT 16.1  09.6 - 04.5 (%)    MCV 87.7  78.0 - 100.0 (fL)    MCH 30.8  26.0 - 34.0 (pg)    MCHC 35.2  30.0 - 36.0 (g/dL)    RDW 40.9  81.1 - 91.4 (%)    Platelets 157  150 - 400 (K/uL)   PRO B NATRIURETIC PEPTIDE     Status: Abnormal   Collection Time   09/29/11 10:06 PM      Component Value Range Comment   Pro B Natriuretic peptide (BNP) 8388.0 (*) 0 - 450 (pg/mL)   PROTIME-INR     Status: Abnormal   Collection Time   09/29/11 10:06 PM      Component Value Range Comment   Prothrombin Time 24.2 (*) 11.6 - 15.2 (seconds)    INR 2.13 (*) 0.00 - 1.49    POCT I-STAT, CHEM 8     Status: Abnormal   Collection Time   09/29/11 10:24 PM      Component Value Range Comment   Sodium 142  135 - 145 (mEq/L)    Potassium 3.7  3.5 - 5.1 (mEq/L)    Chloride 108  96 - 112 (mEq/L)    BUN 33 (*) 6 - 23 (mg/dL)    Creatinine, Ser 7.82 (*) 0.50 - 1.35 (mg/dL)    Glucose, Bld 956 (*) 70 - 99 (mg/dL)    Calcium, Ion 2.13  1.12 - 1.32 (mmol/L)    TCO2 23  0 - 100 (mmol/L)    Hemoglobin 13.9  13.0 - 17.0 (g/dL)    HCT 08.6  57.8 - 46.9 (%)   CBC     Status: Normal   Collection Time   09/30/11  1:35 AM      Component Value Range Comment   WBC 9.0  4.0 - 10.5 (K/uL)    RBC 4.54  4.22 - 5.81 (MIL/uL)    Hemoglobin 13.8  13.0 - 17.0 (g/dL)    HCT 62.9  52.8 - 41.3 (%)    MCV 87.2  78.0 - 100.0 (fL)    MCH 30.4  26.0 - 34.0 (pg)    MCHC 34.8  30.0 - 36.0 (g/dL)    RDW 24.4  01.0 - 27.2 (%)    Platelets 164  150 - 400 (K/uL)   DIFFERENTIAL     Status: Normal   Collection Time    09/30/11  1:35 AM      Component Value Range Comment   Neutrophils Relative 61  43 - 77 (%)    Neutro Abs 5.5  1.7 - 7.7 (K/uL)    Lymphocytes Relative 26  12 - 46 (%)    Lymphs Abs 2.3  0.7 - 4.0 (K/uL)    Monocytes Relative 11  3 - 12 (%)    Monocytes Absolute 0.9  0.1 - 1.0 (K/uL)    Eosinophils Relative 3  0 - 5 (%)    Eosinophils Absolute 0.2  0.0 - 0.7 (K/uL)    Basophils Relative 0  0 - 1 (%)    Basophils  Absolute 0.0  0.0 - 0.1 (K/uL)   CARDIAC PANEL(CRET KIN+CKTOT+MB+TROPI)     Status: Abnormal   Collection Time   09/30/11  1:35 AM      Component Value Range Comment   Total CK 95  7 - 232 (U/L)    CK, MB 3.2  0.3 - 4.0 (ng/mL)    Troponin I 0.60 (*) <0.30 (ng/mL)    Relative Index RELATIVE INDEX IS INVALID  0.0 - 2.5    D-DIMER, QUANTITATIVE     Status: Normal   Collection Time   09/30/11  1:35 AM      Component Value Range Comment   D-Dimer, Quant 0.36  0.00 - 0.48 (ug/mL-FEU)   CARDIAC PANEL(CRET KIN+CKTOT+MB+TROPI)     Status: Abnormal   Collection Time   09/30/11  8:46 AM      Component Value Range Comment   Total CK 90  7 - 232 (U/L)    CK, MB 3.1  0.3 - 4.0 (ng/mL)    Troponin I 0.61 (*) <0.30 (ng/mL)    Relative Index RELATIVE INDEX IS INVALID  0.0 - 2.5     Dg Chest 2 View  09/29/2011  *RADIOLOGY REPORT*  Clinical Data: Dyspnea.  CHEST - 2 VIEW  Comparison: 11/12/2010 and a CT scan dated 03/16/2011  Findings: There is a small but increased right pleural effusion. There is cardiomegaly.  Interstitial markings are diffusely more accentuated than on the prior study.  This is superimposed on chronic lung disease.  Old well healed fractures of two right posterior ribs.  Evidence of prior CABG.  IMPRESSION: Increased small right pleural effusion.  Interstitial accentuation which may represent mild pulmonary edema superimposed on chronic lung disease.  Original Report Authenticated By: Gwynn Burly, M.D.    ROS: General:No colds, fevers, no night sweats. Skin:no  rashes HEENT:no blurred vision ZO:XWRUEA chest pain PUL:SOB, he took extra lasix and felt better by the time he arrived at hospital GI:no diarrhea, no melena GU:no dysuria or hematuria MS:no joint pain Neuro:no syncope, no dizziness Endo:negaitve   Blood pressure 136/73, pulse 83, temperature 97.8 F (36.6 C), temperature source Oral, resp. rate 21, height 5\' 11"  (1.803 m), weight 85 kg (187 lb 6.3 oz), SpO2 97.00%. PE: General:A&O Pleasant affect. Skin:W&D brrisk capillary refill HEENT:normocephalic, sclera clear Neck: JVP up 6-8 cm, prompt HJR; no carotid or subclavian bruits Heart: irregular, normal S1 and S2, barely audible apical systolic murmur Lungs:clear, diminished breath sounds throughout Abd:non tender, not distended, no abnormal pulsatility or bruits Ext: no edema, thready pedal pulses, 3+ femoral and radial pulses, 2+ femoral bruits Neuro:A&O X 3 MAE, follows commands    Assessment/Plan Patient Active Problem List  Diagnoses  . HYPERLIPIDEMIA  . GOUTY ARTHROPATHY  . HYPERTENSION  . HYPERTENSIVE CARDIOVASCULAR DISEASE  . CORONARY HEART DISEASE, CABG 1999, Last cath 01/2003 with patent grafts, LIMA to LAD, seq. SVG to diagonal and OD, seq. SVG to 2 marginals, and triple sequential SVG to 2 PDA branches and PLA.    Marland Kitchen Atrial fibrillation, chronic- permanent  . GASTROESOPHAGEAL REFLUX DISEASE, CHRONIC  . PEPTIC STRICTURE  . HIATAL HERNIA  . Cough  . GERD (gastroesophageal reflux disease)  . Stricture esophagus  . Gastritis, chronic  . CHF (congestive heart failure), EF 40% with myoview 09/2009  . CKD (chronic kidney disease)  . Chronic anticoagulation, secondary to Chronic a. fib.   PLAN:Lasix has been changed to 40 mg IV every 12 hours. INR was therapeutic on admit. D-dimer negative.  Cr. In OCT. 2012 was 1.68 with BUN of 19.  Coumadin levels followed at [facility]. Our last OV note has different dosage on Meds from Home med list here. INGOLD,LAURA  R 09/30/2011, 10:22 AM  I have seen and examined the patient along with [facility] R, NP.  I have reviewed the chart, notes and new data.  I agree with NP's note.  Key new complaints: Describes classic PND and orthopnea. Gradual progression over weeks, but abruptly worse just prior to admission. Dyspnea has improved, but he still has some orthopnea. Denies angina. Key examination changes: AFib, don't hear S3 or rales, but JVP is up 6-8 cm Key new findings / data: Minimal increase in cTnI may be due to CHF/high LVEDP, but may indicate unstable coronary sd. As well.  PLAN: Suspect acute on chronic HF due to new coronary insufficiency (graft closure?) Needs additional diuresis. Vasodilators - NTG paste Echo today: if EF lower, proceed directly to right and left heart cath. If he undergoes angiography will have to rehydrate night before cath (currently creat at baseline of 1.6) and hold ARB on day of cath Hold warfarin, switch to iv heparin in anticipation of possible cath  Thurmon Fair, MD, [facility] [phone] 09/30/2011, 11:14 AM

## 2011-09-30 NOTE — Progress Notes (Signed)
Pt received HF packet & initiated teaching.  Pt verbalized understanding.  Maeola Sarah, RN

## 2011-09-30 NOTE — Progress Notes (Signed)
Instructed by L. Ingold PA ok to start iv lasix now.  Amanda Pea, RN

## 2011-09-30 NOTE — Progress Notes (Signed)
Subjective: Patient states shortness of breath has improved. Patient denies any chest pain.  Objective: Vital signs in last 24 hours: Filed Vitals:   09/30/11 0233 09/30/11 0539 09/30/11 0752 09/30/11 0800  BP:  143/70    Pulse:  92  82  Temp:  97.7 F (36.5 C)    TempSrc:      Resp:  20    Height:      Weight:      SpO2: 96% 96% 97%     Intake/Output Summary (Last 24 hours) at 09/30/11 0940 Last data filed at 09/30/11 0800  Gross per 24 hour  Intake    772 ml  Output   1125 ml  Net   -353 ml    Weight change:   General: Alert, awake, oriented x3, in no acute distress. HEENT: No bruits, no goiter. Positive JVD Heart: Irregularly irregular. Lungs: Bibasilar crackles. No wheezes Abdomen: Soft, nontender, nondistended, positive bowel sounds. Extremities: No clubbing cyanosis . Trace bilateral lower extremity edema with positive pedal pulses. Neuro: Grossly intact, nonfocal.    Lab Results:  [facility] 09/29/11 2224  NA 142  K 3.7  CL 108  CO2 --  GLUCOSE 138*  BUN 33*  CREATININE 1.60*  CALCIUM --  MG --  PHOS --   No results found for this basename: AST:2,ALT:2,ALKPHOS:2,BILITOT:2,PROT:2,ALBUMIN:2 in the last 72 hours No results found for this basename: LIPASE:2,AMYLASE:2 in the last 72 hours  Basename 09/30/11 0135 09/29/11 2224 09/29/11 2206  WBC 9.0 -- 8.7  NEUTROABS 5.5 -- --  HGB 13.8 13.9 --  HCT 39.6 41.0 --  MCV 87.2 -- 87.7  PLT 164 -- 157    Basename 09/30/11 0135  CKTOTAL 95  CKMB 3.2  CKMBINDEX --  TROPONINI 0.60*   No components found with this basename: POCBNP:3  Basename 09/30/11 0135  DDIMER 0.36   No results found for this basename: HGBA1C:2 in the last 72 hours No results found for this basename: CHOL:2,HDL:2,LDLCALC:2,TRIG:2,CHOLHDL:2,LDLDIRECT:2 in the last 72 hours No results found for this basename: TSH,T4TOTAL,FREET3,T3FREE,THYROIDAB in the last 72 hours No results found for this basename:  VITAMINB12:2,FOLATE:2,FERRITIN:2,TIBC:2,IRON:2,RETICCTPCT:2 in the last 72 hours  Micro Results: No results found for this or any previous visit (from the past 240 hour(s)).  Studies/Results: Dg Chest 2 View  09/29/2011  *RADIOLOGY REPORT*  Clinical Data: Dyspnea.  CHEST - 2 VIEW  Comparison: 11/12/2010 and a CT scan dated 03/16/2011  Findings: There is a small but increased right pleural effusion. There is cardiomegaly.  Interstitial markings are diffusely more accentuated than on the prior study.  This is superimposed on chronic lung disease.  Old well healed fractures of two right posterior ribs.  Evidence of prior CABG.  IMPRESSION: Increased small right pleural effusion.  Interstitial accentuation which may represent mild pulmonary edema superimposed on chronic lung disease.  Original Report Authenticated By: Gwynn Burly, M.D.    Medications:     . amLODipine  5 mg Oral Daily  . famotidine  20 mg Oral QHS  . fluticasone  2 puff Inhalation BID  . furosemide  40 mg Intravenous Q12H  . metoprolol tartrate  12.5 mg Oral QHS  . metoprolol tartrate  25 mg Oral Daily  . olmesartan  20 mg Oral Daily  . rosuvastatin  20 mg Oral q1800  . sodium chloride  3 mL Intravenous Q12H  . terazosin  5 mg Oral QHS  . warfarin  2.5 mg Oral ONCE-1800  . DISCONTD: furosemide  40 mg Oral  BID  . DISCONTD: metoprolol tartrate  12.5-25 mg Oral Daily    Assessment: Principal Problem:  *CHF (congestive heart failure) Active Problems:  HYPERTENSION  CORONARY HEART DISEASE  Atrial fibrillation  CKD (chronic kidney disease)   Plan: #1 acute CHF exacerbation Questionable etiology. Patient has not had any recent change in his diet. Initial set of cardiac enzymes with troponin elevated at 0.60. Clinical improvement. 2-D echo is pending. Continue strict I.'s and O.'s, daily weights. Will change oral Lasix to Lasix 40 mg IV every 12 hours. Continue Norvasc, metoprolol, Benicar, Crestor. We'll consult with  cardiology for further evaluation and management. #2 hypertension Continue Norvasc, metoprolol, Benicar. #3 atrial fibrillation Continue metoprolol and Norvasc for rate control. Coumadin for anticoagulations. #4 chronic kidney disease Stable. #5 prophylaxis  Pepcid for GI, all Coumadin for DVT.   LOS: 1 day   Shardae Kleinman 09/30/2011, 9:40 AM

## 2011-09-30 NOTE — Progress Notes (Addendum)
ANTICOAGULATION CONSULT NOTE - Initial Consult  Pharmacy Consult for Heparin Indication: chest pain/ACS  Allergies  Allergen Reactions  . Cimetidine     REACTION: unknown reaction, pt states per Dr. Jarold Motto  . Ranitidine Hcl     REACTION: unknown reaction, pt states per Dr. Jarold Motto    Patient Measurements: Height: 5\' 11"  (180.3 cm) Weight: 187 lb 6.3 oz (85 kg) IBW/kg (Calculated) : 75.3  Heparin Dosing Weight: 85kg  Vital Signs: Temp: 97.8 F (36.6 C) (02/02 0943) Temp src: Oral (02/02 0943) BP: 136/73 mmHg (02/02 1023) Pulse Rate: 83  (02/02 0943)  Labs:  Basename 09/30/11 0846 09/30/11 0135 09/29/11 2224 09/29/11 2206  HGB -- 13.8 13.9 --  HCT -- 39.6 41.0 39.8  PLT -- 164 -- 157  APTT -- -- -- --  LABPROT -- -- -- 24.2*  INR -- -- -- 2.13*  HEPARINUNFRC -- -- -- --  CREATININE -- -- 1.60* --  CKTOTAL 90 95 -- --  CKMB 3.1 3.2 -- --  TROPONINI 0.61* 0.60* -- --   Estimated Creatinine Clearance: 37.3 ml/min (by C-G formula based on Cr of 1.6).  Medical History: Past Medical History  Diagnosis Date  . HTN (hypertension)   . GERD (gastroesophageal reflux disease)   . Hyperlipidemia   . Hypertensive cardiovascular disease   . CAD (coronary artery disease)   . MI (myocardial infarction) 1989  . Atrial fibrillation 1/09    s/p DCCV to NSR   . Pulmonary granuloma     left  . Ascending aortic aneurysm   . COPD (chronic obstructive pulmonary disease)   . Shortness of breath     Medications:  Scheduled:    . amLODipine  5 mg Oral Daily  . famotidine  20 mg Oral QHS  . fluticasone  2 puff Inhalation BID  . furosemide  40 mg Intravenous Q12H  . metoprolol tartrate  12.5 mg Oral QHS  . metoprolol tartrate  25 mg Oral Daily  . olmesartan  20 mg Oral Daily  . rosuvastatin  20 mg Oral q1800  . sodium chloride  3 mL Intravenous Q12H  . terazosin  5 mg Oral QHS  . DISCONTD: furosemide  40 mg Oral BID  . DISCONTD: metoprolol tartrate  12.5-25 mg Oral  Daily  . DISCONTD: nitroGLYCERIN  1 inch Topical Q8H  . DISCONTD: warfarin  2.5 mg Oral ONCE-1800    Assessment: 85 yoM to start heparin per pharmacy for ACS once INR <2.  PMHx pertinent for CAD with CABG, last cath 2004, HTN, HLD, Afib on chronic coumadin.  Elevated CEs x 2.  Pt states he has not taken Coumadin for 2 days.  INR last night 2.13 -->2.15, no doses given in hospital.    Heparin dosing weight =85kg   Goal of Therapy:  Heparin level 0.3-0.7 units/ml   Plan:  1.  F/u INR in AM.   If INR < 2, will start heparin drip 12 units/kg/hr (1000 units/hr = 10 ml/hr) and order 8 hour heparin level.  No bolus heparin.     Morris Markham E 09/30/2011,12:50 PM

## 2011-09-30 NOTE — Progress Notes (Signed)
*  PRELIMINARY RESULTS* Echocardiogram 2D Echocardiogram has been performed.  Clide Deutscher 09/30/2011, 4:56 PM

## 2011-09-30 NOTE — Significant Event (Signed)
CRITICAL VALUE ALERT  Critical value received:  Troponin 0.60   Date of notification:  09/30/2011  Time of notification:  0239  Critical value read back:yes  Nurse who received alert:  Gerre Pebbles, RN    MD notified (1st page):  Elray Mcgregor, NP  Time of first page:  0240  MD notified (2nd page):  Time of second page:  Responding MD:  Elray Mcgregor, NP  Time MD responded:  [phone]

## 2011-09-30 NOTE — Progress Notes (Signed)
ANTICOAGULATION CONSULT NOTE - Initial Consult  Pharmacy Consult for Coumadin Indication: atrial fibrillation  Allergies  Allergen Reactions  . Cimetidine     REACTION: unknown reaction, pt states per Dr. Jarold Motto  . Ranitidine Hcl     REACTION: unknown reaction, pt states per Dr. Jarold Motto    Patient Measurements: Height: 5\' 11"  (180.3 cm) Weight: 187 lb 6.3 oz (85 kg) IBW/kg (Calculated) : 75.3   Vital Signs: Temp: 97.4 F (36.3 C) (02/02 0126) Temp src: Oral (02/01 2053) BP: 145/78 mmHg (02/02 0126) Pulse Rate: 92  (02/02 0126)  Labs:  Basename 09/29/11 2224 09/29/11 2206  HGB 13.9 14.0  HCT 41.0 39.8  PLT -- 157  APTT -- --  LABPROT -- 24.2*  INR -- 2.13*  HEPARINUNFRC -- --  CREATININE 1.60* --  CKTOTAL -- --  CKMB -- --  TROPONINI -- --   Estimated Creatinine Clearance: 37.3 ml/min (by C-G formula based on Cr of 1.6).  Medical History: Past Medical History  Diagnosis Date  . HTN (hypertension)   . GERD (gastroesophageal reflux disease)   . Hyperlipidemia   . Hypertensive cardiovascular disease   . CAD (coronary artery disease)   . MI (myocardial infarction) 1989  . Atrial fibrillation 1/09    s/p DCCV to NSR   . Pulmonary granuloma     left  . Ascending aortic aneurysm     Medications:  Prescriptions prior to admission  Medication Sig Dispense Refill  . albuterol (PROVENTIL HFA;VENTOLIN HFA) 108 (90 BASE) MCG/ACT inhaler Inhale 2 puffs into the lungs every 4 (four) hours as needed. For shortness of breath      . amLODipine (NORVASC) 5 MG tablet Take 5 mg by mouth daily.        . budesonide (PULMICORT) 180 MCG/ACT inhaler Inhale 2 puffs into the lungs 2 (two) times daily.      . Cholecalciferol (VITAMIN D3) 1000 UNITS CAPS Take 2 capsules by mouth daily.       . famotidine (PEPCID) 20 MG tablet Take 20 mg by mouth at bedtime. One at bedtime      . furosemide (LASIX) 40 MG tablet Take 40 mg by mouth daily.        Marland Kitchen LORazepam (ATIVAN) 0.5 MG  tablet Take 0.5 mg by mouth every 6 (six) hours as needed. For anxiety      . metoprolol tartrate (LOPRESSOR) 25 MG tablet Take 12.5-25 mg by mouth daily. Take 25 mg in morning and 12.5 mg at night      . simvastatin (ZOCOR) 80 MG tablet Take 80 mg by mouth at bedtime.        Marland Kitchen terazosin (HYTRIN) 5 MG capsule Take 5 mg by mouth at bedtime.        Marland Kitchen warfarin (COUMADIN) 5 MG tablet Take 2.5 mg by mouth daily.         Assessment: 78 y.o. Male presents with SOB. Pt on coumadin 2.5mg  daily PTA but states he hasn't taken in 2 days. Baseline INR therapeutic  Goal of Therapy:  INR 2-3   Plan:  1. Coumadin 2.5mg  po today 2. Daily INR  [place] Throckmorton, Hilario Quarry, PharmD, BCPS Clinical pharmacist, pager [phone] 09/30/2011,1:45 AM

## 2011-10-01 DIAGNOSIS — I200 Unstable angina: Secondary | ICD-10-CM | POA: Diagnosis present

## 2011-10-01 DIAGNOSIS — E876 Hypokalemia: Secondary | ICD-10-CM | POA: Insufficient documentation

## 2011-10-01 HISTORY — PX: CARDIAC CATHETERIZATION: SHX172

## 2011-10-01 LAB — BASIC METABOLIC PANEL
BUN: 32 mg/dL — ABNORMAL HIGH (ref 6–23)
Chloride: 102 mEq/L (ref 96–112)
GFR calc Af Amer: 45 mL/min — ABNORMAL LOW (ref >90)
Potassium: 3.3 mEq/L — ABNORMAL LOW (ref 3.5–5.1)
Sodium: 141 mEq/L (ref 135–145)

## 2011-10-01 LAB — PROTIME-INR
INR: 2 — ABNORMAL HIGH (ref 0.00–1.49)
Prothrombin Time: 23 seconds — ABNORMAL HIGH (ref 11.6–15.2)

## 2011-10-01 LAB — HEPARIN LEVEL (UNFRACTIONATED): Heparin Unfractionated: <0.1 IU/mL — ABNORMAL LOW (ref 0.30–0.70)

## 2011-10-01 MED ORDER — SODIUM CHLORIDE 0.9 % IV SOLN: 250.0000 mL | INTRAVENOUS | Status: DC | PRN

## 2011-10-01 MED ORDER — DIAZEPAM 5 MG PO TABS
5.0000 mg | ORAL_TABLET | ORAL | Status: AC
  Administered 2011-10-02: 5 mg via ORAL
  Filled 2011-10-01: qty 1

## 2011-10-01 MED ORDER — HEPARIN SOD (PORCINE) IN D5W 100 UNIT/ML IV SOLN
1550.0000 [IU]/h | INTRAVENOUS | Status: DC
  Administered 2011-10-01 – 2011-10-02 (×3): 1300 [IU]/h via INTRAVENOUS
  Administered 2011-10-03 – 2011-10-04 (×2): 1400 [IU]/h via INTRAVENOUS
  Administered 2011-10-05: 1650 [IU]/h via INTRAVENOUS
  Administered 2011-10-06 (×2): 1550 [IU]/h via INTRAVENOUS
  Administered 2011-10-06: 1650 [IU]/h via INTRAVENOUS
  Filled 2011-10-01 (×8): qty 250

## 2011-10-01 MED ORDER — ASPIRIN 81 MG PO CHEW
324.0000 mg | CHEWABLE_TABLET | ORAL | Status: AC
  Administered 2011-10-02: 324 mg via ORAL
  Filled 2011-10-01: qty 4

## 2011-10-01 MED ORDER — SODIUM CHLORIDE 0.9 % IJ SOLN: 3.0000 mL | INTRAMUSCULAR | Status: DC | PRN

## 2011-10-01 MED ORDER — HEPARIN SOD (PORCINE) IN D5W 100 UNIT/ML IV SOLN
1000.0000 [IU]/h | INTRAVENOUS | Status: DC
  Administered 2011-10-01: 1000 [IU]/h via INTRAVENOUS
  Filled 2011-10-01: qty 250

## 2011-10-01 MED ORDER — POTASSIUM CHLORIDE CRYS ER 20 MEQ PO TBCR
40.0000 meq | EXTENDED_RELEASE_TABLET | ORAL | Status: AC
  Administered 2011-10-01 (×2): 40 meq via ORAL
  Filled 2011-10-01 (×2): qty 2

## 2011-10-01 MED ORDER — SODIUM CHLORIDE 0.9 % IJ SOLN
3.0000 mL | Freq: Two times a day (BID) | INTRAMUSCULAR | Status: DC
  Administered 2011-10-01: 3 mL via INTRAVENOUS

## 2011-10-01 MED ORDER — SODIUM CHLORIDE 0.9 % IV SOLN
INTRAVENOUS | Status: DC
  Administered 2011-10-02: 06:00:00 via INTRAVENOUS

## 2011-10-01 NOTE — Progress Notes (Signed)
[facility]  DAILY PROGRESS NOTE   Subjective:  Better, no longer frankly orthopneic, slept the night w HOB at about 30 degrees. Angina free  Echo shows drastic worsening of LVEF, now around 25%. Old anterior wall scar but also extensive lateral wall abnormalities. Mitral flow still c/w high filling pressures (echo at 4 PM yesterday). 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  Objective:  Temp:  [97.5 F (36.4 C)-98.2 F (36.8 C)] 97.8 F (36.6 C) (02/03 0551) Pulse Rate:  [77-87] 77  (02/03 1101) Resp:  [20] 20  (02/03 0551) BP: (115-127)/(63-69) 127/66 mmHg (02/03 1058) SpO2:  [95 %-97 %] 95 % (02/03 0739) Weight:  [84 kg (185 lb 3 oz)] 84 kg (185 lb 3 oz) (02/03 0551) Weight change: -1 kg (-2 lb 3.3 oz)  Intake/Output from previous day: 02/02 0701 - 02/03 0700 In: 1325 [P.O.:1250; I.V.:67; IV Piggyback:8] Out: 2350 [Urine:2350]  Intake/Output from this shift: Total I/O In: 330 [P.O.:330] Out: 375 [Urine:375]  Medications: Current Facility-Administered Medications  Medication Dose Route Frequency Provider Last Rate Last Dose  . 0.9 %  sodium chloride infusion  250 mL Intravenous PRN Therisa Doyne, MD 10 mL/hr at 09/30/11 1151 250 mL at 09/30/11 1151  . acetaminophen (TYLENOL) tablet 650 mg  650 mg Oral Q4H PRN Therisa Doyne, MD      . albuterol (PROVENTIL HFA;VENTOLIN HFA) 108 (90 BASE) MCG/ACT inhaler 2 puff  2 puff Inhalation Q4H PRN Therisa Doyne, MD      . amLODipine (NORVASC) tablet 5 mg  5 mg Oral Daily Therisa Doyne, MD   5 mg at 10/01/11 1058  . famotidine (PEPCID) tablet 20 mg  20 mg Oral QHS Therisa Doyne, MD   20 mg at 09/30/11 2227  . fluticasone (FLOVENT HFA) 44 MCG/ACT inhaler 2 puff  2 puff Inhalation BID Therisa Doyne, MD   2 puff at 10/01/11 0739  . furosemide (LASIX) injection 40 mg  40 mg Intravenous Q12H Ramiro Harvest, MD   40 mg at  10/01/11 0829  . heparin ADULT infusion 100 units/ml (25000 units/250 ml)  1,000 Units/hr Intravenous Continuous Colleen E Summe, PHARMD 10 mL/hr at 10/01/11 1055 1,000 Units/hr at 10/01/11 1055  . LORazepam (ATIVAN) tablet 0.5 mg  0.5 mg Oral Q6H PRN Therisa Doyne, MD      . metoprolol tartrate (LOPRESSOR) tablet 12.5 mg  12.5 mg Oral QHS Hilario Quarry Amend, PHARMD   12.5 mg at 09/30/11 2227  . metoprolol tartrate (LOPRESSOR) tablet 25 mg  25 mg Oral Daily Hilario Quarry Amend, PHARMD   25 mg at 10/01/11 1101  . nitroGLYCERIN 0.2 mg/mL in dextrose 5 % infusion  10 mcg/min Intravenous Titrated Ceilidh Torregrossa, MD 3 mL/hr at 09/30/11 1251 10 mcg/min at 09/30/11 1251  . olmesartan (BENICAR) tablet 20 mg  20 mg Oral Daily Therisa Doyne, MD   20 mg at 10/01/11 1101  . ondansetron (ZOFRAN) injection 4 mg  4 mg Intravenous Q6H PRN Therisa Doyne, MD      . potassium chloride SA (K-DUR,KLOR-CON) CR tablet 40 mEq  40 mEq Oral Q4H Ramiro Harvest, MD      . rosuvastatin (CRESTOR) tablet 20 mg  20 mg Oral q1800 Therisa Doyne, MD   20 mg at 09/30/11 1713  . sodium chloride 0.9 % injection 3 mL  3 mL Intravenous Q12H Therisa Doyne, MD   3 mL at 09/30/11 2228  . sodium chloride 0.9 % injection 3  mL  3 mL Intravenous PRN Therisa Doyne, MD      . terazosin (HYTRIN) capsule 5 mg  5 mg Oral QHS Therisa Doyne, MD   5 mg at 09/30/11 2226    Physical Exam: General:A&O Pleasant affect.  Skin:W&D brrisk capillary refill  HEENT:normocephalic, sclera clear  Neck: JVP up 4 cm, prompt HJR; no carotid or subclavian bruits  Heart: irregular, normal S1 and S2, barely audible apical systolic murmur , no diastolic murmur Lungs:clear, diminished breath sounds throughout  Abd:non tender, not distended, no abnormal pulsatility or bruits  Ext: no edema, thready pedal pulses, 3+ femoral and radial pulses, 2+ femoral bruits  Neuro:A&O X 3 MAE, follows commands    Lab Results: Results for  orders placed during the hospital encounter of 09/29/11 (from the past 48 hour(s))  CBC     Status: Normal   Collection Time   09/29/11 10:06 PM      Component Value Range Comment   WBC 8.7  4.0 - 10.5 (K/uL)    RBC 4.54  4.22 - 5.81 (MIL/uL)    Hemoglobin 14.0  13.0 - 17.0 (g/dL)    HCT 16.1  09.6 - 04.5 (%)    MCV 87.7  78.0 - 100.0 (fL)    MCH 30.8  26.0 - 34.0 (pg)    MCHC 35.2  30.0 - 36.0 (g/dL)    RDW 40.9  81.1 - 91.4 (%)    Platelets 157  150 - 400 (K/uL)   PRO B NATRIURETIC PEPTIDE     Status: Abnormal   Collection Time   09/29/11 10:06 PM      Component Value Range Comment   Pro B Natriuretic peptide (BNP) 8388.0 (*) 0 - 450 (pg/mL)   PROTIME-INR     Status: Abnormal   Collection Time   09/29/11 10:06 PM      Component Value Range Comment   Prothrombin Time 24.2 (*) 11.6 - 15.2 (seconds)    INR 2.13 (*) 0.00 - 1.49    POCT I-STAT, CHEM 8     Status: Abnormal   Collection Time   09/29/11 10:24 PM      Component Value Range Comment   Sodium 142  135 - 145 (mEq/L)    Potassium 3.7  3.5 - 5.1 (mEq/L)    Chloride 108  96 - 112 (mEq/L)    BUN 33 (*) 6 - 23 (mg/dL)    Creatinine, Ser 7.82 (*) 0.50 - 1.35 (mg/dL)    Glucose, Bld 956 (*) 70 - 99 (mg/dL)    Calcium, Ion 2.13  1.12 - 1.32 (mmol/L)    TCO2 23  0 - 100 (mmol/L)    Hemoglobin 13.9  13.0 - 17.0 (g/dL)    HCT 08.6  57.8 - 46.9 (%)   CBC     Status: Normal   Collection Time   09/30/11  1:35 AM      Component Value Range Comment   WBC 9.0  4.0 - 10.5 (K/uL)    RBC 4.54  4.22 - 5.81 (MIL/uL)    Hemoglobin 13.8  13.0 - 17.0 (g/dL)    HCT 62.9  52.8 - 41.3 (%)    MCV 87.2  78.0 - 100.0 (fL)    MCH 30.4  26.0 - 34.0 (pg)    MCHC 34.8  30.0 - 36.0 (g/dL)    RDW 24.4  01.0 - 27.2 (%)    Platelets 164  150 - 400 (K/uL)   DIFFERENTIAL  Status: Normal   Collection Time   09/30/11  1:35 AM      Component Value Range Comment   Neutrophils Relative 61  43 - 77 (%)    Neutro Abs 5.5  1.7 - 7.7 (K/uL)    Lymphocytes  Relative 26  12 - 46 (%)    Lymphs Abs 2.3  0.7 - 4.0 (K/uL)    Monocytes Relative 11  3 - 12 (%)    Monocytes Absolute 0.9  0.1 - 1.0 (K/uL)    Eosinophils Relative 3  0 - 5 (%)    Eosinophils Absolute 0.2  0.0 - 0.7 (K/uL)    Basophils Relative 0  0 - 1 (%)    Basophils Absolute 0.0  0.0 - 0.1 (K/uL)   CARDIAC PANEL(CRET KIN+CKTOT+MB+TROPI)     Status: Abnormal   Collection Time   09/30/11  1:35 AM      Component Value Range Comment   Total CK 95  7 - 232 (U/L)    CK, MB 3.2  0.3 - 4.0 (ng/mL)    Troponin I 0.60 (*) <0.30 (ng/mL)    Relative Index RELATIVE INDEX IS INVALID  0.0 - 2.5    D-DIMER, QUANTITATIVE     Status: Normal   Collection Time   09/30/11  1:35 AM      Component Value Range Comment   D-Dimer, Quant 0.36  0.00 - 0.48 (ug/mL-FEU)   TSH     Status: Abnormal   Collection Time   09/30/11  5:00 AM      Component Value Range Comment   TSH 5.046 (*) 0.350 - 4.500 (uIU/mL)   CARDIAC PANEL(CRET KIN+CKTOT+MB+TROPI)     Status: Abnormal   Collection Time   09/30/11  8:46 AM      Component Value Range Comment   Total CK 90  7 - 232 (U/L)    CK, MB 3.1  0.3 - 4.0 (ng/mL)    Troponin I 0.61 (*) <0.30 (ng/mL)    Relative Index RELATIVE INDEX IS INVALID  0.0 - 2.5    PROTIME-INR     Status: Abnormal   Collection Time   09/30/11  2:02 PM      Component Value Range Comment   Prothrombin Time 24.4 (*) 11.6 - 15.2 (seconds)    INR 2.15 (*) 0.00 - 1.49    CARDIAC PANEL(CRET KIN+CKTOT+MB+TROPI)     Status: Abnormal   Collection Time   09/30/11  2:04 PM      Component Value Range Comment   Total CK 81  7 - 232 (U/L)    CK, MB 2.9  0.3 - 4.0 (ng/mL)    Troponin I 0.50 (*) <0.30 (ng/mL)    Relative Index RELATIVE INDEX IS INVALID  0.0 - 2.5    BASIC METABOLIC PANEL     Status: Abnormal   Collection Time   10/01/11  7:10 AM      Component Value Range Comment   Sodium 141  135 - 145 (mEq/L)    Potassium 3.3 (*) 3.5 - 5.1 (mEq/L)    Chloride 102  96 - 112 (mEq/L)    CO2 23  19 - 32  (mEq/L)    Glucose, Bld 115 (*) 70 - 99 (mg/dL)    BUN 32 (*) 6 - 23 (mg/dL)    Creatinine, Ser 4.03 (*) 0.50 - 1.35 (mg/dL)    Calcium 9.0  8.4 - 10.5 (mg/dL)    GFR calc non Af Amer 39 (*) >90 (  mL/min)    GFR calc Af Amer 45 (*) >90 (mL/min)   PROTIME-INR     Status: Abnormal   Collection Time   10/01/11  7:10 AM      Component Value Range Comment   Prothrombin Time 23.0 (*) 11.6 - 15.2 (seconds)    INR 2.00 (*) 0.00 - 1.49      Imaging: Dg Chest 2 View  09/29/2011  *RADIOLOGY REPORT*  Clinical Data: Dyspnea.  CHEST - 2 VIEW  Comparison: 11/12/2010 and a CT scan dated 03/16/2011  Findings: There is a small but increased right pleural effusion. There is cardiomegaly.  Interstitial markings are diffusely more accentuated than on the prior study.  This is superimposed on chronic lung disease.  Old well healed fractures of two right posterior ribs.  Evidence of prior CABG.  IMPRESSION: Increased small right pleural effusion.  Interstitial accentuation which may represent mild pulmonary edema superimposed on chronic lung disease.  Original Report Authenticated By: Gwynn Burly, M.D.   ECHO: Left ventricle: The cavity size was normal. There was mild concentric hypertrophy. Systolic function was severely reduced. The estimated ejection fraction was in the range of 20% to 25%. Akinesis and scarring of the mid-distalanteroseptal myocardium; consistent with infarction in the distribution of the left anterior descending coronary artery. Severe hypokinesis of the entireanterior, anterolateral, lateral, and apical myocardium; consistent with ischemia or infarction in the distribution of the left anterior descending and left circumflex coronary artery. Features are consistent with a pseudonormal left ventricular filling pattern, with concomitant abnormal relaxation and increased filling pressure (grade 2 diastolic dysfunction). Doppler parameters are consistent with elevated mean left  atrial filling pressure. - Aortic valve: Mild to moderate regurgitation directed centrally in the LVOT. - Ascending aorta: The ascending aorta was severely dilated. - Mitral valve: Calcified annulus. Mild regurgitation directed centrally. - Left atrium: The atrium was moderately to severely dilated.   Assessment:  1. Principal Problem: 2.  *CHF (congestive heart failure), EF 40% with myoview 09/2009 3. Active Problems: 4.  HYPERTENSION 5.  CORONARY HEART DISEASE, CABG 1999, Last cath 01/2003 with patent grafts, LIMA to LAD, seq. SVG to diagonal and OD, seq. SVG to 2 marginals, and triple sequential SVG to 2 PDA branches and PLA.   6.  Atrial fibrillation, chronic- permanent 7.  CKD (chronic kidney disease) 8.  Chronic anticoagulation, secondary to Chronic a. fib. 9.  Unstable angina 10.  Hypokalemia 11. Asc Ao aneurysm 55 x 56 mm by CTA, 52 cm by echo 12. Mild to moderate AI  Plan:  1. Findings suggest he has lost the sequential SVG to diagonal and ramus, possibly also segments of the sequential SVG to inferolateral wall.   Recommend coronary angio as long as INR and creatinine levels permit. This evening hold diuretics and hydrate overnight in anticipation of LHC. If there is an option for PCI, he would prefer this. Probably staged procedure due to renal insufficiency.   Dr. Tyrone Sage has been seeing him for AscAo Aneurysm and has not entirely ruled out future repair, although there is a lot of reluctance on both the surgeon's and patient's part due to the high risk nature of such a procedure. High risk Asc Ao replacement and redo CABG may have to be considered depending on coronary anatomy.  Lengthy discussion with patient and 4 family members regarding the plan of acre and the risks and benefits of diagnostic cath and possible PCI.  Time Spent Directly with Patient:  45 minutes  Length of  Stay:  LOS: 2 days    Shell Blanchette 10/01/2011, 11:44 AM

## 2011-10-01 NOTE — Progress Notes (Signed)
ANTICOAGULATION CONSULT NOTE - Follow Up Consult  Pharmacy Consult for Heparin Indication: chest pain/ACS  Allergies  Allergen Reactions  . Cimetidine     REACTION: unknown reaction, pt states per Dr. Jarold Motto  . Ranitidine Hcl     REACTION: unknown reaction, pt states per Dr. Jarold Motto    Patient Measurements: Height: 5\' 11"  (180.3 cm) Weight: 185 lb 3 oz (84 kg) IBW/kg (Calculated) : 75.3  Heparin Dosing Weight: 85kg  Vital Signs: Temp: 97.8 F (36.6 C) (02/03 0551) Temp src: Oral (02/03 0551) BP: 127/69 mmHg (02/03 0551) Pulse Rate: 87  (02/02 2200)  Labs:  Basename 10/01/11 0710 09/30/11 1404 09/30/11 1402 09/30/11 0846 09/30/11 0135 09/29/11 2224 09/29/11 2206  HGB -- -- -- -- 13.8 13.9 --  HCT -- -- -- -- 39.6 41.0 39.8  PLT -- -- -- -- 164 -- 157  APTT -- -- -- -- -- -- --  LABPROT 23.0* -- 24.4* -- -- -- 24.2*  INR 2.00* -- 2.15* -- -- -- 2.13*  HEPARINUNFRC -- -- -- -- -- -- --  CREATININE 1.57* -- -- -- -- 1.60* --  CKTOTAL -- 81 -- 90 95 -- --  CKMB -- 2.9 -- 3.1 3.2 -- --  TROPONINI -- 0.50* -- 0.61* 0.60* -- --   Estimated Creatinine Clearance: 38 ml/min (by C-G formula based on Cr of 1.57).  Assessment: 42 yoM admitted for SOB.  Pt on chronic coumadin for Afib, no dose given last night due to plan for starting heparin once INR < 2.0.  Today INR decreased 2.15-->2.00.  Will start heparin infusion for ACS and stop Coumadin in anticipation of possible cath.    Goal of Therapy:  Heparin level 0.3-0.7 units/ml   Plan:  1.  No bolus.  Start heparin infusion 12 units/kr/hr (1000 units/hr = 10 ml/hr).   2.  F/u 8 hour HL and daily HL, CBC, PT/INR.     Ligaya Cormier E 10/01/2011,9:33 AM

## 2011-10-01 NOTE — Progress Notes (Signed)
Subjective: Patient states shortness of breath improving. Patient denies any chest pain. Patient states was able to sleep some last night.  Objective: Vital signs in last 24 hours: Filed Vitals:   09/30/11 1821 09/30/11 2200 10/01/11 0551 10/01/11 0739  BP:  121/63 127/69   Pulse:  87    Temp:  98.2 F (36.8 C) 97.8 F (36.6 C)   TempSrc:  Oral Oral   Resp:  20 20   Height:      Weight:   84 kg (185 lb 3 oz)   SpO2: 96% 96% 97% 95%    Intake/Output Summary (Last 24 hours) at 10/01/11 1040 Last data filed at 10/01/11 0955  Gross per 24 hour  Intake 1051.95 ml  Output   2525 ml  Net -1473.05 ml    Weight change: -1 kg (-2 lb 3.3 oz)  General: Alert, awake, oriented x3, in no acute distress. HEENT: No bruits, no goiter. Positive JVD Heart: Irregularly irregular. Lungs: bibasilar crackles. Abdomen: Soft, nontender, nondistended, positive bowel sounds. Extremities: No clubbing cyanosis . Trace bilateral lower extremity edema with positive pedal pulses.    Lab Results:  Basename 10/01/11 0710 09/29/11 2224  NA 141 142  K 3.3* 3.7  CL 102 108  CO2 23 --  GLUCOSE 115* 138*  BUN 32* 33*  CREATININE 1.57* 1.60*  CALCIUM 9.0 --  MG -- --  PHOS -- --   No results found for this basename: AST:2,ALT:2,ALKPHOS:2,BILITOT:2,PROT:2,ALBUMIN:2 in the last 72 hours No results found for this basename: LIPASE:2,AMYLASE:2 in the last 72 hours  Basename 09/30/11 0135 09/29/11 2224 09/29/11 2206  WBC 9.0 -- 8.7  NEUTROABS 5.5 -- --  HGB 13.8 13.9 --  HCT 39.6 41.0 --  MCV 87.2 -- 87.7  PLT 164 -- 157    Basename 09/30/11 1404 09/30/11 0846 09/30/11 0135  CKTOTAL 81 90 95  CKMB 2.9 3.1 3.2  CKMBINDEX -- -- --  TROPONINI 0.50* 0.61* 0.60*   No components found with this basename: POCBNP:3  Basename 09/30/11 0135  DDIMER 0.36   No results found for this basename: HGBA1C:2 in the last 72 hours No results found for this basename:  CHOL:2,HDL:2,LDLCALC:2,TRIG:2,CHOLHDL:2,LDLDIRECT:2 in the last 72 hours  Basename 09/30/11 0500  TSH 5.046*  T4TOTAL --  T3FREE --  THYROIDAB --   No results found for this basename: VITAMINB12:2,FOLATE:2,FERRITIN:2,TIBC:2,IRON:2,RETICCTPCT:2 in the last 72 hours  Micro Results: No results found for this or any previous visit (from the past 240 hour(s)).  Studies/Results: Dg Chest 2 View  09/29/2011  *RADIOLOGY REPORT*  Clinical Data: Dyspnea.  CHEST - 2 VIEW  Comparison: 11/12/2010 and a CT scan dated 03/16/2011  Findings: There is a small but increased right pleural effusion. There is cardiomegaly.  Interstitial markings are diffusely more accentuated than on the prior study.  This is superimposed on chronic lung disease.  Old well healed fractures of two right posterior ribs.  Evidence of prior CABG.  IMPRESSION: Increased small right pleural effusion.  Interstitial accentuation which may represent mild pulmonary edema superimposed on chronic lung disease.  Original Report Authenticated By: Gwynn Burly, M.D.    Medications:     . amLODipine  5 mg Oral Daily  . famotidine  20 mg Oral QHS  . fluticasone  2 puff Inhalation BID  . furosemide  40 mg Intravenous Q12H  . metoprolol tartrate  12.5 mg Oral QHS  . metoprolol tartrate  25 mg Oral Daily  . olmesartan  20 mg Oral Daily  .  potassium chloride  40 mEq Oral Q4H  . rosuvastatin  20 mg Oral q1800  . sodium chloride  3 mL Intravenous Q12H  . terazosin  5 mg Oral QHS  . DISCONTD: nitroGLYCERIN  1 inch Topical Q8H  . DISCONTD: warfarin  2.5 mg Oral ONCE-1800    Assessment: Principal Problem:  *CHF (congestive heart failure), EF 40% with myoview 09/2009 Active Problems:  Unstable angina  HYPERTENSION  CORONARY HEART DISEASE, CABG 1999, Last cath 01/2003 with patent grafts, LIMA to LAD, seq. SVG to diagonal and OD, seq. SVG to 2 marginals, and triple sequential SVG to 2 PDA branches and PLA.    Atrial fibrillation, chronic-  permanent  CKD (chronic kidney disease)  Chronic anticoagulation, secondary to Chronic a. fib.  Hypokalemia   Plan: #1 acute on chronic CHF exacerbation Likely secondary to [place]. Per cardiology probable new coronary insufficiency secondary to possible graft closure .Patient has not had any recent change in his diet. Cardiac enzymes mild elevation in troponin. Clinical improvement. I/O = -1823.1. 2-D echo is pending. Continue strict I.'s and O.'s, daily weights.Continue Lasix 40 mg IV every 12 hours. Continue Norvasc, metoprolol, Benicar, Crestor, heparin. Per cardiology if EF low then proceed to cardiac cath. #2 hypertension Continue Norvasc, metoprolol, Benicar. #3.hypokalemia Secondary to diureses. Replete. #4 atrial fibrillation Continue metoprolol and Norvasc for rate control. Coumadin on hold. On heparin for possible cath. #5 chronic kidney disease Stable. #6 prophylaxis  Pepcid for GI, all Coumadin for DVT.   LOS: 2 days   [name] [name],[name] [name] 10/01/2011, 10:40 AM

## 2011-10-01 NOTE — Progress Notes (Signed)
ANTICOAGULATION CONSULT NOTE - Follow Up Consult  Pharmacy Consult for Heparin Indication: chest pain/ACS  Allergies  Allergen Reactions  . Cimetidine     REACTION: unknown reaction, pt states per Dr. Jarold Motto  . Ranitidine Hcl     REACTION: unknown reaction, pt states per Dr. Jarold Motto    Patient Measurements: Height: 5\' 11"  (180.3 cm) Weight: 185 lb 3 oz (84 kg) IBW/kg (Calculated) : 75.3  Heparin Dosing Weight: 84kg  Vital Signs: Temp: 97.7 F (36.5 C) (02/03 1413) Temp src: Oral (02/03 1413) BP: 110/65 mmHg (02/03 1413) Pulse Rate: 73  (02/03 1413)  Labs:  Basename 10/01/11 1830 10/01/11 0710 09/30/11 1404 09/30/11 1402 09/30/11 0846 09/30/11 0135 09/29/11 2224 09/29/11 2206  HGB -- -- -- -- -- 13.8 13.9 --  HCT -- -- -- -- -- 39.6 41.0 39.8  PLT -- -- -- -- -- 164 -- 157  APTT -- -- -- -- -- -- -- --  LABPROT -- 23.0* -- 24.4* -- -- -- 24.2*  INR -- 2.00* -- 2.15* -- -- -- 2.13*  HEPARINUNFRC <0.10* -- -- -- -- -- -- --  CREATININE -- 1.57* -- -- -- -- 1.60* --  CKTOTAL -- -- 81 -- 90 95 -- --  CKMB -- -- 2.9 -- 3.1 3.2 -- --  TROPONINI -- -- 0.50* -- 0.61* 0.60* -- --   Estimated Creatinine Clearance: 38 ml/min (by C-G formula based on Cr of 1.57).   Medications:  Heparin 1000 units/hr   Assessment: 85yom on heparin for CP/USA. Pt is on chronic Coumadin for Afib but Coumadin is on hold for possible cath tomorrow. INR this AM was 2. Heparin level (<0.1) is subtherapeutic.  - H/H and Plts wnl - No significant bleeding reported - RN reports no problems with IV/infusion  Goal of Therapy:  Heparin level 0.3-0.7 units/ml   Plan:  1. Increase heparin drip to 1300 units/hr (13 ml/hr) 2. Check heparin level 8hrs after rate increase  Cleon Dew [phone] 10/01/2011,7:55 PM

## 2011-10-02 ENCOUNTER — Encounter (HOSPITAL_COMMUNITY): Payer: Self-pay | Admitting: Cardiology

## 2011-10-02 ENCOUNTER — Encounter (HOSPITAL_COMMUNITY)
Admission: EM | Disposition: A | Discharge status: Home Health | Payer: Self-pay | Source: Home / Self Care | Attending: Cardiovascular Disease

## 2011-10-02 DIAGNOSIS — I255 Ischemic cardiomyopathy: Secondary | ICD-10-CM

## 2011-10-02 HISTORY — DX: Ischemic cardiomyopathy: I25.5

## 2011-10-02 HISTORY — PX: LEFT AND RIGHT HEART CATHETERIZATION WITH CORONARY/GRAFT ANGIOGRAM: SHX5448

## 2011-10-02 LAB — CBC
HCT: 41 % (ref 39.0–52.0)
Hemoglobin: 13.8 g/dL (ref 13.0–17.0)
MCV: 88.7 fL (ref 78.0–100.0)
RBC: 4.62 MIL/uL (ref 4.22–5.81)
WBC: 7.8 10*3/uL (ref 4.0–10.5)

## 2011-10-02 LAB — POCT I-STAT 3, ART BLOOD GAS (G3+)
Bicarbonate: 24 mEq/L (ref 20.0–24.0)
pCO2 arterial: 35.4 mmHg (ref 35.0–45.0)
pO2, Arterial: 77 mmHg — ABNORMAL LOW (ref 80.0–100.0)

## 2011-10-02 LAB — BASIC METABOLIC PANEL
CO2: 25 mEq/L (ref 19–32)
Chloride: 102 mEq/L (ref 96–112)
Creatinine, Ser: 1.7 mg/dL — ABNORMAL HIGH (ref 0.50–1.35)
GFR calc Af Amer: 41 mL/min — ABNORMAL LOW (ref >90)
Potassium: 3.7 mEq/L (ref 3.5–5.1)
Sodium: 139 mEq/L (ref 135–145)

## 2011-10-02 LAB — POCT I-STAT 3, VENOUS BLOOD GAS (G3P V)
TCO2: 28 mmol/L (ref 0–100)
pCO2, Ven: 43.5 mmHg — ABNORMAL LOW (ref 45.0–50.0)
pH, Ven: 7.394 — ABNORMAL HIGH (ref 7.250–7.300)

## 2011-10-02 LAB — PROTIME-INR
INR: 1.52 — ABNORMAL HIGH (ref 0.00–1.49)
Prothrombin Time: 18.6 seconds — ABNORMAL HIGH (ref 11.6–15.2)

## 2011-10-02 SURGERY — LEFT AND RIGHT HEART CATHETERIZATION WITH CORONARY/GRAFT ANGIOGRAM
Anesthesia: LOCAL

## 2011-10-02 MED ORDER — WARFARIN SODIUM 2.5 MG PO TABS
2.5000 mg | ORAL_TABLET | Freq: Once | ORAL | Status: AC
  Administered 2011-10-02: 2.5 mg via ORAL
  Filled 2011-10-02: qty 1

## 2011-10-02 MED ORDER — FENTANYL CITRATE 0.05 MG/ML IJ SOLN
INTRAMUSCULAR | Status: AC
  Filled 2011-10-02: qty 2

## 2011-10-02 MED ORDER — LIDOCAINE HCL (PF) 1 % IJ SOLN
INTRAMUSCULAR | Status: AC
  Filled 2011-10-02: qty 30

## 2011-10-02 MED ORDER — SODIUM CHLORIDE 0.9 % IV SOLN
INTRAVENOUS | Status: DC
  Administered 2011-10-03 (×3): via INTRAVENOUS
  Administered 2011-10-07: 10 mL via INTRAVENOUS

## 2011-10-02 MED ORDER — HEPARIN SOD (PORCINE) IN D5W 100 UNIT/ML IV SOLN
1300.0000 [IU]/h | INTRAVENOUS | Status: DC
  Filled 2011-10-02: qty 250

## 2011-10-02 MED ORDER — NITROGLYCERIN 0.2 MG/ML ON CALL CATH LAB
INTRAVENOUS | Status: AC
  Filled 2011-10-02: qty 1

## 2011-10-02 MED ORDER — MIDAZOLAM HCL 2 MG/2ML IJ SOLN
INTRAMUSCULAR | Status: AC
  Filled 2011-10-02: qty 2

## 2011-10-02 MED ORDER — POTASSIUM CHLORIDE CRYS ER 20 MEQ PO TBCR
30.0000 meq | EXTENDED_RELEASE_TABLET | Freq: Once | ORAL | Status: AC
  Administered 2011-10-02: 30 meq via ORAL
  Filled 2011-10-02: qty 1

## 2011-10-02 MED ORDER — SODIUM CHLORIDE 0.9 % IV SOLN: 250.0000 mL | INTRAVENOUS | Status: DC | PRN

## 2011-10-02 MED ORDER — ACETAMINOPHEN 325 MG PO TABS
650.0000 mg | ORAL_TABLET | ORAL | Status: DC | PRN
Start: 2011-10-02 — End: 2011-10-02

## 2011-10-02 MED ORDER — HEPARIN (PORCINE) IN NACL 2-0.9 UNIT/ML-% IJ SOLN
INTRAMUSCULAR | Status: AC
  Filled 2011-10-02: qty 2000

## 2011-10-02 MED ORDER — ONDANSETRON HCL 4 MG/2ML IJ SOLN: 4.0000 mg | Freq: Four times a day (QID) | INTRAMUSCULAR | Status: DC | PRN

## 2011-10-02 NOTE — Telephone Encounter (Signed)
Noted  

## 2011-10-02 NOTE — Telephone Encounter (Signed)
I called the pt and he went to the ER on Friday after speaking to [place] and was admitted with CHF per the pt. He is currently in room # [phone] and he is not sure when he will be discharged. Carron Curie, CMA

## 2011-10-02 NOTE — Progress Notes (Signed)
10/02/11 1600 UR Completed. Tera Mater, RN, BSN

## 2011-10-02 NOTE — Progress Notes (Signed)
ANTICOAGULATION CONSULT NOTE - Follow Up Consult  Pharmacy Consult for UFH Indication: NSTEMI  Allergies  Allergen Reactions  . Cimetidine     REACTION: unknown reaction, pt states per Dr. Jarold Motto  . Ranitidine Hcl     REACTION: unknown reaction, pt states per Dr. Jarold Motto    Patient Measurements: Height: 5\' 11"  (180.3 cm) Weight: 185 lb 10 oz (84.2 kg) IBW/kg (Calculated) : 75.3   Vital Signs: Temp: 97.5 F (36.4 C) (02/04 0558) Temp src: Oral (02/04 0558) BP: 110/61 mmHg (02/04 0558) Pulse Rate: 76  (02/04 0558)  Labs:  Basename 10/02/11 0730 10/01/11 1830 10/01/11 0710 09/30/11 1404 09/30/11 1402 09/30/11 0846 09/30/11 0135 09/29/11 2224 09/29/11 2206  HGB 13.8 -- -- -- -- -- 13.8 -- --  HCT 41.0 -- -- -- -- -- 39.6 41.0 --  PLT 159 -- -- -- -- -- 164 -- 157  APTT -- -- -- -- -- -- -- -- --  LABPROT -- -- 23.0* -- 24.4* -- -- -- 24.2*  INR -- -- 2.00* -- 2.15* -- -- -- 2.13*  HEPARINUNFRC 0.48 <0.10* -- -- -- -- -- -- --  CREATININE 1.70* -- 1.57* -- -- -- -- 1.60* --  CKTOTAL -- -- -- 81 -- 90 95 -- --  CKMB -- -- -- 2.9 -- 3.1 3.2 -- --  TROPONINI -- -- -- 0.50* -- 0.61* 0.60* -- --   Estimated Creatinine Clearance: 35.1 ml/min (by C-G formula based on Cr of 1.7).   Medications:  Scheduled:    . amLODipine  5 mg Oral Daily  . aspirin  324 mg Oral Pre-Cath  . diazepam  5 mg Oral On Call  . famotidine  20 mg Oral QHS  . fluticasone  2 puff Inhalation BID  . furosemide  40 mg Intravenous Q12H  . metoprolol tartrate  12.5 mg Oral QHS  . metoprolol tartrate  25 mg Oral Daily  . olmesartan  20 mg Oral Daily  . potassium chloride  30 mEq Oral Once  . potassium chloride  40 mEq Oral Q4H  . rosuvastatin  20 mg Oral q1800  . sodium chloride  3 mL Intravenous Q12H  . terazosin  5 mg Oral QHS  . DISCONTD: sodium chloride  3 mL Intravenous Q12H   Infusions:    . sodium chloride    . heparin 1,300 Units/hr (10/02/11 0815)  . nitroGLYCERIN 10 mcg/min  (09/30/11 1251)  . DISCONTD: heparin 1,000 Units/hr (10/01/11 1055)    Assessment: 78 y/o male patient receiving heparin for chest pain/NSTEMI, heparin level therapeutic this am. INR not reported yet. Coumadin, for h/o afib, on hold for cath this am to assess CAD. No bleeding reported.  Goal of Therapy:  Heparin level 0.3-0.7 units/ml   Plan:  Continue gtt at 1300 units/hr and f/u post cath.  Verlene Mayer, PharmD, BCPS Pager [phone] 10/02/2011,9:10 AM

## 2011-10-02 NOTE — Brief Op Note (Signed)
09/29/2011 - 10/02/2011  12:42 PM  PATIENT:  [name] [name]  78 y.o. male  PRE-OPERATIVE DIAGNOSIS:  Cp/dyspnea/LV dysfunction  PROCEDURE:  Procedure(s): LEFT AND RIGHT HEART CATHETERIZATION WITH CORONARY/GRAFT ANGIOGRAM  SURGEON:  Surgeon(s): Lennette Bihari, MD   Full note dictated; see diagram  DICTATION # [phone], [id number]  RA 6 RV 52/6 PA 52/2PC 17 Ao 114/60 LV 114/16  Severe native 4v CAD Patent LIMA to mid LAD Patent SVG to DX1 but occluded sequential limb to Ramus SVG to RCA occluded Patent initial limb of SVG to PDA, but occluded sequential limbs to PLA1 and PLA2.  Medical therapy.  Resume coumadin.  Lennette Bihari, MD, [facility] 10/02/2011 12:42 PM

## 2011-10-02 NOTE — Progress Notes (Signed)
Subjective: Imrpoved SOB  Objective: Vital signs in last 24 hours: Temp:  [97.5 F (36.4 C)-97.7 F (36.5 C)] 97.5 F (36.4 C) (02/04 0558) Pulse Rate:  [70-77] 76  (02/04 0558) Resp:  [16-20] 16  (02/04 0558) BP: (110-127)/(61-66) 110/61 mmHg (02/04 0558) SpO2:  [94 %-98 %] 94 % (02/04 0806) Weight:  [84.2 kg (185 lb 10 oz)] 84.2 kg (185 lb 10 oz) (02/04 0558) Weight change: 0.2 kg (7.1 oz) Last BM Date: 09/30/11 Intake/Output from previous day: -741 02/03 0701 - 02/04 0700 In: 1109 [P.O.:930; I.V.:179] Out: 1650 [Urine:1650] Intake/Output this shift: Total I/O In: -  Out: 400 [Urine:400]  PE: General: no CP  [place]/AT Heart: Irreg irreg, 2/6 sem Lungs: no rales Abd: soft Ext: no edema Neuro: nonfocal   Lab Results:  Basename 10/02/11 0730 09/30/11 0135  WBC 7.8 9.0  HGB 13.8 13.8  HCT 41.0 39.6  PLT 159 164   BMET  Basename 10/01/11 0710 09/29/11 2224  NA 141 142  K 3.3* 3.7  CL 102 108  CO2 23 --  GLUCOSE 115* 138*  BUN 32* 33*  CREATININE 1.57* 1.60*  CALCIUM 9.0 --    Basename 09/30/11 1404 09/30/11 0846  TROPONINI 0.50* 0.61*    No results found for this basename: CHOL, HDL, LDLCALC, LDLDIRECT, TRIG, CHOLHDL   No results found for this basename: HGBA1C     Lab Results  Component Value Date   TSH 5.046* 09/30/2011    Hepatic Function Panel No results found for this basename: PROT,ALBUMIN,AST,ALT,ALKPHOS,BILITOT,BILIDIR,IBILI in the last 72 hours No results found for this basename: CHOL in the last 72 hours No results found for this basename: PROTIME in the last 72 hours    EKG: Orders placed during the hospital encounter of 09/29/11  . EKG 12-LEAD  . EKG 12-LEAD  . EKG 12-LEAD  . EKG 12-LEAD    Studies/Results: No results found.  Medications: I have reviewed the patient's current medications.    Marland Kitchen amLODipine  5 mg Oral Daily  . aspirin  324 mg Oral Pre-Cath  . diazepam  5 mg Oral On Call  . famotidine  20 mg Oral QHS  .  fluticasone  2 puff Inhalation BID  . furosemide  40 mg Intravenous Q12H  . metoprolol tartrate  12.5 mg Oral QHS  . metoprolol tartrate  25 mg Oral Daily  . olmesartan  20 mg Oral Daily  . potassium chloride  40 mEq Oral Q4H  . rosuvastatin  20 mg Oral q1800  . sodium chloride  3 mL Intravenous Q12H  . terazosin  5 mg Oral QHS  . DISCONTD: sodium chloride  3 mL Intravenous Q12H   Assessment/Plan: Patient Active Problem List  Diagnoses  . HYPERLIPIDEMIA  . GOUTY ARTHROPATHY  . HYPERTENSION  . HYPERTENSIVE CARDIOVASCULAR DISEASE  . CORONARY HEART DISEASE, CABG 1999, Last cath 01/2003 with patent grafts, LIMA to LAD, seq. SVG to diagonal and OD, seq. SVG to 2 marginals, and triple sequential SVG to 2 PDA branches and PLA.    Marland Kitchen Atrial fibrillation, chronic- permanent  . GASTROESOPHAGEAL REFLUX DISEASE, CHRONIC  . PEPTIC STRICTURE  . HIATAL HERNIA  . Cough  . GERD (gastroesophageal reflux disease)  . Stricture esophagus  . Gastritis, chronic  . CHF (congestive heart failure), EF 40% with myoview 09/2009  . CKD (chronic kidney disease)  . Chronic anticoagulation, secondary to Chronic a. fib.  Marland Kitchen Unstable angina  . Hypokalemia   PLAN:Cardiac cath today will replace K+ and  increase fluids.    LOS: 3 days   INGOLD,LAURA R 10/02/2011, 8:34 AM  Echo: - Left ventricle: The cavity size was normal. There was mild   concentric hypertrophy. Systolic function was severely   reduced. The estimated ejection fraction was in the range   of 20% to 25%. Akinesis and scarring of the   mid-distalanteroseptal myocardium; consistent with   infarction in the distribution of the left anterior   descending coronary artery. Severe hypokinesis of the   entireanterior, anterolateral, lateral, and apical   myocardium; consistent with ischemia or infarction in the   distribution of the left anterior descending and left   circumflex coronary artery. Features are consistent with a   pseudonormal left  ventricular filling pattern, with   concomitant abnormal relaxation and increased filling   pressure (grade 2 diastolic dysfunction). Doppler   parameters are consistent with elevated mean left atrial   filling pressure. - Aortic valve: Mild to moderate regurgitation directed   centrally in the LVOT. - Ascending aorta: The ascending aorta was severely dilated. - Mitral valve: Calcified annulus. Mild regurgitation   directed centrally. - Left atrium: The atrium was moderately to severely   dilated. - Right ventricle: Systolic function was moderately reduced. - Atrial septum: No defect or patent foramen ovale was   identified.     Patient seen and examined. Agree with assessment and plan.  Discussed with pt. With decline in LV fxn, suspect graft occlusion.  For R and L heart cath today with Aortic root assessment.   Lennette Bihari, MD, [facility] 10/02/2011 8:49 AM

## 2011-10-02 NOTE — Progress Notes (Signed)
ANTICOAGULATION CONSULT NOTE - Follow Up Consult  Pharmacy Consult for Heparin/Coumadin Indication: atrial fibrillation  Allergies  Allergen Reactions  . Cimetidine     REACTION: unknown reaction, pt states per Dr. Jarold Motto  . Ranitidine Hcl     REACTION: unknown reaction, pt states per Dr. Jarold Motto    Patient Measurements: Height: 5\' 11"  (180.3 cm) Weight: 185 lb 10 oz (84.2 kg) IBW/kg (Calculated) : 75.3   Vital Signs: Temp: 98.8 F (37.1 C) (02/04 1344) Temp src: Oral (02/04 1344) BP: 106/60 mmHg (02/04 1344) Pulse Rate: 71  (02/04 1344)  Labs:  Basename 10/02/11 0730 10/01/11 1830 10/01/11 0710 09/30/11 1404 09/30/11 1402 09/30/11 0846 09/30/11 0135 09/29/11 2224 09/29/11 2206  HGB 13.8 -- -- -- -- -- 13.8 -- --  HCT 41.0 -- -- -- -- -- 39.6 41.0 --  PLT 159 -- -- -- -- -- 164 -- 157  APTT -- -- -- -- -- -- -- -- --  LABPROT 18.6* -- 23.0* -- 24.4* -- -- -- --  INR 1.52* -- 2.00* -- 2.15* -- -- -- --  HEPARINUNFRC 0.48 <0.10* -- -- -- -- -- -- --  CREATININE 1.70* -- 1.57* -- -- -- -- 1.60* --  CKTOTAL -- -- -- 81 -- 90 95 -- --  CKMB -- -- -- 2.9 -- 3.1 3.2 -- --  TROPONINI -- -- -- 0.50* -- 0.61* 0.60* -- --   Estimated Creatinine Clearance: 35.1 ml/min (by C-G formula based on Cr of 1.7).   Medications:  Scheduled:    . amLODipine  5 mg Oral Daily  . aspirin  324 mg Oral Pre-Cath  . diazepam  5 mg Oral On Call  . famotidine  20 mg Oral QHS  . fentaNYL      . fluticasone  2 puff Inhalation BID  . furosemide  40 mg Intravenous Q12H  . heparin      . lidocaine      . metoprolol tartrate  12.5 mg Oral QHS  . metoprolol tartrate  25 mg Oral Daily  . midazolam      . nitroGLYCERIN      . olmesartan  20 mg Oral Daily  . potassium chloride  30 mEq Oral Once  . potassium chloride  40 mEq Oral Q4H  . rosuvastatin  20 mg Oral q1800  . terazosin  5 mg Oral QHS  . DISCONTD: sodium chloride  3 mL Intravenous Q12H  . DISCONTD: sodium chloride  3 mL  Intravenous Q12H    Assessment: 78 y/o male patient s/p cath, on chronic coumadin for h/o afib. Plan to medically manage CAD, ok to resume heparin 6 hours post sheath pull. Heparin level this am was therapeutic, INR currently subtherapeutic since doses held.  Goal of Therapy:  INR 2-3 Heparin level 0.3-0.7 units/ml   Plan:  Resume heparin at 1900 today with rate of 1300 units/hr and give coumadin 2.5mg  today. Will check heparin level in 8 hours.  Verlene Mayer, PharmD, BCPS Pager [phone] 10/02/2011,2:05 PM

## 2011-10-03 ENCOUNTER — Ambulatory Visit: Payer: Medicare Other | Admitting: Critical Care Medicine

## 2011-10-03 LAB — CBC
HCT: 38.8 % — ABNORMAL LOW (ref 39.0–52.0)
MCV: 88.4 fL (ref 78.0–100.0)
Platelets: 130 10*3/uL — ABNORMAL LOW (ref 150–400)
RBC: 4.39 MIL/uL (ref 4.22–5.81)
WBC: 7.5 10*3/uL (ref 4.0–10.5)

## 2011-10-03 LAB — BASIC METABOLIC PANEL
BUN: 33 mg/dL — ABNORMAL HIGH (ref 6–23)
Calcium: 8.9 mg/dL (ref 8.4–10.5)
Chloride: 105 mEq/L (ref 96–112)
Creatinine, Ser: 1.64 mg/dL — ABNORMAL HIGH (ref 0.50–1.35)

## 2011-10-03 LAB — PROTIME-INR: INR: 1.35 (ref 0.00–1.49)

## 2011-10-03 LAB — HEPARIN LEVEL (UNFRACTIONATED): Heparin Unfractionated: 0.23 IU/mL — ABNORMAL LOW (ref 0.30–0.70)

## 2011-10-03 MED ORDER — WARFARIN SODIUM 4 MG PO TABS
4.0000 mg | ORAL_TABLET | Freq: Once | ORAL | Status: AC
  Administered 2011-10-03: 4 mg via ORAL
  Filled 2011-10-03 (×2): qty 1

## 2011-10-03 NOTE — Cardiovascular Report (Signed)
NAME:  [name] [name], [name] [name] NO.:  [id number]  MEDICAL RECORD NO.:  [id number]  LOCATION:  4708                         FACILITY:  [facility]  PHYSICIAN:  Nicki Guadalajara, M.D.     DATE OF BIRTH:  1926/02/18  DATE OF PROCEDURE:  10/02/2011 DATE OF DISCHARGE:                           CARDIAC CATHETERIZATION   Mr. [name] [name] is an 78 year old gentleman who is status post CABG revascularization surgery in 1999 by Dr. Tyrone Sage.  He has a history of COPD.  He has a known ascending aortic root aneurysm followed by Dr. Tyrone Sage.  He has history of chronic permanent atrial fibrillation on Coumadin therapy.  He had last seen Dr. [name] in December 2012.  He had been felt to have an ejection fraction in the 40-45% range. Recently, he developed increasing episodes of shortness of breath leading to his presentation with CHF.  An echo Doppler study has now shown an EF of approximately 20% felt most likely to be ischemic in etiology.  He is now referred for right and left heart catheterization.  The patient's Coumadin had been held.  His INR today was 1.52.  PROCEDURE:  After premedication with Versed 1 mg plus fentanyl 25 mcg, the patient was prepped and draped in usual fashion.  His right femoral artery was punctured anteriorly and a 5-French sheath was inserted.  A 7- French venous sheath was inserted for right heart catheterization.  Swan- Ganz catheterization was done with pressures being obtained in the RA, RV, PA and PC positions.  Cardiac output was obtained by the thermodilution method and also assumed Fick method.  Pigtail catheter was then inserted.  Simultaneous AO and PA pressures were recorded.  The catheter was then passed into the LV.  LV-PC pressures were recorded. RAO ventriculography was performed.  An LV-AO pullback was then recorded under hemodynamic monitoring continuously.  Due to this history of ascending aortic aneurysm,  central aortography was performed to  also evaluate his potential for aortic insufficiency.  Attention was then directed at the coronary anatomy.  A 5-French 6 curve was necessary for selective angiography into the native left system due to the marked ascending aortic dilatation.  A 5-French, 4 curve was used for angiography into the native right coronary artery.  This catheter was able to selectively cannulate the bypass graft, which supposedly was most superior and supplied the diagonal and ramus initially sequentially.  This was unable to selectively cannulate the 2 remaining vein grafts.  A LIMA catheter was then inserted and selective angiography in the left internal mammary artery was performed, visualizing the LIMA to LAD.  The LIMA catheter was then also able to selectively cannulate the second graft which was a graft supplying the right coronary artery.  The most  distal graft was a previous sequential graft supplying the PDA, PLA 1 and PLA 2.  A right bypass graft catheter was necessary for selective engagement into this graft which only visualized the PD vessel.  The patient tolerated the procedure well.  He left the room in satisfactory condition with stable hemodynamics.  HEMODYNAMIC DATA:  Right atrial pressure 6.  Right ventricular pressure 52/6.  Pulmonary artery pressure 52/27,  mean pulmonary capillary wedge pressure initially 17.  Central aortic pressure 114/60.  Left ventricular pressure 114/13, post A-wave 16.  O2 saturation in the PA 43%, O2 saturation in the LV 96%.  Cardiac output by the thermodilution method was 3.1, and by the Fick method was 2.7 L/minute.  Cardiac index was 1.5-1.3 L/min/m2 respectively.  ANGIOGRAPHIC DATA:  Left main coronary artery had ostial proximal narrowing of 70-80%, and distal narrowing of 40% before bifurcating into an LAD and left circumflex system.  The LAD gave rise to a proximal diagonal vessel.  There was 80% proximal stenosis in this diagonal vessel before the  graft anastomoses.  The LAD was occluded after this diagonal vessel and after a very small septal perforating artery.  The circumflex vessel had 80% proximal stenosis.  The high marginal was occluded.  There was faint collateralization to this high marginal intermediate like vessel antegrade via bridging collaterals.  The native right coronary artery was totally occluded proximally after giving rise to a proximal branch.  The proximal branch had 90 followed by 70% stenoses.  The left internal mammary artery was widely patent.  This anastomosed into the mid LAD.  There was diffuse irregularity with 40-50% narrowing in the mid distal apical portion of the LAD with filling antegrade up to a septal perforating artery, and then the antegrade LAD was occluded proximal to this.  The highest vein graft previously was a sequential graft supplying the diagonal and ramus intermediate vessel.  The vein graft to the diagonal was the only part visualized.  The distal diagonal was large caliber vessel which bifurcated into several branches.  Via this graft,  there was again filling of the proximal LAD and into the proximal circumflex, but the ramus was not visualized filling from this graft.  The second graft was a vein graft to the right coronary artery which was occluded at its origin.  The third vein graft was a previous sequential vein graft supplying the PD, PLA 1 and PLA 2, only visualization to the PD vessel was demonstrated, and the PD 1 vessel was small caliber with mild irregularities.  RAO ventriculography revealed severely dilated LV with an ejection fraction of 15-20%.  There was akinesis of the mid to basal inferior wall, and severe diffuse global hypocontractility.  The aortic root was significantly dilated.  There was 2+ angiographic aortic insufficiency.  IMPRESSION: 1. Severe ischemic cardiomyopathy with an ejection fraction of 15-20%. 2. Moderate pulmonary hypertension with  pulmonary artery systolic     pressure approximately 52 mm. 3. Significant ascending aortic aneurysm/dilatation with 2+     angiographic aortic insufficiency. 4. Significant native coronary artery disease with 70-80% left main     stenosis proximally, 40% distally; total occlusion of the proximal     left anterior descending after giving rise to a large diagonal     vessel with 80% proximal stenosis into the diagonal vessel. 5. An 80% proximal circumflex stenosis with total occlusion of a     higher obtuse marginal 1 ramus like branch. 6. Total occlusion of the proximal native right coronary artery after     diffusely diseased  proximal branch. 7. Patent left internal mammary artery to the mid left anterior     descending with a small mid distal left anterior descending with     diffuse 40-50% narrowings. 8. Patent initial limb of the vein graft supplying a diagonal vessel,     but without visualization of the sequential limb which previously  supplied a ramus intermediate vessel. 9. Occluded vein graft to the right coronary artery. 10.Patent initial limb of the vein graft supplying the posterior     descending artery with occlusion of the previous sequential limb     supplying the posterolateral artery 1 and posterolateral artery 2     vessel.  RECOMMENDATION:  Medical therapy.  It is felt that the patient's significant reduction in LV function from his prior assessment most likely is due to graft occlusion leading to his diffuse severe global hypocontractility.  He will be hydrated gently and restarted back on Coumadin anticoagulation with titration of medical therapy.          ______________________________ Nicki Guadalajara, M.D.     TK/MEDQ  D:  10/02/2011  T:  10/03/2011  Job:  [id number]  cc:   Gerlene Burdock A. [name], M.D. Ernestina Penna, M.D. Charlcie Cradle Delford Field, MD, FCCP Dr. Tyrone Sage

## 2011-10-03 NOTE — Significant Event (Signed)
Pt. had 7 beats of VTach. Pt. was asymptomatic, stated he had recently used the urinal at bedside. Called Dr.on-call Leron Croak, [place], [place]- on-call for Dr. Nicki Guadalajara) for [facility]. He stated no intervention needed, just continue monitoring pt. and he will look over meds to see if anything needs to be done. Will continue to monitor pt.

## 2011-10-03 NOTE — Clinical Documentation Improvement (Signed)
CKD DOCUMENTATION CLARIFICATION QUERY   THIS DOCUMENT IS NOT A PERMANENT PART OF THE MEDICAL RECORD   Please update your documentation within the medical record to reflect your response to this query.                                                                                         10/03/11   Ms. [name] [name] and/or Associates,  In a better effort to capture your patient's severity of illness, reflect appropriate length of stay and utilization of resources, a review of the patient medical record has revealed the following indicators.    Based on your clinical judgment, please STAGE the patient's chronic kidney disease in the progress notes and discharge summary:   - CKD Stage II - GFR 60-80  - CKD Stage III - GFR 30-59  - CKD Stage IV - GFR 15-29  - CKD Stage V - GFR < 15  - Other Condition_____________  - Unable to Clinically Determine   Supporting Information:  [name] at baseline was 1.37 last March his current cr. Is slightly up at 1.6 would watch while on higher dose of lasix to avoid over diuresis. If any evidence of K increase would stop ARB [name] [name],[name] [name]  09/29/2011, 11:59 PM    H&P  chronic kidney disease  Stable [name] [name],[name] [name]  09/30/2011, 9:40 AM     Progress Note  CKD (chronic kidney disease)  [name] [name],[name] [name]  10/03/2011, 9:36 AM     Progress Note  BUN range this admission 32 - 36  Creat range this admission 1.57 - 1.70  GFR range this admission 39 - 35  In responding to this query please exercise your independent judgment.  The fact that a query is asked, does not imply that any particular answer is desired or expected.   Reviewed: stage 3 ckd documented in pn 10/04/11 by dr. Herbie Baltimore.   Mathis Dad RB   Thank You,  Jerral Ralph  RN BSN Certified Clinical Documentation Specialist: Cell   [phone]  Health Information Management [place]   TO RESPOND TO THE THIS QUERY, FOLLOW THE INSTRUCTIONS BELOW:  1. If needed, update  documentation for the patient's encounter via the notes activity.  2. Access this query again and click edit on the In [employer].  3. After updating, or not, click F2 to complete all highlighted (required) fields concerning your review. Select "additional documentation in the medical record" OR "no additional documentation provided".  4. Click Sign note button.  5. The deficiency will fall out of your In Basket *Please let us know if you are not able to complete this workflow by phone or e-mail (listed below).

## 2011-10-03 NOTE — Progress Notes (Signed)
Pt evaluated for long term disease management services with [facility] program as a benefit of [employer]. Pt reports that he does not weigh at home and has varied adherence with medications, although he appears very knowledgeable of his medical issues.  RN case manager will do a post d/c transition of care call and monthly home visits for assessments and education for CHF, Atrial Fib, CAD, hyperlipidemia and other needs. GCPulliam, RN, CCM  Gerilyn Stargell C. Roena Malady, RN, MS, CCM [facility] Liaison [facility] [phone]

## 2011-10-03 NOTE — Progress Notes (Signed)
Subjective: No complaints, breathing is much easier.  Objective: Vital signs in last 24 hours: Temp:  [97.5 F (36.4 C)-98.8 F (37.1 C)] 97.9 F (36.6 C) (02/05 0434) Pulse Rate:  [63-85] 74  (02/05 0434) Resp:  [16-20] 20  (02/05 0434) BP: (106-117)/(60-67) 107/64 mmHg (02/05 0434) SpO2:  [94 %-97 %] 94 % (02/05 0857) Weight:  [82.6 kg (182 lb 1.6 oz)] 82.6 kg (182 lb 1.6 oz) (02/05 0434) Weight change: -1.6 kg (-3 lb 8.4 oz) Last BM Date: 10/02/11 Intake/Output from previous day: -1320 02/04 0701 - 02/05 0700 In: 1712.2 [P.O.:600; I.V.:1112.2] Out: 2500 [Urine:2500] Intake/Output this shift: Total I/O In: 240 [P.O.:240] Out: 550 [Urine:550]  PE: General:A&O X 3, mae, pleasant affect family in room Neck: no JVD at 60degrees Heart:S1S2 irreg, irreg. Lungs:Clear without rales, rhonchi or wheezes. Abd:+BS, soft non tender. Ext:no edema, cath site with no hematoma.  Tele: A fib rate controlled chronic  Wt: 82.6, down from 85 on admit   Lab Results:  Basename 10/03/11 0615 10/02/11 0730  WBC 7.5 7.8  HGB 13.1 13.8  HCT 38.8* 41.0  PLT 130* 159   BMET  Basename 10/03/11 0615 10/02/11 0730  NA 139 139  K 3.9 3.7  CL 105 102  CO2 23 25  GLUCOSE 111* 121*  BUN 33* 36*  CREATININE 1.64* 1.70*  CALCIUM 8.9 9.5    Basename 09/30/11 1404  TROPONINI 0.50*    No results found for this basename: CHOL, HDL, LDLCALC, LDLDIRECT, TRIG, CHOLHDL   No results found for this basename: HGBA1C     Lab Results  Component Value Date   TSH 5.046* 09/30/2011    Hepatic Function Panel No results found for this basename: PROT,ALBUMIN,AST,ALT,ALKPHOS,BILITOT,BILIDIR,IBILI in the last 72 hours No results found for this basename: CHOL in the last 72 hours No results found for this basename: PROTIME in the last 72 hours    EKG: Orders placed during the hospital encounter of 09/29/11  . EKG 12-LEAD  . EKG 12-LEAD  . EKG 12-LEAD  . EKG 12-LEAD  . EKG     Studies/Results: No results found.  Medications: I have reviewed the patient's current medications.    Marland Kitchen amLODipine  5 mg Oral Daily  . famotidine  20 mg Oral QHS  . fentaNYL      . fluticasone  2 puff Inhalation BID  . furosemide  40 mg Intravenous Q12H  . heparin      . lidocaine      . metoprolol tartrate  12.5 mg Oral QHS  . metoprolol tartrate  25 mg Oral Daily  . midazolam      . nitroGLYCERIN      . olmesartan  20 mg Oral Daily  . potassium chloride  30 mEq Oral Once  . rosuvastatin  20 mg Oral q1800  . terazosin  5 mg Oral QHS  . warfarin  2.5 mg Oral ONCE-1800  . DISCONTD: sodium chloride  3 mL Intravenous Q12H   Assessment/Plan: Patient Active Problem List  Diagnoses  . HYPERLIPIDEMIA  . GOUTY ARTHROPATHY  . HYPERTENSION  . HYPERTENSIVE CARDIOVASCULAR DISEASE  . CORONARY HEART DISEASE, CABG 1999, Cath 01/2003 with patent grafts, LIMA to LAD, seq. SVG to diagonal and OD, seq. SVG to 2 marginals, and triple sequential SVG to 2 PDA branches and PLA.    Marland Kitchen Atrial fibrillation, chronic- permanent  . GASTROESOPHAGEAL REFLUX DISEASE, CHRONIC  . PEPTIC STRICTURE  . HIATAL HERNIA  . Cough  . GERD (gastroesophageal reflux  disease)  . Stricture esophagus  . Gastritis, chronic  . CHF (congestive heart failure), EF 40% with myoview 09/2009, now with echo 09/30/11 EF 20%  . CKD (chronic kidney disease)  . Chronic anticoagulation, secondary to Chronic a. fib.  Marland Kitchen Unstable angina, SOB anginal Equivilant  . Hypokalemia  . CAD (coronary artery disease) of bypass graft,SVG to RCA occluded, Seq. limb of SVG to PLA1 &PLA2 occluded, Seq. VG to Ramus occluded, PATENT LIMA to LAD, Patent VG to DX1, Patent Vg to PDA 10/02/11  . Ischemic cardiomyopathy   PLAN: On heparin, coumadin crossover.  IV ntg @ . Will begin IMDUR and d/c ntg drip. Decrease IV fluids to KVO.  Ambulate with cardiac rehab.  LOS: 4 days   INGOLD,LAURA R 10/03/2011, 9:36 AM   Long disc. With pt. & family. Med  tx. For cad & isch. Myopathy. Has QRS .11 may benefit from BivAICD. Will need op ec ho in 60 days. Disc. Limited salt diet . Needs life alert ( lives alone ). Agree with above

## 2011-10-03 NOTE — Progress Notes (Addendum)
ANTICOAGULATION CONSULT NOTE - Follow Up Consult  Pharmacy Consult for Coumadin Indication: atrial fibrillation  Allergies  Allergen Reactions  . Cimetidine     REACTION: unknown reaction, pt states per Dr. Jarold Motto  . Ranitidine Hcl     REACTION: unknown reaction, pt states per Dr. Jarold Motto    Patient Measurements: Height: 5\' 11"  (180.3 cm) Weight: 182 lb 1.6 oz (82.6 kg) (a scale) IBW/kg (Calculated) : 75.3   Vital Signs: Temp: 97.9 F (36.6 C) (02/05 0434) BP: 107/64 mmHg (02/05 0434) Pulse Rate: 74  (02/05 0434)  Labs:  Basename 10/03/11 0615 10/02/11 1426 10/02/11 0730 10/01/11 0710  HGB 13.1 -- 13.8 --  HCT 38.8* -- 41.0 --  PLT 130* -- 159 --  APTT -- -- -- --  LABPROT 16.9* -- 18.6* 23.0*  INR 1.35 -- 1.52* 2.00*  HEPARINUNFRC 0.23* <0.10* 0.48 --  CREATININE 1.64* -- 1.70* 1.57*  CKTOTAL -- -- -- --  CKMB -- -- -- --  TROPONINI -- -- -- --   Estimated Creatinine Clearance: 36.3 ml/min (by C-G formula based on Cr of 1.64).   Medications:  Scheduled:    . amLODipine  5 mg Oral Daily  . famotidine  20 mg Oral QHS  . fluticasone  2 puff Inhalation BID  . furosemide  40 mg Intravenous Q12H  . metoprolol tartrate  12.5 mg Oral QHS  . metoprolol tartrate  25 mg Oral Daily  . olmesartan  20 mg Oral Daily  . potassium chloride  30 mEq Oral Once  . rosuvastatin  20 mg Oral q1800  . terazosin  5 mg Oral QHS  . warfarin  2.5 mg Oral ONCE-1800    Assessment: 78 y/o male patient s/p cath on 2/4, on chronic coumadin for h/o afib. Plan to medically manage CAD, now on heparin bridge to coumadin INR currently subtherapeutic since doses held on previous days. Heparin level pending  Goal of Therapy:  INR 2-3   Plan:  1. Coumadin 4mg  po x 1 2. F/u heparin level when available 3. F/u INR tomorrow am  Riki Rusk 10/03/2011,3:23 PM   Heparin level = 0.4 on 1400 units/hr. Heparin level therapeutic. No bleeding reported. Plan: continue heparin at 1400  unit/hr, am HL and INR 10/03/2011 4:48 PM .Len Childs T

## 2011-10-03 NOTE — Progress Notes (Signed)
Patient was eating his dinner when CSW stopped by. CSW will return tomorrow to complete a comprehensive psycho-social.

## 2011-10-03 NOTE — Clinical Documentation Improvement (Signed)
CHF DOCUMENTATION CLARIFICATION QUERY  THIS DOCUMENT IS NOT A PERMANENT PART OF THE MEDICAL RECORD  Please update your documentation within the medical record to reflect your response to this query.                                                                                     10/03/11  Ms. Gibson Telleria and/or Associates,  In a better effort to capture your patient's severity of illness, reflect appropriate length of stay and utilization of resources, a review of the patient medical record has revealed the following indicators regarding the diagnosis of Heart Failure.    Based on your clinical judgment, please document the ACUITY and TYPE of Heart Failure in the progress notes and discharge summary:   ACUITY:  - ACUTE  - CHRONIC  - ACUTE ON CHRONIC   AND  TYPE:  - SYSTOLIC  - DIASTOLIC  - COMBINED   In responding to this query please exercise your independent judgment.  The fact that a query is asked, does not imply that any particular answer is desired or expected.   Reviewed:   Thank You,  Jerral Ralph  RN BSN Certified Clinical Documentation Specialist: Cell   [phone]  Health Information Management [place]  TO RESPOND TO THE THIS QUERY, FOLLOW THE INSTRUCTIONS BELOW:  1. If needed, update documentation for the patient's encounter via the notes activity.  2. Access this query again and click edit on the In [employer].  3. After updating, or not, click F2 to complete all highlighted (required) fields concerning your review. Select "additional documentation in the medical record" OR "no additional documentation provided".  4. Click Sign note button.  5. The deficiency will fall out of your In Basket *Please let us know if you are not able to complete this workflow by phone or e-mail (listed below).    Yes, I would have done this at discharge, but on admit we did not know his EF was 20%.  Also there was concern it was ischemic mediated,  So now we have  to update the problem list in less than 24 hours?

## 2011-10-03 NOTE — Progress Notes (Signed)
ANTICOAGULATION CONSULT NOTE - Follow Up Consult  Pharmacy Consult for heparin Indication: atrial fibrillation  Labs:  Basename 10/03/11 0615 10/02/11 1426 10/02/11 0730 10/01/11 0710 09/30/11 1404 09/30/11 0846  HGB 13.1 -- 13.8 -- -- --  HCT 38.8* -- 41.0 -- -- --  PLT 130* -- 159 -- -- --  APTT -- -- -- -- -- --  LABPROT 16.9* -- 18.6* 23.0* -- --  INR 1.35 -- 1.52* 2.00* -- --  HEPARINUNFRC 0.23* <0.10* 0.48 -- -- --  CREATININE -- -- 1.70* 1.57* -- --  CKTOTAL -- -- -- -- 81 90  CKMB -- -- -- -- 2.9 3.1  TROPONINI -- -- -- -- 0.50* 0.61*   Assessment/Plan: 78yo male slightly subtherapeutic on heparin for Afib after resumed post-cath; had been therapeutic x1 at current rate prior to stopping for cath but there is room within goal range so will ensure therapeutic level with slightly increased rate of 1400 units/hr and check level in 8hr.  Colleen Can PharmD BCPS 10/03/2011,7:08 AM

## 2011-10-04 DIAGNOSIS — I472 Ventricular tachycardia: Secondary | ICD-10-CM | POA: Diagnosis not present

## 2011-10-04 LAB — CBC
HCT: 37.4 % — ABNORMAL LOW (ref 39.0–52.0)
Hemoglobin: 12.9 g/dL — ABNORMAL LOW (ref 13.0–17.0)
MCHC: 34.5 g/dL (ref 30.0–36.0)
RBC: 4.25 MIL/uL (ref 4.22–5.81)
WBC: 7.6 10*3/uL (ref 4.0–10.5)

## 2011-10-04 LAB — PRO B NATRIURETIC PEPTIDE: Pro B Natriuretic peptide (BNP): 6196 pg/mL — ABNORMAL HIGH (ref 0–450)

## 2011-10-04 LAB — BASIC METABOLIC PANEL
BUN: 36 mg/dL — ABNORMAL HIGH (ref 6–23)
CO2: 25 mEq/L (ref 19–32)
Chloride: 105 mEq/L (ref 96–112)
GFR calc non Af Amer: 33 mL/min — ABNORMAL LOW (ref >90)
Glucose, Bld: 110 mg/dL — ABNORMAL HIGH (ref 70–99)
Potassium: 3.6 mEq/L (ref 3.5–5.1)
Sodium: 142 mEq/L (ref 135–145)

## 2011-10-04 LAB — HEPARIN LEVEL (UNFRACTIONATED): Heparin Unfractionated: 0.64 IU/mL (ref 0.30–0.70)

## 2011-10-04 LAB — PROTIME-INR
INR: 1.33 (ref 0.00–1.49)
Prothrombin Time: 16.7 seconds — ABNORMAL HIGH (ref 11.6–15.2)

## 2011-10-04 MED ORDER — FUROSEMIDE 40 MG PO TABS
40.0000 mg | ORAL_TABLET | Freq: Two times a day (BID) | ORAL | Status: DC
  Administered 2011-10-05 – 2011-10-08 (×7): 40 mg via ORAL
  Filled 2011-10-04 (×9): qty 1

## 2011-10-04 MED ORDER — WARFARIN SODIUM 5 MG PO TABS
5.0000 mg | ORAL_TABLET | Freq: Once | ORAL | Status: AC
  Administered 2011-10-04: 5 mg via ORAL
  Filled 2011-10-04: qty 1

## 2011-10-04 MED ORDER — CARVEDILOL 3.125 MG PO TABS
3.1250 mg | ORAL_TABLET | Freq: Two times a day (BID) | ORAL | Status: DC
  Administered 2011-10-04: 3.125 mg via ORAL
  Filled 2011-10-04 (×3): qty 1

## 2011-10-04 NOTE — Plan of Care (Signed)
Problem: Food- and Nutrition-Related Knowledge Deficit (NB-1.1) Goal: Nutrition education Formal process to instruct or train a patient/client in a skill or to impart knowledge to help patients/clients voluntarily manage or modify food choices and eating behavior to maintain or improve health.  Outcome: Completed/Met Date Met:  10/04/11 RD consulted for diet education, has already received Heart Failure booklet and associated education documented. Patient has been following a low sodium diet at home for many years. He is very familiar with the foods that contain sodium and stratagies  For reducing sodium intake. RD covered foods that have hidden sources of sodium. Patient was able to verbalize several changes he could make with his diet to lower his sodium intake including asking for his meals to be prepared with less salt at restaurants. Patient was given low sodium nutrition therapy hand out from the [employer]. Patient had no further questions. RD reviewed chart, no additional nutrition interventions at this time. Diet low sodoium, Po intake 75-100%.

## 2011-10-04 NOTE — Progress Notes (Addendum)
Subjective:  One episode last pm when he thought he would be SOB but he never had SOB.  No lightheadedness no dizziness.  Objective: Vital signs in last 24 hours: Temp:  [97.5 F (36.4 C)-98.8 F (37.1 C)] 98.2 F (36.8 C) (02/06 0859) Pulse Rate:  [70-90] 78  (02/06 0859) Resp:  [16-18] 18  (02/06 0859) BP: (101-120)/(57-72) 120/72 mmHg (02/06 0859) SpO2:  [95 %-99 %] 97 % (02/06 0859) Weight:  [81.7 kg (180 lb 1.9 oz)] 81.7 kg (180 lb 1.9 oz) (02/06 0501) Weight change: -0.9 kg (-1 lb 15.8 oz) Last BM Date: 10/03/11 Intake/Output from previous day:-977 02/05 0701 - 02/06 0700 In: 790 [P.O.:790] Out: 2275 [Urine:2275] Intake/Output this shift: Total I/O In: 300 [P.O.:300] Out: 875 [Urine:875]  PE: General:A&O X 3, MAE, follows commands Neck:no JVD Heart:S1S2 irreg irreg,  Lungs:clear Abd:+ BS, soft non tender Ext:no edema  WT: 81.7 down from 85 kg on admit.  Tele: NSVT  7 beats  Chronic a fib  Lab Results:  Basename 10/04/11 0530 10/03/11 0615  WBC 7.6 7.5  HGB 12.9* 13.1  HCT 37.4* 38.8*  PLT 138* 130*   BMET  Basename 10/04/11 0530 10/03/11 0615  NA 142 139  K 3.6 3.9  CL 105 105  CO2 25 23  GLUCOSE 110* 111*  BUN 36* 33*  CREATININE 1.81* 1.64*  CALCIUM 9.3 8.9    No results found for this basename: HGBA1C     Lab Results  Component Value Date   TSH 5.046* 09/30/2011    Medications: I have reviewed the patient's current medications.    Marland Kitchen amLODipine  5 mg Oral Daily  . famotidine  20 mg Oral QHS  . fluticasone  2 puff Inhalation BID  . furosemide  40 mg Intravenous Q12H  . metoprolol tartrate  12.5 mg Oral QHS  . metoprolol tartrate  25 mg Oral Daily  . olmesartan  20 mg Oral Daily  . rosuvastatin  20 mg Oral q1800  . terazosin  5 mg Oral QHS  . warfarin  4 mg Oral ONCE-1800  . warfarin  5 mg Oral ONCE-1800   Assessment/Plan: Principal Problem:  *Acute systolic congestive heart failure, previous to this admit EF 40% now 20% Active  Problems:  CORONARY HEART DISEASE, CABG 1999, Cath 01/2003 with patent grafts, LIMA to LAD, seq. SVG to diagonal and OD, seq. SVG to 2 marginals, and triple sequential SVG to 2 PDA branches and PLA.    Unstable angina, SOB anginal Equivilant  CAD (coronary artery disease) of bypass graft,SVG to RCA occluded, Seq. limb of SVG to PLA1 &PLA2 occluded, Seq. VG to Ramus occluded, PATENT LIMA to LAD, Patent VG to DX1, Patent Vg to PDA 10/02/11  Ischemic cardiomyopathy  Ventricular tachycardia, non-sustained; secondary to Ischemic CM  Atrial fibrillation, chronic- permanent  CKD (chronic kidney disease) stage 3, GFR 30-59 ml/min  Chronic renal disease, stage III  HYPERLIPIDEMIA  HYPERTENSION  Chronic anticoagulation, secondary to Chronic a. fib.  Hypokalemia   PLAN: Now with NSVT episodes. ProBNP 6196.   Heparin coumadin crossover. For chronic A fib, INR sub therapeutic  Case manager is arranging home health.  Will ask dietician to see for 2 gm Na+ diet. Still on IV Lasix - can convert to PO for this PM dose & reassess Creatinine.  Pt. Is a DNR per chart order - but he specifically noted to me that were he to go into VT, that he would be in favor of defibrillation - just  does not want prolonged life-support.  Will plan for Life-Vest based on long discussion per Dr. Clarene Duke mentioning possibility of BiVAICD.  NTG still infusing was to be d/c'd yesterday. Will d/c now & exchange for IMDUR.  Can increase BB to 25mg  BID (long active Metoprolol vs. Carvedilol would be more appropriate for long term management ASA & Statin along with ARB.  On Amlodipine as well as ARB for BP - will convert from Amlodipine + Metoprolol to Carvedilol.    Ambulate with rehab - will need to assess home needs such as life-alert & home-health as he lives alone.  Will f/u @ [facility] - op echo after ~4months to reassess LV function with optimization of cardiac medications - at that time can re-address the potential of BiVAICD.     LOS: 5 days   [name] [name],[name] [name] 10/04/2011, 11:17 AM

## 2011-10-04 NOTE — Progress Notes (Signed)
CSW met with patient to provide support and provide education on CHF management. CSW also assessed for the need for home health. Patient reported some knowledge of CHF management. Pt does not weigh himself daily but intends to start weighing himself. Pt is aware of low sodium diet, however, it is unclear if he manages his diet strictly. Pt does report that he follows up regularly with all of his physicians and adheres to all medications prescribed. Pt. Lives alone and is fairly independent. However, his son does live 5 minutes away and is available to assist him when needed. Pt. Is already set up with [facility] but does have some desire to have HH with [facility] to manage his CHF. CSW will assist in setting this up for the patient.  Clinical Social worker completed  A comprehensive psychosocial assessment which can be found in the shadow chart. Clinical Social Worker will sign off for now as social work intervention is no longer needed. Please consult [place] again if new need arises.

## 2011-10-04 NOTE — Progress Notes (Signed)
ANTICOAGULATION CONSULT NOTE - Follow Up Consult  Pharmacy Consult for Coumadin  Indication: atrial fibrillation  Allergies  Allergen Reactions  . Cimetidine     REACTION: unknown reaction, pt states per Dr. Jarold Motto  . Ranitidine Hcl     REACTION: unknown reaction, pt states per Dr. Jarold Motto    Patient Measurements: Height: 5\' 11"  (180.3 cm) Weight: 180 lb 1.9 oz (81.7 kg) (bed) IBW/kg (Calculated) : 75.3  Heparin Dosing Weight: 82kg  Vital Signs: Temp: 98.2 F (36.8 C) (02/06 0859) Temp src: Oral (02/06 0859) BP: 120/72 mmHg (02/06 0859) Pulse Rate: 78  (02/06 0859)  Labs:  Basename 10/04/11 0530 10/03/11 1549 10/03/11 0615 10/02/11 0730  HGB 12.9* -- 13.1 --  HCT 37.4* -- 38.8* 41.0  PLT 138* -- 130* 159  APTT -- -- -- --  LABPROT 16.7* -- 16.9* 18.6*  INR 1.33 -- 1.35 1.52*  HEPARINUNFRC 0.64 0.40 0.23* --  CREATININE 1.81* -- 1.64* 1.70*  CKTOTAL -- -- -- --  CKMB -- -- -- --  TROPONINI -- -- -- --   Estimated Creatinine Clearance: 32.9 ml/min (by C-G formula based on Cr of 1.81).   Medications:  Scheduled:    . amLODipine  5 mg Oral Daily  . famotidine  20 mg Oral QHS  . fluticasone  2 puff Inhalation BID  . furosemide  40 mg Intravenous Q12H  . metoprolol tartrate  12.5 mg Oral QHS  . metoprolol tartrate  25 mg Oral Daily  . olmesartan  20 mg Oral Daily  . rosuvastatin  20 mg Oral q1800  . terazosin  5 mg Oral QHS  . warfarin  4 mg Oral ONCE-1800    Assessment: 78 y/o male patient s/p cath on 2/4, on chronic coumadin for h/o afib. Plan to medically manage CAD, now on heparin bridge to coumadin INR remains subtherapeutic since doses held on previous days. Heparin level therapeutic on 1400 units/hr  Goal of Therapy:  Heparin level 0.3-0.7 units/ml until INR = 2-3   Plan:  1. Continue heparin at current rate 2. Coumadin 5mg  po x 1 3. F/u am heparin level and INR  Riki Rusk 10/04/2011,10:05 AM

## 2011-10-05 LAB — CBC
MCH: 30.2 pg (ref 26.0–34.0)
Platelets: 141 10*3/uL — ABNORMAL LOW (ref 150–400)
RBC: 4.44 MIL/uL (ref 4.22–5.81)

## 2011-10-05 LAB — BASIC METABOLIC PANEL
BUN: 38 mg/dL — ABNORMAL HIGH (ref 6–23)
GFR calc Af Amer: 38 mL/min — ABNORMAL LOW (ref >90)
GFR calc non Af Amer: 33 mL/min — ABNORMAL LOW (ref >90)
Potassium: 4 mEq/L (ref 3.5–5.1)
Sodium: 138 mEq/L (ref 135–145)

## 2011-10-05 LAB — PROTIME-INR
INR: 1.45 (ref 0.00–1.49)
Prothrombin Time: 17.9 seconds — ABNORMAL HIGH (ref 11.6–15.2)

## 2011-10-05 LAB — PRO B NATRIURETIC PEPTIDE: Pro B Natriuretic peptide (BNP): 6705 pg/mL — ABNORMAL HIGH (ref 0–450)

## 2011-10-05 LAB — HEPARIN LEVEL (UNFRACTIONATED): Heparin Unfractionated: 0.15 IU/mL — ABNORMAL LOW (ref 0.30–0.70)

## 2011-10-05 MED ORDER — WARFARIN SODIUM 7.5 MG PO TABS
7.5000 mg | ORAL_TABLET | Freq: Once | ORAL | Status: AC
  Administered 2011-10-05: 7.5 mg via ORAL
  Filled 2011-10-05: qty 1

## 2011-10-05 MED ORDER — CARVEDILOL 6.25 MG PO TABS
6.2500 mg | ORAL_TABLET | Freq: Two times a day (BID) | ORAL | Status: DC
  Administered 2011-10-05 – 2011-10-08 (×7): 6.25 mg via ORAL
  Filled 2011-10-05 (×9): qty 1

## 2011-10-05 NOTE — Progress Notes (Signed)
ANTICOAGULATION CONSULT NOTE - Follow Up Consult  Pharmacy Consult for Coumadin Indication: atrial fibrillation  Labs:  Basename 10/05/11 0625 10/04/11 0530 10/03/11 1549 10/03/11 0615 10/02/11 0730  HGB 13.4 12.9* -- -- --  HCT 39.3 37.4* -- 38.8* --  PLT PENDING 138* -- 130* --  APTT -- -- -- -- --  LABPROT 17.9* 16.7* -- 16.9* --  INR 1.45 1.33 -- 1.35 --  HEPARINUNFRC 0.15* 0.64 0.40 -- --  CREATININE -- 1.81* -- 1.64* 1.70*  CKTOTAL -- -- -- -- --  CKMB -- -- -- -- --  TROPONINI -- -- -- -- --   Assessment: 78yo male now subtherapeutic on heparin for Afib after two therapeutic levels.  Goal of Therapy:  Heparin level 0.3-0.7 units/ml   Plan:  Will increase heparin by ~4 units/kg/hr to 1650 units/hr and check level in 8hr.  Colleen Can PharmD BCPS 10/05/2011,7:17 AM

## 2011-10-05 NOTE — Progress Notes (Signed)
ANTICOAGULATION CONSULT NOTE - Follow Up Consult  Pharmacy Consult for Coumadin  Indication: atrial fibrillation   Allergies  Allergen Reactions  . Ace Inhibitors     Cough  . Cimetidine     REACTION: unknown reaction, pt states per Dr. Jarold Motto  . Ranitidine Hcl     REACTION: unknown reaction, pt states per Dr. Jarold Motto    Patient Measurements: Height: 5\' 11"  (180.3 cm) Weight: 184 lb 8.4 oz (83.7 kg) (scale (A)) IBW/kg (Calculated) : 75.3   Vital Signs: Temp: 98.5 F (36.9 C) (02/07 0557) Temp src: Oral (02/07 0557) BP: 121/67 mmHg (02/07 0557) Pulse Rate: 74  (02/07 0557)  Labs:  Basename 10/05/11 0625 10/04/11 0530 10/03/11 1549 10/03/11 0615  HGB 13.4 12.9* -- --  HCT 39.3 37.4* -- 38.8*  PLT 141* 138* -- 130*  APTT -- -- -- --  LABPROT 17.9* 16.7* -- 16.9*  INR 1.45 1.33 -- 1.35  HEPARINUNFRC 0.15* 0.64 0.40 --  CREATININE 1.82* 1.81* -- 1.64*  CKTOTAL -- -- -- --  CKMB -- -- -- --  TROPONINI -- -- -- --   Estimated Creatinine Clearance: 32.8 ml/min (by C-G formula based on Cr of 1.82).   Medications:  Scheduled:    . carvedilol  3.125 mg Oral BID  . famotidine  20 mg Oral QHS  . fluticasone  2 puff Inhalation BID  . furosemide  40 mg Intravenous Q12H  . furosemide  40 mg Oral BID  . olmesartan  20 mg Oral Daily  . rosuvastatin  20 mg Oral q1800  . terazosin  5 mg Oral QHS  . warfarin  5 mg Oral ONCE-1800  . DISCONTD: amLODipine  5 mg Oral Daily  . DISCONTD: metoprolol tartrate  12.5 mg Oral QHS  . DISCONTD: metoprolol tartrate  25 mg Oral Daily    Assessment: 78 y/o male patient s/p cath on 2/4, on chronic coumadin for h/o afib. Plan to medically manage CAD, now on heparin bridge to coumadin INR remains subtherapeutic and increasing very slowly.  Home dose: 2.5mg  po daily  Goal of Therapy:  INR 2-3   Plan:  1. Coumadin 7.5mg  po x 1 today 2. F/u INR tomorrow Am 3. Will d/c heparin infusion when INR >2  Riki Rusk 10/05/2011,9:16  AM

## 2011-10-05 NOTE — Progress Notes (Signed)
[facility]  DAILY PROGRESS NOTE   Subjective:  No dyspnea with walking, no orthopnea. No angina. Renal function stable. Weight up with pre-cath hydration, but still lower than admission. INR subtherapeutic.  Objective:  Temp:  [97.3 F (36.3 C)-98.5 F (36.9 C)] 98.5 F (36.9 C) (02/07 0557) Pulse Rate:  [69-76] 74  (02/07 0557) Resp:  [16-22] 22  (02/07 0557) BP: (108-121)/(58-67) 121/67 mmHg (02/07 0557) SpO2:  [96 %-99 %] 98 % (02/07 0557) Weight:  [83.7 kg (184 lb 8.4 oz)] 83.7 kg (184 lb 8.4 oz) (02/07 0557) Weight change: 2 kg (4 lb 6.6 oz)  Intake/Output from previous day: 02/06 0701 - 02/07 0700 In: 690 [P.O.:690] Out: 1825 [Urine:1825]  Intake/Output from this shift: Total I/O In: 360 [P.O.:360] Out: 200 [Urine:200]  Medications: Current Facility-Administered Medications  Medication Dose Route Frequency Provider Last Rate Last Dose  . 0.9 %  sodium chloride infusion  250 mL Intravenous PRN Therisa Doyne, MD   250 mL at 09/30/11 1151  . 0.9 %  sodium chloride infusion   Intravenous Continuous Leone Brand, NP 20 mL/hr at 10/03/11 1543    . acetaminophen (TYLENOL) tablet 650 mg  650 mg Oral Q4H PRN Therisa Doyne, MD      . albuterol (PROVENTIL HFA;VENTOLIN HFA) 108 (90 BASE) MCG/ACT inhaler 2 puff  2 puff Inhalation Q4H PRN Therisa Doyne, MD      . carvedilol (COREG) tablet 3.125 mg  3.125 mg Oral BID Marykay Lex, MD   3.125 mg at 10/04/11 2241  . famotidine (PEPCID) tablet 20 mg  20 mg Oral QHS Therisa Doyne, MD   20 mg at 10/04/11 2240  . fluticasone (FLOVENT HFA) 44 MCG/ACT inhaler 2 puff  2 puff Inhalation BID Therisa Doyne, MD   2 puff at 10/05/11 0810  . furosemide (LASIX) injection 40 mg  40 mg Intravenous Q12H Marykay Lex, MD   40 mg at 10/04/11 1931  . furosemide (LASIX) tablet 40 mg  40 mg Oral BID Marykay Lex, MD      . heparin ADULT infusion 100 units/ml (25000 units/250 ml)  1,650  Units/hr Intravenous Continuous Colleen Can, PHARMD 14 mL/hr at 10/04/11 1125 1,400 Units/hr at 10/04/11 1125  . LORazepam (ATIVAN) tablet 0.5 mg  0.5 mg Oral Q6H PRN Therisa Doyne, MD      . olmesartan (BENICAR) tablet 20 mg  20 mg Oral Daily Therisa Doyne, MD   20 mg at 10/04/11 1038  . ondansetron (ZOFRAN) injection 4 mg  4 mg Intravenous Q6H PRN Therisa Doyne, MD      . rosuvastatin (CRESTOR) tablet 20 mg  20 mg Oral q1800 Therisa Doyne, MD   20 mg at 10/04/11 1930  . terazosin (HYTRIN) capsule 5 mg  5 mg Oral QHS Therisa Doyne, MD   5 mg at 10/04/11 2240  . warfarin (COUMADIN) tablet 5 mg  5 mg Oral ONCE-1800 Kayliee Atienza, MD   5 mg at 10/04/11 1930  . DISCONTD: amLODipine (NORVASC) tablet 5 mg  5 mg Oral Daily Therisa Doyne, MD   5 mg at 10/04/11 1038  . DISCONTD: metoprolol tartrate (LOPRESSOR) tablet 12.5 mg  12.5 mg Oral QHS Hilario Quarry Amend, PHARMD   12.5 mg at 10/03/11 2245  . DISCONTD: metoprolol tartrate (LOPRESSOR) tablet 25 mg  25 mg Oral Daily Hilario Quarry Amend, PHARMD   25 mg at 10/04/11 1039    Physical Exam: General:A&O Pleasant affect.  Skin:W&D brisk  capillary refill  HEENT:normocephalic, sclera clear  Neck: JVP up 3 cm, no HJR; no carotid or subclavian bruits  Heart: irregular, normal S1 and S2, barely audible apical systolic murmur , no diastolic murmur  Lungs:clear, diminished breath sounds throughout  Abd:non tender, not distended, no abnormal pulsatility or bruits  Ext: no edema, thready pedal pulses, 3+ femoral and radial pulses, 2+ femoral bruits  Neuro: A&O X 3, follows commands   Lab Results: Results for orders placed during the hospital encounter of 09/29/11 (from the past 48 hour(s))  HEPARIN LEVEL (UNFRACTIONATED)     Status: Normal   Collection Time   10/03/11  3:49 PM      Component Value Range Comment   Heparin Unfractionated 0.40  0.30 - 0.70 (IU/mL)   BASIC METABOLIC PANEL     Status: Abnormal   Collection  Time   10/04/11  5:30 AM      Component Value Range Comment   Sodium 142  135 - 145 (mEq/L)    Potassium 3.6  3.5 - 5.1 (mEq/L)    Chloride 105  96 - 112 (mEq/L)    CO2 25  19 - 32 (mEq/L)    Glucose, Bld 110 (*) 70 - 99 (mg/dL)    BUN 36 (*) 6 - 23 (mg/dL)    Creatinine, Ser 1.61 (*) 0.50 - 1.35 (mg/dL)    Calcium 9.3  8.4 - 10.5 (mg/dL)    GFR calc non Af Amer 33 (*) >90 (mL/min)    GFR calc Af Amer 38 (*) >90 (mL/min)   HEPARIN LEVEL (UNFRACTIONATED)     Status: Normal   Collection Time   10/04/11  5:30 AM      Component Value Range Comment   Heparin Unfractionated 0.64  0.30 - 0.70 (IU/mL)   CBC     Status: Abnormal   Collection Time   10/04/11  5:30 AM      Component Value Range Comment   WBC 7.6  4.0 - 10.5 (K/uL)    RBC 4.25  4.22 - 5.81 (MIL/uL)    Hemoglobin 12.9 (*) 13.0 - 17.0 (g/dL)    HCT 09.6 (*) 04.5 - 52.0 (%)    MCV 88.0  78.0 - 100.0 (fL)    MCH 30.4  26.0 - 34.0 (pg)    MCHC 34.5  30.0 - 36.0 (g/dL)    RDW 40.9  81.1 - 91.4 (%)    Platelets 138 (*) 150 - 400 (K/uL)   PROTIME-INR     Status: Abnormal   Collection Time   10/04/11  5:30 AM      Component Value Range Comment   Prothrombin Time 16.7 (*) 11.6 - 15.2 (seconds)    INR 1.33  0.00 - 1.49    PRO B NATRIURETIC PEPTIDE     Status: Abnormal   Collection Time   10/04/11  5:30 AM      Component Value Range Comment   Pro B Natriuretic peptide (BNP) 6196.0 (*) 0 - 450 (pg/mL)   BASIC METABOLIC PANEL     Status: Abnormal   Collection Time   10/05/11  6:25 AM      Component Value Range Comment   Sodium 138  135 - 145 (mEq/L)    Potassium 4.0  3.5 - 5.1 (mEq/L)    Chloride 102  96 - 112 (mEq/L)    CO2 23  19 - 32 (mEq/L)    Glucose, Bld 123 (*) 70 - 99 (mg/dL)    BUN 38 (*)  6 - 23 (mg/dL)    Creatinine, Ser 1.61 (*) 0.50 - 1.35 (mg/dL)    Calcium 9.3  8.4 - 10.5 (mg/dL)    GFR calc non Af Amer 33 (*) >90 (mL/min)    GFR calc Af Amer 38 (*) >90 (mL/min)   HEPARIN LEVEL (UNFRACTIONATED)     Status: Abnormal    Collection Time   10/05/11  6:25 AM      Component Value Range Comment   Heparin Unfractionated 0.15 (*) 0.30 - 0.70 (IU/mL)   CBC     Status: Abnormal   Collection Time   10/05/11  6:25 AM      Component Value Range Comment   WBC 7.7  4.0 - 10.5 (K/uL)    RBC 4.44  4.22 - 5.81 (MIL/uL)    Hemoglobin 13.4  13.0 - 17.0 (g/dL)    HCT 09.6  04.5 - 40.9 (%)    MCV 88.5  78.0 - 100.0 (fL)    MCH 30.2  26.0 - 34.0 (pg)    MCHC 34.1  30.0 - 36.0 (g/dL)    RDW 81.1  91.4 - 78.2 (%)    Platelets 141 (*) 150 - 400 (K/uL) PLATELET COUNT CONFIRMED BY SMEAR  PROTIME-INR     Status: Abnormal   Collection Time   10/05/11  6:25 AM      Component Value Range Comment   Prothrombin Time 17.9 (*) 11.6 - 15.2 (seconds)    INR 1.45  0.00 - 1.49    MAGNESIUM     Status: Normal   Collection Time   10/05/11  6:25 AM      Component Value Range Comment   Magnesium 2.2  1.5 - 2.5 (mg/dL)   PRO B NATRIURETIC PEPTIDE     Status: Abnormal   Collection Time   10/05/11  6:25 AM      Component Value Range Comment   Pro B Natriuretic peptide (BNP) 6705.0 (*) 0 - 450 (pg/mL)     Imaging: No results found.  Assessment:  1. Principal Problem: 2.  *Acute systolic congestive heart failure, previous to this admit EF 40% now 20% 3. Active Problems: 4.  HYPERLIPIDEMIA 5.  HYPERTENSION 6.  CORONARY HEART DISEASE, CABG 1999, Cath 01/2003 with patent grafts  7.  Atrial fibrillation, chronic- permanent 8.  Chronic anticoagulation, secondary to Chronic a. fib. 9.  Unstable angina, SOB anginal Equivilant 10.  Ischemic cardiomyopathy, EF 20-25% by 2D this adm 11.  Ventricular tachycardia, non-sustained; secondary to Ischemic CM 12.  Chronic renal disease, stage III 13. Incomplete LBBB  Plan:  1. Resume diuretics - BNP up a little bit; target weight probably best around 82 kg 2. Re-anticoagulate - dc heparin IV when INR >2 3. Discussed role of LifeVest and AICD at length, as length of life and not quality of life  devices, especially considering he is 78 yo and has large aortic aneurysm without plans for repair. He is considering his options. If he decides firmly against AICD, he should not receive Lifevest, either. If he wants AICD or is undecided, LifeVest is indicated until we reevaluate EF in 3 months. 4. He has a marginal indication for BiV pacemaker (CRT-P or CRT-D): QRS duration 118 ms, incomplete LBBB. Best response expected when QRS>150 ms. 5. Plan to gradually increase carvedilol dose over next three months. Since he appears euvolemic and BP acceptable, will increase carvedilol dose starting today.  Time Spent Directly with Patient:  30 minutes  Length of Stay:  LOS: 6 days    [name] [name] 10/05/2011, 9:04 AM

## 2011-10-05 NOTE — Progress Notes (Signed)
ANTICOAGULATION CONSULT NOTE - Follow Up Consult  Pharmacy Consult for Heparin Indication: atrial fibrillation  Labs:  Basename 10/05/11 1429 10/05/11 0625 10/04/11 0530 10/03/11 0615  HGB -- 13.4 12.9* --  HCT -- 39.3 37.4* 38.8*  PLT -- 141* 138* 130*  APTT -- -- -- --  LABPROT -- 17.9* 16.7* 16.9*  INR -- 1.45 1.33 1.35  HEPARINUNFRC 0.61 0.15* 0.64 --  CREATININE -- 1.82* 1.81* 1.64*  CKTOTAL -- -- -- --  CKMB -- -- -- --  TROPONINI -- -- -- --   Assessment: 78yo male on heparin for Afib. The 8 hr heparin level is 0.61 which is back in therapeutic range on 1650 units/hr. No bleeding reported.   Goal of Therapy:  Heparin level 0.3-0.7 units/ml   Plan:  Continue Heparin drip rate at 1650 units/hr.  Check heparin level, CBC and INR daily.   Arman Filter, [facility]  10/05/2011,5:33 PM

## 2011-10-05 NOTE — Progress Notes (Signed)
Pt. Refused Benicar today.Marland KitchenMarland KitchenMarland Kitchen

## 2011-10-06 LAB — BASIC METABOLIC PANEL
BUN: 36 mg/dL — ABNORMAL HIGH (ref 6–23)
CO2: 23 mEq/L (ref 19–32)
Chloride: 103 mEq/L (ref 96–112)
Creatinine, Ser: 1.77 mg/dL — ABNORMAL HIGH (ref 0.50–1.35)
GFR calc Af Amer: 39 mL/min — ABNORMAL LOW (ref >90)

## 2011-10-06 LAB — CBC
HCT: 38.6 % — ABNORMAL LOW (ref 39.0–52.0)
Hemoglobin: 13.2 g/dL (ref 13.0–17.0)
MCH: 30.1 pg (ref 26.0–34.0)
MCHC: 34.2 g/dL (ref 30.0–36.0)

## 2011-10-06 MED ORDER — WARFARIN SODIUM 7.5 MG PO TABS
7.5000 mg | ORAL_TABLET | Freq: Once | ORAL | Status: AC
  Administered 2011-10-06: 7.5 mg via ORAL
  Filled 2011-10-06: qty 1

## 2011-10-06 NOTE — Progress Notes (Signed)
ANTICOAGULATION CONSULT NOTE - Follow Up Consult  Pharmacy Consult for Heparin  Indication: atrial fibrillation  Allergies  Allergen Reactions  . Ace Inhibitors     Cough  . Cimetidine     REACTION: unknown reaction, pt states per Dr. Jarold Motto  . Ranitidine Hcl     REACTION: unknown reaction, pt states per Dr. Jarold Motto    Patient Measurements: Height: 5\' 11"  (180.3 cm) Weight: 181 lb (82.1 kg) (a scale) IBW/kg (Calculated) : 75.3  Heparin Dosing Weight: 82kg  Vital Signs: Temp: 97.4 F (36.3 C) (02/08 1452) Temp src: Oral (02/08 1452) BP: 119/63 mmHg (02/08 1452) Pulse Rate: 72  (02/08 1452)  Labs:  Basename 10/06/11 1733 10/06/11 0603 10/05/11 1429 10/05/11 0625 10/04/11 0530  HGB -- 13.2 -- 13.4 --  HCT -- 38.6* -- 39.3 37.4*  PLT -- 142* -- 141* 138*  APTT -- -- -- -- --  LABPROT -- 21.2* -- 17.9* 16.7*  INR -- 1.80* -- 1.45 1.33  HEPARINUNFRC 0.60 0.75* 0.61 -- --  CREATININE -- 1.77* -- 1.82* 1.81*  CKTOTAL -- -- -- -- --  CKMB -- -- -- -- --  TROPONINI -- -- -- -- --   Estimated Creatinine Clearance: 33.7 ml/min (by C-G formula based on Cr of 1.77).   Medications:  Heparin infusion at 1550 units/hr  Assessment: 78 y/o male patient s/p cath on 2/4, on chronic coumadin for h/o afib. Now on heparin bridge to coumadin. INR remains subtherapeutic but increasing, heparin level at goal  Goal of Therapy:  Heparin level 0.3-0.7 units/ml INR 2-3   Plan:  1. Continue heparin at 1550 units/hr 2. F/u am labs   Talbert Cage Poteet 10/06/2011,6:51 PM

## 2011-10-06 NOTE — Progress Notes (Signed)
Pt. Seen and examined. Agree with the NP/PA-C note as written. Almost net even, however, creatinine is decreasing. Continue lasix 40 mg po BID. Discussed AICD again at length .Marland Kitchen He is almost certainly not a good candidate for re-do surgery or aneurysm repair. He has resigned himself to the fact that if he were to die from aneurysm rupture or SCD due to arrythmia, that is how he wishes to go.  We will not pursue lifevest or AICD.  Continue medical therapy.  Chrystie Nose, MD Attending Cardiologist [facility]

## 2011-10-06 NOTE — Progress Notes (Signed)
ANTICOAGULATION CONSULT NOTE - Follow Up Consult  Pharmacy Consult for Heparin  Indication: atrial fibrillation  Allergies  Allergen Reactions  . Ace Inhibitors     Cough  . Cimetidine     REACTION: unknown reaction, pt states per Dr. Jarold Motto  . Ranitidine Hcl     REACTION: unknown reaction, pt states per Dr. Jarold Motto    Patient Measurements: Height: 5\' 11"  (180.3 cm) Weight: 181 lb (82.1 kg) (a scale) IBW/kg (Calculated) : 75.3  Heparin Dosing Weight: 82kg  Vital Signs: Temp: 97.5 F (36.4 C) (02/08 0529) Temp src: Oral (02/08 0529) BP: 133/76 mmHg (02/08 0529) Pulse Rate: 80  (02/08 0529)  Labs:  Basename 10/06/11 0603 10/05/11 1429 10/05/11 0625 10/04/11 0530  HGB 13.2 -- 13.4 --  HCT 38.6* -- 39.3 37.4*  PLT 142* -- 141* 138*  APTT -- -- -- --  LABPROT 21.2* -- 17.9* 16.7*  INR 1.80* -- 1.45 1.33  HEPARINUNFRC 0.75* 0.61 0.15* --  CREATININE 1.77* -- 1.82* 1.81*  CKTOTAL -- -- -- --  CKMB -- -- -- --  TROPONINI -- -- -- --   Estimated Creatinine Clearance: 33.7 ml/min (by C-G formula based on Cr of 1.77).   Medications:  Heparin infusion at 1650 units/hr  Assessment: 77 y/o male patient s/p cath on 2/4, on chronic coumadin for h/o afib. Now on heparin bridge to coumadin. INR remains subtherapeutic but increasing, heparin level slightly above goal.  Goal of Therapy:  Heparin level 0.3-0.7 units/ml INR 2-3   Plan:  1. Decrease heparin gtt to 1550 units/hr 2. F/u heparin level at 1730 3. Coumadin 7.5mg  po x 1 4. F/u INR tomorrow AM, will d/c heparin if INR >2  Riki Rusk 10/06/2011,9:35 AM

## 2011-10-06 NOTE — Progress Notes (Signed)
[facility]  Subjective: A little short of breath occassionly     Objective: Vital signs in last 24 hours: Temp:  [97.5 F (36.4 C)-98.3 F (36.8 C)] 97.5 F (36.4 C) (02/08 0529) Pulse Rate:  [75-84] 80  (02/08 0529) Resp:  [19-20] 19  (02/08 0529) BP: (111-147)/(62-85) 133/76 mmHg (02/08 0529) SpO2:  [94 %-97 %] 95 % (02/08 0529) Weight:  [82.1 kg (181 lb)] 82.1 kg (181 lb) (02/08 0529) Last BM Date: 10/05/11  Intake/Output from previous day: 02/07 0701 - 02/08 0700 In: 1602 [P.O.:1020; I.V.:582] Out: 1751 [Urine:1750; Stool:1] Intake/Output this shift: Total I/O In: 360 [P.O.:360] Out: 151 [Urine:150; Stool:1]  Medications Current Facility-Administered Medications  Medication Dose Route Frequency Provider Last Rate Last Dose  . 0.9 %  sodium chloride infusion  250 mL Intravenous PRN Therisa Doyne, MD   250 mL at 09/30/11 1151  . 0.9 %  sodium chloride infusion   Intravenous Continuous Leone Brand, NP 20 mL/hr at 10/03/11 1543    . acetaminophen (TYLENOL) tablet 650 mg  650 mg Oral Q4H PRN Therisa Doyne, MD      . albuterol (PROVENTIL HFA;VENTOLIN HFA) 108 (90 BASE) MCG/ACT inhaler 2 puff  2 puff Inhalation Q4H PRN Therisa Doyne, MD      . carvedilol (COREG) tablet 6.25 mg  6.25 mg Oral BID WC Mihai Croitoru, MD   6.25 mg at 10/06/11 9147  . famotidine (PEPCID) tablet 20 mg  20 mg Oral QHS Therisa Doyne, MD   20 mg at 10/05/11 2106  . fluticasone (FLOVENT HFA) 44 MCG/ACT inhaler 2 puff  2 puff Inhalation BID Therisa Doyne, MD   2 puff at 10/05/11 2117  . furosemide (LASIX) tablet 40 mg  40 mg Oral BID Marykay Lex, MD   40 mg at 10/06/11 0846  . heparin ADULT infusion 100 units/ml (25000 units/250 ml)  1,650 Units/hr Intravenous Continuous Mihai Croitoru, MD 16.5 mL/hr at 10/06/11 0614 1,650 Units/hr at 10/06/11 0614  . LORazepam (ATIVAN) tablet 0.5 mg  0.5 mg Oral Q6H PRN Therisa Doyne, MD      . olmesartan  (BENICAR) tablet 20 mg  20 mg Oral Daily Therisa Doyne, MD   20 mg at 10/04/11 1038  . ondansetron (ZOFRAN) injection 4 mg  4 mg Intravenous Q6H PRN Therisa Doyne, MD      . rosuvastatin (CRESTOR) tablet 20 mg  20 mg Oral q1800 Therisa Doyne, MD   20 mg at 10/05/11 1716  . terazosin (HYTRIN) capsule 5 mg  5 mg Oral QHS Therisa Doyne, MD   5 mg at 10/05/11 2106  . warfarin (COUMADIN) tablet 7.5 mg  7.5 mg Oral ONCE-1800 Mihai Croitoru, MD   7.5 mg at 10/05/11 1716  . DISCONTD: carvedilol (COREG) tablet 3.125 mg  3.125 mg Oral BID Marykay Lex, MD   3.125 mg at 10/04/11 2241    PE: General appearance: alert, cooperative and no distress Lungs: clear to auscultation bilaterally Heart: regular rate and rhythm, S1, S2 normal, no murmur, click, rub or gallop Extremities: No LEE Pulses: Radials 2+ and symmetric, 1+ DPs  Lab Results:   Basename 10/06/11 0603 10/05/11 0625 10/04/11 0530  WBC 7.6 7.7 7.6  HGB 13.2 13.4 12.9*  HCT 38.6* 39.3 37.4*  PLT 142* 141* 138*   BMET  Basename 10/06/11 0603 10/05/11 0625 10/04/11 0530  NA 139 138 142  K 3.6 4.0 3.6  CL 103 102 105  CO2 23 23 25  GLUCOSE 125* 123* 110*  BUN 36* 38* 36*  CREATININE 1.77* 1.82* 1.81*  CALCIUM 9.1 9.3 9.3   PT/INR  Basename 10/06/11 0603 10/05/11 0625 10/04/11 0530  LABPROT 21.2* 17.9* 16.7*  INR 1.80* 1.45 1.33      Assessment/Plan   Principal Problem:  *Acute systolic congestive heart failure, previous to this admit EF 40% now 20% Active Problems:  HYPERLIPIDEMIA  HYPERTENSION  CORONARY HEART DISEASE, CABG 1999, Cath 01/2003 with patent grafts   Atrial fibrillation, chronic- permanent  Chronic anticoagulation, secondary to Chronic a. fib.  Unstable angina, SOB anginal Equivilant  Ischemic cardiomyopathy, EF 20-25% by 2D this adm  Ventricular tachycardia, non-sustained; secondary to Ischemic CM  Chronic renal disease, stage III   Plan:  Net I/o:  -149 yesterday. Creatinine  stable.  BP 111/70 - 147/70.  LASix at 40mg  po bid.  Undecided about Technical sales engineer.  Needs to decide today.  INR should be therapeutic by tomorrow.   LOS: 7 days    Mendel Binsfeld W 10/06/2011 9:03 AM \

## 2011-10-06 NOTE — Progress Notes (Signed)
10/06/11 1733 Nursing Note: Cosign on all medications given 7 am-7 pm, and assessment. Shacarra Choe Scientist, clinical (histocompatibility and immunogenetics).

## 2011-10-07 LAB — BASIC METABOLIC PANEL
BUN: 33 mg/dL — ABNORMAL HIGH (ref 6–23)
Chloride: 102 mEq/L (ref 96–112)
Glucose, Bld: 111 mg/dL — ABNORMAL HIGH (ref 70–99)
Potassium: 3.4 mEq/L — ABNORMAL LOW (ref 3.5–5.1)

## 2011-10-07 LAB — PROTIME-INR: Prothrombin Time: 28.1 seconds — ABNORMAL HIGH (ref 11.6–15.2)

## 2011-10-07 LAB — CBC
Hemoglobin: 12.8 g/dL — ABNORMAL LOW (ref 13.0–17.0)
MCH: 29.6 pg (ref 26.0–34.0)
MCV: 87.7 fL (ref 78.0–100.0)
RBC: 4.32 MIL/uL (ref 4.22–5.81)

## 2011-10-07 MED ORDER — POTASSIUM CHLORIDE CRYS ER 20 MEQ PO TBCR
40.0000 meq | EXTENDED_RELEASE_TABLET | Freq: Two times a day (BID) | ORAL | Status: DC
  Administered 2011-10-07 – 2011-10-08 (×3): 40 meq via ORAL
  Filled 2011-10-07 (×4): qty 2

## 2011-10-07 MED ORDER — WARFARIN SODIUM 2.5 MG PO TABS
2.5000 mg | ORAL_TABLET | Freq: Once | ORAL | Status: AC
  Administered 2011-10-07: 2.5 mg via ORAL
  Filled 2011-10-07: qty 1

## 2011-10-07 NOTE — Progress Notes (Signed)
[name]  Subjective: Feels good, no CP or SOB, no dizziness.   Has yet to ambulate in hall - but up to bathroom without assistance & no difficulty.  Objective: Vital signs in last 24 hours: Temp:  [97.4 F (36.3 C)-97.8 F (36.6 C)] 97.4 F (36.3 C) (02/09 0430) Pulse Rate:  [72-81] 74  (02/09 0430) Resp:  [15-18] 16  (02/09 0430) BP: (119-132)/(63-65) 120/64 mmHg (02/09 0430) SpO2:  [94 %-95 %] 94 % (02/09 0430) Weight:  [82.5 kg (181 lb 14.1 oz)] 82.5 kg (181 lb 14.1 oz) (02/09 0430) Last BM Date: 10/06/11  Intake/Output from previous day: 02/08 0701 - 02/09 0700 In: 880.5 [P.O.:600; I.V.:280.5] Out: 1476 [Urine:1475; Stool:1] Intake/Output this shift:    Medications Current Facility-Administered Medications  Medication Dose Route Frequency Provider Last Rate Last Dose  . 0.9 %  sodium chloride infusion  250 mL Intravenous PRN [name] Doyne, MD   250 mL at 09/30/11 1151  . 0.9 %  sodium chloride infusion   Intravenous Continuous [name] Brand, NP 10 mL/hr at 10/07/11 0316 10 mL at 10/07/11 0316  . acetaminophen (TYLENOL) tablet 650 mg  650 mg Oral Q4H PRN [name] Doyne, MD      . albuterol (PROVENTIL HFA;VENTOLIN HFA) 108 (90 BASE) MCG/ACT inhaler 2 puff  2 puff Inhalation Q4H PRN [name] Doyne, MD      . carvedilol (COREG) tablet 6.25 mg  6.25 mg Oral BID WC [name] Croitoru, MD   6.25 mg at 10/07/11 0657  . famotidine (PEPCID) tablet 20 mg  20 mg Oral QHS [name] Doyne, MD   20 mg at 10/06/11 2146  . fluticasone (FLOVENT HFA) 44 MCG/ACT inhaler 2 puff  2 puff Inhalation BID [name] Doyne, MD   2 puff at 10/06/11 2023  . furosemide (LASIX) tablet 40 mg  40 mg Oral BID [name] Lex, MD   40 mg at 10/07/11 0910  . heparin ADULT infusion 100 units/ml (25000 units/250 ml)  1,550 Units/hr Intravenous Continuous [name] Croitoru, MD 15.5 mL/hr at 10/06/11 2145 1,550 Units/hr at 10/06/11 2145  . LORazepam (ATIVAN) tablet  0.5 mg  0.5 mg Oral Q6H PRN [name] Doyne, MD      . olmesartan (BENICAR) tablet 20 mg  20 mg Oral Daily [name] Doyne, MD   20 mg at 10/06/11 1008  . ondansetron (ZOFRAN) injection 4 mg  4 mg Intravenous Q6H PRN [name] Doyne, MD      . potassium chloride SA (K-DUR,KLOR-CON) CR tablet 40 mEq  40 mEq Oral BID [name] Melena, PA      . rosuvastatin (CRESTOR) tablet 20 mg  20 mg Oral q1800 [name] Doyne, MD   20 mg at 10/06/11 1708  . terazosin (HYTRIN) capsule 5 mg  5 mg Oral QHS [name] Doyne, MD   5 mg at 10/06/11 2146  . warfarin (COUMADIN) tablet 7.5 mg  7.5 mg Oral ONCE-1800 [name] Croitoru, MD   7.5 mg at 10/06/11 1709    PE: BP 120/64  Pulse 74  Temp(Src) 97.4 F (36.3 C) (Oral)  Resp 16  Ht 5\' 11"  (1.803 m)  Wt 82.5 kg (181 lb 14.1 oz)  BMI 25.37 kg/m2  SpO2 94% General appearance: alert, cooperative and no distress Neck: no adenopathy, no carotid bruit, no JVD, supple, symmetrical, trachea midline and thyroid not enlarged, symmetric, no tenderness/mass/nodules Lungs: clear to auscultation bilaterally and normal effort => with the exception of fine crackles in RLL Heart: irregularly irregular rhythm  and faint apical systolic murmur; normal S1 S2 Abdomen: soft, non-tender; bowel sounds normal; no masses,  no organomegaly Extremities: extremities normal, atraumatic, no cyanosis or edema Neurologic: Grossly normal   Lab Results:   Basename 10/07/11 0600 10/06/11 0603 10/05/11 0625  WBC 7.6 7.6 7.7  HGB 12.8* 13.2 13.4  HCT 37.9* 38.6* 39.3  PLT 130* 142* 141*   BMET  Basename 10/07/11 0600 10/06/11 0603 10/05/11 0625  NA 137 139 138  K 3.4* 3.6 4.0  CL 102 103 102  CO2 24 23 23   GLUCOSE 111* 125* 123*  BUN 33* 36* 38*  CREATININE 1.71* 1.77* 1.82*  CALCIUM 9.3 9.1 9.3   PT/INR  Basename 10/07/11 0600 10/06/11 0603 10/05/11 0625  LABPROT 28.1* 21.2* 17.9*  INR 2.58* 1.80* 1.45    Assessment/Plan  Principal Problem:  *Acute systolic  congestive heart failure, previous to this admit EF 40% now 20% Active Problems:  HYPERLIPIDEMIA  HYPERTENSION  CORONARY HEART DISEASE, CABG 1999, Cath 01/2003 with patent grafts   Atrial fibrillation, chronic- permanent  Chronic anticoagulation, secondary to Chronic a. fib.  Unstable angina, SOB anginal Equivilant  Ischemic cardiomyopathy, EF 20-25% by 2D this adm  Ventricular tachycardia, non-sustained; secondary to Ischemic CM  Chronic renal disease, stage III  Plan: INR therapeutic for chronic afib.  Patient declines lifevest/AICD.   Repleting K+.  Will dc home on K+.   DC home today with coumadin check early in the next week.  BP/HR stable and controlled.   LOS: 8 days    [name],[name] 10/07/2011 9:45 AM  I have seen and examined the patient along with [name] Finlay, PA.  I have reviewed the chart, notes and new data.  I agree with [name]'s note.  Key new complaints: No complaints -- just that he has not been up & ambulating in hall Key examination changes: (my examination above) Key new findings / data: INR now 2.85,Cr stable & minimally improved, K+ trending down - replete (will need home supplementation)  PLAN: Appears to be nearly euvolemic - on PO diuretic@ ~82.5Kg (close to goal set by Dr. Royann [name]) INR now > 2 (will need to adjust dose due to rapid bump & ensure he has OP INR check early next week - done @ his PCP's office) Please see Dr. Blanchie [name] note yesterday re Life-Vest & ~ICD.  Can re-introduce the idea during f/u with Dr. Royann [name] BP stable - plan will be gradually increasing BB dose over next month in OP f/u.  Otherwise, he is medically ready for discharge - however, he lives alone & we have yet to assess he stability with ambulation.  The family is also attempting to get Life-Alert set up at his home, but they think that he inadvertently hung up on the representative from the company - thinking it was a Designer, multimedia.  Provided he is stable ambulating & life-alert  notified to set up next week, as well as INR check for early next week - he should be fine for discharge.   [name] [name], M.D., M.S. [facility] [address] [place], [place]  [address]  [phone]  10/07/2011 9:57 AM

## 2011-10-07 NOTE — Progress Notes (Signed)
ANTICOAGULATION CONSULT NOTE - Follow Up Consult  Pharmacy Consult for Heparin/coumadin Indication: atrial fibrillation  Assessment: Mr. [name] [name] is an 78 year old male s/p cath on 2/4 on chronic coumadin for h/o afib and on heparin bridge. INR today is 2.58 (goal 2-3). Heparin is also therapeutic at 0.60 (goal 0.3-0.7).  CBC remains stable and no bleeding noted in the chart.   Noted plans to d/c home today to f/u with INR check early next week.  PTA patient on 2.5mg  daily.   Goal of Therapy:  INR 2-3   Plan:  1. Will d/c heparin now that INR >2 2. If patient is not discharged today, give warfarin 2.5mg  po at 1800 3. F/u INR in AM if patient still in hospital  Thank you,  Brett Fairy, PharmD Pager: [phone]  10/07/2011 10:31 AM   Allergies  Allergen Reactions  . Ace Inhibitors     Cough  . Cimetidine     REACTION: unknown reaction, pt states per Dr. Jarold Motto  . Ranitidine Hcl     REACTION: unknown reaction, pt states per Dr. Jarold Motto    Patient Measurements: Height: 5\' 11"  (180.3 cm) Weight: 181 lb 14.1 oz (82.5 kg) (scale) IBW/kg (Calculated) : 75.3   Vital Signs: Temp: 97.4 F (36.3 C) (02/09 0430) BP: 120/64 mmHg (02/09 0430) Pulse Rate: 74  (02/09 0430)  Labs:  Basename 10/07/11 0600 10/06/11 1733 10/06/11 0603 10/05/11 0625  HGB 12.8* -- 13.2 --  HCT 37.9* -- 38.6* 39.3  PLT 130* -- 142* 141*  APTT -- -- -- --  LABPROT 28.1* -- 21.2* 17.9*  INR 2.58* -- 1.80* 1.45  HEPARINUNFRC 0.60 0.60 0.75* --  CREATININE 1.71* -- 1.77* 1.82*  CKTOTAL -- -- -- --  CKMB -- -- -- --  TROPONINI -- -- -- --   Estimated Creatinine Clearance: 34.9 ml/min (by C-G formula based on Cr of 1.71).  Medications:  Scheduled:    . carvedilol  6.25 mg Oral BID WC  . famotidine  20 mg Oral QHS  . fluticasone  2 puff Inhalation BID  . furosemide  40 mg Oral BID  . olmesartan  20 mg Oral Daily  . potassium chloride  40 mEq Oral BID  . rosuvastatin  20 mg Oral q1800  .  terazosin  5 mg Oral QHS  . warfarin  7.5 mg Oral ONCE-1800   Infusions:    . sodium chloride 10 mL (10/07/11 0316)  . heparin 1,550 Units/hr (10/06/11 2145)   PRN: sodium chloride, acetaminophen, albuterol, LORazepam, ondansetron (ZOFRAN) IV   [name] [name] N 10/07/2011,10:26 AM

## 2011-10-07 NOTE — Progress Notes (Signed)
   CARE MANAGEMENT NOTE HEART FAILURE  10/07/2011   Patient:  [name] [name], [name] [name]   Account Number:  [id number]    Date Initiated:  10/02/2011  Documentation initiated by:  Tera Mater  Subjective/Objective Assessment:   78yo male admitted with SOB.  HX: CHF, HTN, AFIB   Action/Plan:   Discharge planning for possible [facility] RN for CHF management   Anticipated DC Date:  10/04/2011  Anticipated DC Plan:  HOME W HOME HEALTH SERVICES  DC Planning Services:  CM consult    [facility] Choice:  HOME HEALTH   Choice offered to / List presented to:  C-1 Patient    HH arranged:  HH-1 RN  HH-10 DISEASE MANAGEMENT     HH agency:  [facility]    Status of service:  In process, will continue to follow  Medicare Important Message Given:  NA - LOS <3 / Initial given by admissions (If response is "NO", the following Medicare IM given date fields will be blank) Date Medicare IM Given:   Date Additional Medicare IM Given:    Discharge Disposition:  HOME W HOME HEALTH SERVICES  Per UR Regulation:  Reviewed for med. necessity/level of care/duration of stay  Comments:   10/07/2011 1500 Spoke to pt and states he lives at home alone. He is able to care for himself. States he has RW and cane at home. Provided pt with brochures for Lifealert, Lifestation, and Lifeline. States they were attempting to set everything up with Life Alert. Pt states he will review brochures and call company to have them set up alert system for him at home. Family states pt is having more periods of difficulty with breathing while lying down. Explained NCM will make MD and his unit RN aware. Pt states he has walked in the halls. States will discuss with unit RN about checking oxygen level while ambulating. Will contact [facility] for [facility] RN. Pt requested [facility] for HH. Will lab draws for Coumadin. Dr. Herbie Baltimore notified of difficulty with breathing at times and NCM arranging [facility] for d/c home. Isidoro Donning RN CCM Case Mgmt phone  [phone]  10/02/11 1600 UR Completed. Tera Mater, RN, BSN

## 2011-10-07 NOTE — Progress Notes (Signed)
1130  Pt ambulated on hallway and back to his room using rolling walker with NT in attendance for supervision . Pt with steady gait . No short of breath

## 2011-10-07 NOTE — Progress Notes (Signed)
1830  02 sat on r/a resting =96%            02 sat on r/a  On ambulating/exertion=94            02 sat on r/a after ambulation and rested =96%

## 2011-10-08 LAB — BASIC METABOLIC PANEL
BUN: 32 mg/dL — ABNORMAL HIGH (ref 6–23)
Chloride: 103 mEq/L (ref 96–112)
GFR calc Af Amer: 42 mL/min — ABNORMAL LOW (ref >90)
Glucose, Bld: 91 mg/dL (ref 70–99)
Potassium: 4.4 mEq/L (ref 3.5–5.1)

## 2011-10-08 MED ORDER — CARVEDILOL 6.25 MG PO TABS: 6.2500 mg | ORAL_TABLET | Freq: Two times a day (BID) | ORAL | Status: DC

## 2011-10-08 MED ORDER — FUROSEMIDE 40 MG PO TABS: 40.0000 mg | ORAL_TABLET | Freq: Two times a day (BID) | ORAL | Status: DC

## 2011-10-08 MED ORDER — WARFARIN SODIUM 2 MG PO TABS
2.0000 mg | ORAL_TABLET | Freq: Once | ORAL | Status: DC
  Filled 2011-10-08: qty 1

## 2011-10-08 MED ORDER — POTASSIUM CHLORIDE CRYS ER 20 MEQ PO TBCR: 20.0000 meq | EXTENDED_RELEASE_TABLET | Freq: Two times a day (BID) | ORAL | Status: DC

## 2011-10-08 MED ORDER — OLMESARTAN MEDOXOMIL 20 MG PO TABS: 20.0000 mg | ORAL_TABLET | Freq: Every day | ORAL | Status: DC

## 2011-10-08 NOTE — Progress Notes (Signed)
2/10/113 1030 Nursing Note: Pt to be discharged per MD's order. Pt's IV and cardiac monitor discontinued per MD's order. Pt received all discharge instructions and verbalized understanding. Will  Escort pt to car safely upon discharge. [name] [name] Scientist, clinical (histocompatibility and immunogenetics).

## 2011-10-08 NOTE — Progress Notes (Signed)
[facility]  Subjective: No Complaints. Just frustrated that he stayed additional day. Walked in hall yesterday.  Objective: Vital signs in last 24 hours: Temp:  [97.8 F (36.6 C)-97.9 F (36.6 C)] 97.8 F (36.6 C) (02/10 0429) Pulse Rate:  [65-78] 74  (02/10 0429) Resp:  [16-20] 18  (02/10 0429) BP: (114-121)/(61-65) 114/65 mmHg (02/10 0429) SpO2:  [91 %-95 %] 91 % (02/10 0429) Weight:  [81.9 kg (180 lb 8.9 oz)] 81.9 kg (180 lb 8.9 oz) (02/10 0500) Last BM Date: 10/06/11  Intake/Output from previous day: 02/09 0701 - 02/10 0700 In: 450 [P.O.:450] Out: 1000 [Urine:1000] Intake/Output this shift: Total I/O In: 240 [P.O.:240] Out: 450 [Urine:450]  Medications Current Facility-Administered Medications  Medication Dose Route Frequency Provider Last Rate Last Dose  . 0.9 %  sodium chloride infusion  250 mL Intravenous PRN Therisa Doyne, MD   250 mL at 09/30/11 1151  . 0.9 %  sodium chloride infusion   Intravenous Continuous Leone Brand, NP 10 mL/hr at 10/07/11 0316 10 mL at 10/07/11 0316  . acetaminophen (TYLENOL) tablet 650 mg  650 mg Oral Q4H PRN Therisa Doyne, MD      . albuterol (PROVENTIL HFA;VENTOLIN HFA) 108 (90 BASE) MCG/ACT inhaler 2 puff  2 puff Inhalation Q4H PRN Therisa Doyne, MD      . carvedilol (COREG) tablet 6.25 mg  6.25 mg Oral BID WC Mihai Croitoru, MD   6.25 mg at 10/08/11 0627  . famotidine (PEPCID) tablet 20 mg  20 mg Oral QHS Therisa Doyne, MD   20 mg at 10/07/11 2141  . fluticasone (FLOVENT HFA) 44 MCG/ACT inhaler 2 puff  2 puff Inhalation BID Therisa Doyne, MD   2 puff at 10/07/11 2121  . furosemide (LASIX) tablet 40 mg  40 mg Oral BID Marykay Lex, MD   40 mg at 10/07/11 1731  . LORazepam (ATIVAN) tablet 0.5 mg  0.5 mg Oral Q6H PRN Therisa Doyne, MD      . olmesartan (BENICAR) tablet 20 mg  20 mg Oral Daily Therisa Doyne, MD   20 mg at 10/07/11 1051  . ondansetron (ZOFRAN) injection  4 mg  4 mg Intravenous Q6H PRN Therisa Doyne, MD      . potassium chloride SA (K-DUR,KLOR-CON) CR tablet 40 mEq  40 mEq Oral BID Dwana Melena, PA   40 mEq at 10/07/11 2142  . rosuvastatin (CRESTOR) tablet 20 mg  20 mg Oral q1800 Therisa Doyne, MD   20 mg at 10/07/11 1731  . terazosin (HYTRIN) capsule 5 mg  5 mg Oral QHS Therisa Doyne, MD   5 mg at 10/07/11 2142  . warfarin (COUMADIN) tablet 2 mg  2 mg Oral ONCE-1800 Mihai Croitoru, MD      . warfarin (COUMADIN) tablet 2.5 mg  2.5 mg Oral ONCE-1800 Mihai Croitoru, MD   2.5 mg at 10/07/11 1729  . DISCONTD: heparin ADULT infusion 100 units/ml (25000 units/250 ml)  1,550 Units/hr Intravenous Continuous Mihai Croitoru, MD   1,550 Units/hr at 10/06/11 2145    PE: General appearance: alert, cooperative and no distress Lungs: clear to auscultation bilaterally Heart: irregularly irregular rhythm Extremities: No LEE Pulses: 2+ and symmetric  Lab Results:   Basename 10/07/11 0600 10/06/11 0603  WBC 7.6 7.6  HGB 12.8* 13.2  HCT 37.9* 38.6*  PLT 130* 142*   BMET  Basename 10/08/11 0510 10/07/11 0600 10/06/11 0603  NA 138 137 139  K 4.4 3.4* 3.6  CL  103 102 103  CO2 22 24 23   GLUCOSE 91 111* 125*  BUN 32* 33* 36*  CREATININE 1.68* 1.71* 1.77*  CALCIUM 9.2 9.3 9.1   PT/INR  Basename 10/08/11 0510 10/07/11 0600 10/06/11 0603  LABPROT 30.1* 28.1* 21.2*  INR 2.82* 2.58* 1.80*    Assessment/Plan  Principal Problem:  *Acute systolic congestive heart failure, previous to this admit EF 40% now 20% Active Problems:  HYPERLIPIDEMIA  HYPERTENSION  CORONARY HEART DISEASE, CABG 1999, Cath 01/2003 with patent grafts   Atrial fibrillation, chronic- permanent  Chronic anticoagulation, secondary to Chronic a. fib.  Unstable angina, SOB anginal Equivilant  Ischemic cardiomyopathy, EF 20-25% by 2D this adm  Ventricular tachycardia, non-sustained; secondary to Ischemic CM  Chronic renal disease, stage III  Plan:  INR  therapeutic for chronic afib. Patient declines lifevest/AICD.  K+ Repleted. Will dc home on K+. DC home today .  He will get his coumadin check at his usual clinic on Tuesday. BP/HR stable and controlled.   LOS: 9 days    [name],[name] 10/08/2011 10:04 AM  I have seen & examined the patient this AM along with Mr. [name] [name], [place].  I have reviewed the chart. He looks even better today than yesterday == walked without difficulty & no O2 requirement. CSW very helpful in assisting with Life-Alert & with setting up short term Home Health RN/PT. INR check @ PCP.  He is ready for discharge today.  Marykay Lex, M.D., M.S. [facility] [address] [place], [place]  [address]  [phone]  10/08/2011 10:28 AM

## 2011-10-08 NOTE — Progress Notes (Signed)
ANTICOAGULATION CONSULT NOTE - Follow Up Consult  Pharmacy Consult for Coumadin Indication: atrial fibrillation  Assessment: [name] [name] is an 78 year old male s/p cath on 2/4 now off heparin bridge to coumadin. INR today is therapeutic at 2.82. Patient received 7.5mg  x 3 days to reach therapeutic range so will likely see gradual increase of INR. No bleeding noted in chart.   PTA patient on 2.5 mg daily. Noted plans for d/c home soon with f/u INR check.   Goal of Therapy:  INR 2-3   Plan:  1. Coumadin 2mg  po x 1 at 1800 tonight 2. It is ok for the patient to be d/c on home dose regimen of 2.5 mg daily with f/u INR check 3. F/u INR in AM if pt still in hospital  Thank you,  Brett Fairy, PharmD Pager: [phone]  10/08/2011 9:55 AM   Allergies  Allergen Reactions  . Ace Inhibitors     Cough  . Cimetidine     REACTION: unknown reaction, pt states per Dr. Jarold Motto  . Ranitidine Hcl     REACTION: unknown reaction, pt states per Dr. Jarold Motto    Patient Measurements: Height: 5\' 11"  (180.3 cm) Weight: 180 lb 8.9 oz (81.9 kg) IBW/kg (Calculated) : 75.3   Vital Signs: Temp: 97.8 F (36.6 C) (02/10 0429) Temp src: Oral (02/10 0429) BP: 114/65 mmHg (02/10 0429) Pulse Rate: 74  (02/10 0429)  Labs:  Basename 10/08/11 0510 10/07/11 0600 10/06/11 1733 10/06/11 0603  HGB -- 12.8* -- 13.2  HCT -- 37.9* -- 38.6*  PLT -- 130* -- 142*  APTT -- -- -- --  LABPROT 30.1* 28.1* -- 21.2*  INR 2.82* 2.58* -- 1.80*  HEPARINUNFRC -- 0.60 0.60 0.75*  CREATININE 1.68* 1.71* -- 1.77*  CKTOTAL -- -- -- --  CKMB -- -- -- --  TROPONINI -- -- -- --   Estimated Creatinine Clearance: 35.5 ml/min (by C-G formula based on Cr of 1.68).  Medications:  Scheduled:    . carvedilol  6.25 mg Oral BID WC  . famotidine  20 mg Oral QHS  . fluticasone  2 puff Inhalation BID  . furosemide  40 mg Oral BID  . olmesartan  20 mg Oral Daily  . potassium chloride  40 mEq Oral BID  . rosuvastatin  20 mg  Oral q1800  . terazosin  5 mg Oral QHS  . warfarin  2.5 mg Oral ONCE-1800   Infusions:    . sodium chloride 10 mL (10/07/11 0316)  . DISCONTD: heparin Stopped (10/07/11 1030)   PRN: sodium chloride, acetaminophen, albuterol, LORazepam, ondansetron (ZOFRAN) IV   [name] [name] N 10/08/2011,9:51 AM

## 2011-10-20 NOTE — Discharge Summary (Signed)
Physician Discharge Summary  Patient ID: [name] [name] MRN: [id number] DOB/AGE: [id number] 76 y.o.  Admit date: 09/29/2011 Discharge date: 10/20/2011  Admission Diagnoses: SOB  Discharge Diagnoses:  Principal Problem:  *Acute systolic congestive heart failure, previous to this admit EF 40% now 20% Active Problems:  HYPERLIPIDEMIA  HYPERTENSION  CORONARY HEART DISEASE, CABG 1999, Cath 01/2003 with patent grafts   Atrial fibrillation, chronic- permanent  Chronic anticoagulation, secondary to Chronic a. fib.  Unstable angina, SOB anginal Equivilant  Ischemic cardiomyopathy, EF 20-25% by 2D this adm  Ventricular tachycardia, non-sustained; secondary to Ischemic CM  Chronic renal disease, stage III   Discharged Condition: stable  Hospital Course:  [name] [name] is a 78 y.o. 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  Presented with one day history of worsening shortness of breath.  09/29/11 his chronic shortness of breath became worse, he called Dr. Lynelle Doctor office (pulmonology) and was told to be evaluated in ER. He reports no chest pain, no cough, no wheezing.  He developed severe dyspnea on exertion that is worse than his base line on 09/29/11. Taking extra dose of lasix seemed to help. He has a history of respiratory distress (ARDS) after his CABG in 1996 requiring tracheostomy. He has COPD also that is progressive. He stated that over several months he had slow increase of SOB until yesterday. While lying flat at night he became SOB and had to sit up.  Pro-BNP was 8,388 on admit. 2 view CXR--- Increased small right pleural effusion. Interstitial accentuation which  may represent mild pulmonary edema superimposed on chronic lung disease.  Troponin I is 0.60 to 0.61, Cr. 1.60 with BUN of 33.   The pt was given 40mg  IV lasix Q12hr. INR was therapeutic on admission.  2D echo was completed(see details below)  EF 20-25% which was reduced from prior echo.  Coumadin was held and IV heparin started.  Pt was taken for right and left heart cath on 10/02/11(see below).  BNP decreased to 6196.  Switched to PO lasix.  Life vest was dicussed at length several times and the patient ultimately declined.  IV NTG was DCd and Imdur started.  Amlodipine started and lopressor changed to coreg.  Admission wt 88.27. DC wt 81.9Kg.  Cardiac rehad worked with Pt.  Coumadin restarted.  He has a marginal indication for BiV pacemaker (CRT-P or CRT-D): QRS duration 118 ms, incomplete LBBB. Best response expected when QRS>150 ms.  Plan to gradually increase Coreg. He is medically ready for discharge wih a therapeutic INR.  He lives alone and the family is also attempting to get Life-Alert set up at his home.    Significant Diagnostic Studies:  10/02/11, Right/Left heart cath, DICTATION # [id number], [id number]  RA 6  RV 52/6  PA 52/2PC 17  Ao  114/60  LV 114/16  Severe native 4v CAD  Patent LIMA to mid LAD  Patent SVG to DX1 but occluded sequential limb to Ramus  SVG to RCA occluded  Patent initial limb of SVG to PDA, but occluded sequential limbs to PLA1 and PLA2.  Medical therapy. Resume coumadin.   2D Echo Study Conclusions  - Left ventricle: The cavity size was normal. There was mild concentric hypertrophy. Systolic function was  severely reduced. The estimated ejection fraction was in the range of 20% to 25%. Akinesis and scarring of the mid-distalanteroseptal myocardium; consistent with infarction in the distribution of the left anterior descending coronary artery. Severe hypokinesis of the entireanterior, anterolateral, lateral, and apical myocardium; consistent with ischemia or  infarction in the distribution of the left anterior descending and left circumflex coronary artery. Features are consistent with a pseudonormal left ventricular filling pattern, with concomitant abnormal relaxation and increased filling pressure (grade 2 diastolic dysfunction). Doppler parameters are consistent with elevated mean left atrial filling pressure. - Aortic valve: Mild to moderate regurgitation directed centrally in the LVOT. - Ascending aorta: The ascending aorta was severely dilated. - Mitral valve: Calcified annulus. Mild regurgitation directed centrally. - Left atrium: The atrium was moderately to severely dilated. - Right ventricle: Systolic function was moderately reduced. - Atrial septum: No defect or patent foramen ovale was identified.  CHEST - 2 VIEW  Comparison: 11/12/2010 and a CT scan dated 03/16/2011  Findings: There is a small but increased right pleural effusion.  There is cardiomegaly. Interstitial markings are diffusely more  accentuated than on the prior study. This is superimposed on  chronic lung disease. Old well healed fractures of two right  posterior ribs. Evidence of prior CABG.  IMPRESSION:  Increased small right pleural effusion. Interstitial accentuation  which may represent mild pulmonary edema superimposed on chronic  lung disease.  Original Report Authenticated By: Gwynn Burly, M.D.  CBC    Component Value Date/Time   WBC 7.6 10/07/2011 0600   RBC 4.32 10/07/2011 0600   HGB 12.8* 10/07/2011 0600   HCT 37.9* 10/07/2011 0600   PLT 130* 10/07/2011 0600   MCV 87.7 10/07/2011 0600   MCH 29.6 10/07/2011 0600   MCHC 33.8 10/07/2011 0600   RDW 12.8 10/07/2011 0600   LYMPHSABS 2.3 09/30/2011 0135   MONOABS 0.9 09/30/2011 0135   EOSABS 0.2 09/30/2011 0135   BASOSABS 0.0 09/30/2011 0135    BMET    Component Value Date/Time   NA 138 10/08/2011 0510   K 4.4 10/08/2011 0510   CL 103 10/08/2011 0510   CO2 22 10/08/2011 0510   GLUCOSE 91 10/08/2011 0510    BUN 32* 10/08/2011 0510   CREATININE 1.68* 10/08/2011 0510   CALCIUM 9.2 10/08/2011 0510   GFRNONAA 36* 10/08/2011 0510   GFRAA 42* 10/08/2011 0510   BNP    Component Value Date/Time   PROBNP 6705.0* 10/05/2011 0625     Discharge Exam: Blood pressure 114/65, pulse 74, temperature 97.8 F (36.6 C), temperature source Oral, resp. rate 18, height 5\' 11"  (1.803 m), weight 81.9 kg (180 lb 8.9 oz), SpO2 91.00%.   Disposition: Home-Health Care Svc  Discharge Orders    Future Orders Please Complete By Expires   Diet - low sodium heart healthy      Increase activity slowly        Medication List  As of 10/20/2011  1:14 PM   STOP taking these medications         amLODipine 5 MG tablet      metoprolol tartrate 25 MG tablet      omeprazole 40 MG capsule         TAKE these medications         albuterol 108 (90 BASE) MCG/ACT inhaler   Commonly known as: PROVENTIL HFA;VENTOLIN HFA   Inhale 2 puffs into the lungs every 4 (four) hours as needed. For shortness of breath  budesonide 180 MCG/ACT inhaler   Commonly known as: PULMICORT   Inhale 2 puffs into the lungs 2 (two) times daily.      carvedilol 6.25 MG tablet   Commonly known as: COREG   Take 1 tablet (6.25 mg total) by mouth 2 (two) times daily with a meal.      famotidine 20 MG tablet   Commonly known as: PEPCID   Take 20 mg by mouth at bedtime. One at bedtime      furosemide 40 MG tablet   Commonly known as: LASIX   Take 1 tablet (40 mg total) by mouth 2 (two) times daily.      LORazepam 0.5 MG tablet   Commonly known as: ATIVAN   Take 0.5 mg by mouth every 6 (six) hours as needed. For anxiety      olmesartan 20 MG tablet   Commonly known as: BENICAR   Take 1 tablet (20 mg total) by mouth daily.      potassium chloride SA 20 MEQ tablet   Commonly known as: K-DUR,KLOR-CON   Take 1 tablet (20 mEq total) by mouth 2 (two) times daily.      simvastatin 80 MG tablet   Commonly known as: ZOCOR   Take 80 mg by mouth  at bedtime.      terazosin 5 MG capsule   Commonly known as: HYTRIN   Take 5 mg by mouth at bedtime.      Vitamin D3 1000 UNITS Caps   Take 2 capsules by mouth daily.      warfarin 5 MG tablet   Commonly known as: COUMADIN   Take 2.5 mg by mouth daily.           Follow-up Information    Follow up with [name], MD.      Follow up with [facility]. [facility] RN , labs draws for Coumadin)    Contact information:   [phone]         Signed: Dwana Melena 10/20/2011, 1:14 PM  I saw the patient on the day of discharge - he was ready for discharge. He has ambulated without assistance, and is in the process of setting up Life-Alert. I agree with Mr. Jasper Riling summary.  Marykay Lex, M.D., M.S. [facility] [address] [place], [place]  [address]  [phone]  10/20/2011 9:23 PM

## 2011-12-09 ENCOUNTER — Emergency Department (HOSPITAL_COMMUNITY): Payer: Medicare Other

## 2011-12-09 ENCOUNTER — Encounter (HOSPITAL_COMMUNITY): Payer: Self-pay

## 2011-12-09 ENCOUNTER — Emergency Department (HOSPITAL_COMMUNITY)
Admission: EM | Admit: 2011-12-09 | Discharge: 2011-12-09 | Disposition: A | Discharge status: Home / Self Care | Payer: Medicare Other | Attending: Emergency Medicine | Admitting: Emergency Medicine

## 2011-12-09 DIAGNOSIS — R5381 Other malaise: Secondary | ICD-10-CM | POA: Insufficient documentation

## 2011-12-09 DIAGNOSIS — I472 Ventricular tachycardia: Secondary | ICD-10-CM | POA: Diagnosis not present

## 2011-12-09 DIAGNOSIS — E1122 Type 2 diabetes mellitus with diabetic chronic kidney disease: Secondary | ICD-10-CM | POA: Diagnosis present

## 2011-12-09 DIAGNOSIS — J441 Chronic obstructive pulmonary disease with (acute) exacerbation: Secondary | ICD-10-CM | POA: Insufficient documentation

## 2011-12-09 DIAGNOSIS — J449 Chronic obstructive pulmonary disease, unspecified: Secondary | ICD-10-CM | POA: Diagnosis present

## 2011-12-09 DIAGNOSIS — J189 Pneumonia, unspecified organism: Secondary | ICD-10-CM | POA: Insufficient documentation

## 2011-12-09 DIAGNOSIS — I252 Old myocardial infarction: Secondary | ICD-10-CM | POA: Insufficient documentation

## 2011-12-09 DIAGNOSIS — R5383 Other fatigue: Secondary | ICD-10-CM | POA: Insufficient documentation

## 2011-12-09 DIAGNOSIS — I714 Abdominal aortic aneurysm, without rupture: Secondary | ICD-10-CM | POA: Diagnosis present

## 2011-12-09 DIAGNOSIS — I2510 Atherosclerotic heart disease of native coronary artery without angina pectoris: Secondary | ICD-10-CM | POA: Insufficient documentation

## 2011-12-09 DIAGNOSIS — I10 Essential (primary) hypertension: Secondary | ICD-10-CM | POA: Insufficient documentation

## 2011-12-09 DIAGNOSIS — I255 Ischemic cardiomyopathy: Secondary | ICD-10-CM | POA: Diagnosis present

## 2011-12-09 DIAGNOSIS — E785 Hyperlipidemia, unspecified: Secondary | ICD-10-CM | POA: Insufficient documentation

## 2011-12-09 DIAGNOSIS — K219 Gastro-esophageal reflux disease without esophagitis: Secondary | ICD-10-CM | POA: Insufficient documentation

## 2011-12-09 DIAGNOSIS — Z7901 Long term (current) use of anticoagulants: Secondary | ICD-10-CM

## 2011-12-09 DIAGNOSIS — Z79899 Other long term (current) drug therapy: Secondary | ICD-10-CM | POA: Insufficient documentation

## 2011-12-09 DIAGNOSIS — R9389 Abnormal findings on diagnostic imaging of other specified body structures: Secondary | ICD-10-CM | POA: Diagnosis present

## 2011-12-09 DIAGNOSIS — R0602 Shortness of breath: Secondary | ICD-10-CM | POA: Insufficient documentation

## 2011-12-09 DIAGNOSIS — I4891 Unspecified atrial fibrillation: Secondary | ICD-10-CM | POA: Insufficient documentation

## 2011-12-09 LAB — APTT: aPTT: 36 seconds (ref 24–37)

## 2011-12-09 LAB — CBC
Platelets: ADEQUATE 10*3/uL (ref 150–400)
RDW: 13.9 % (ref 11.5–15.5)
WBC: 6.5 10*3/uL (ref 4.0–10.5)

## 2011-12-09 LAB — POCT I-STAT, CHEM 8
Calcium, Ion: 1.12 mmol/L (ref 1.12–1.32)
HCT: 40 % (ref 39.0–52.0)
Hemoglobin: 13.6 g/dL (ref 13.0–17.0)
TCO2: 25 mmol/L (ref 0–100)

## 2011-12-09 LAB — POCT I-STAT TROPONIN I: Troponin i, poc: 0.03 ng/mL (ref 0.00–0.08)

## 2011-12-09 LAB — PROTIME-INR: INR: 2.02 — ABNORMAL HIGH (ref 0.00–1.49)

## 2011-12-09 MED ORDER — MOXIFLOXACIN HCL 400 MG PO TABS: 400.0000 mg | ORAL_TABLET | Freq: Every day | ORAL | Status: AC

## 2011-12-09 MED ORDER — PREDNISONE 20 MG PO TABS
60.0000 mg | ORAL_TABLET | Freq: Once | ORAL | Status: AC
  Administered 2011-12-09: 60 mg via ORAL
  Filled 2011-12-09: qty 3

## 2011-12-09 MED ORDER — PREDNISONE 10 MG PO TABS: ORAL_TABLET | ORAL | Status: DC

## 2011-12-09 MED ORDER — ASPIRIN 81 MG PO CHEW
324.0000 mg | CHEWABLE_TABLET | Freq: Once | ORAL | Status: AC
  Administered 2011-12-09: 324 mg via ORAL
  Filled 2011-12-09: qty 4

## 2011-12-09 MED ORDER — ALBUTEROL SULFATE (5 MG/ML) 0.5% IN NEBU
5.0000 mg | INHALATION_SOLUTION | Freq: Once | RESPIRATORY_TRACT | Status: AC
  Administered 2011-12-09: 5 mg via RESPIRATORY_TRACT
  Filled 2011-12-09: qty 1

## 2011-12-09 MED ORDER — MOXIFLOXACIN HCL 400 MG PO TABS
400.0000 mg | ORAL_TABLET | Freq: Once | ORAL | Status: AC
  Administered 2011-12-09: 400 mg via ORAL
  Filled 2011-12-09: qty 1

## 2011-12-09 MED ORDER — IPRATROPIUM BROMIDE 0.02 % IN SOLN
0.5000 mg | Freq: Once | RESPIRATORY_TRACT | Status: AC
  Administered 2011-12-09: 0.5 mg via RESPIRATORY_TRACT
  Filled 2011-12-09: qty 2.5

## 2011-12-09 NOTE — ED Notes (Signed)
Pt c/o hx of SOB and CHF, pt reports sob became progressively worse yesterday am. Pt called his pcp they instructed pt to take another 1/2 dose of Lasix, pt reports taking the Lasix w/no change, pt denies chest/abd/back pain, N/V, or diaphoresis. Pt also reports weakness for several weeks, pt was admitted to our facility for CHF exacerbation.

## 2011-12-09 NOTE — Discharge Instructions (Signed)
Chronic Obstructive Pulmonary Disease  Chronic obstructive pulmonary disease (COPD) is a condition in which airflow from the lungs is restricted. The lungs can never return to normal, but there are measures you can take which will improve them and make you feel better.  CAUSES    Smoking.   Exposure to secondhand smoke.   Breathing in irritants (pollution, cigarette smoke, strong smells, aerosol sprays, paint fumes).   History of lung infections.  TREATMENT   Treatment focuses on making you comfortable (supportive care). Your caregiver may prescribe medications (inhaled or pills) to help improve your breathing.  HOME CARE INSTRUCTIONS    If you smoke, stop smoking.   Avoid exposure to smoke, chemicals, and fumes that aggravate your breathing.   Take antibiotic medicines as directed by your caregiver.   Avoid medicines that dry up your system and slow down the elimination of secretions (antihistamines and cough syrups). This decreases respiratory capacity and may lead to infections.   Drink enough water and fluids to keep your urine clear or pale yellow. This loosens secretions.   Use humidifiers at home and at your bedside if they do not make breathing difficult.   Receive all protective vaccines your caregiver suggests, especially pneumococcal and influenza.   Use home oxygen as suggested.   Stay active. Exercise and physical activity will help maintain your ability to do things you want to do.   Eat a healthy diet.  SEEK MEDICAL CARE IF:    You develop pus-like mucus (sputum).   Breathing is more labored or exercise becomes difficult to do.   You are running out of the medicine you take for your breathing.  SEEK IMMEDIATE MEDICAL CARE IF:    You have a rapid heart rate.   You have agitation, confusion, tremors, or are in a stupor (family members may need to observe this).   It becomes difficult to breathe.   You develop chest pain.   You have a fever.  MAKE SURE YOU:    Understand these  instructions.   Will watch your condition.   Will get help right away if you are not doing well or get worse.  Document Released: 05/24/2005 Document Revised: 08/03/2011 Document Reviewed: 10/14/2010  Exit[employer] Patient Information 2012 [employer]  Pneumonia, Adult  Pneumonia is an infection of the lungs.   CAUSES  Pneumonia may be caused by bacteria or a virus. Usually, these infections are caused by breathing infectious particles into the lungs (respiratory tract).  SYMPTOMS    Cough.   Fever.   Chest pain.   Increased rate of breathing.   Wheezing.   Mucus production.  DIAGNOSIS   If you have the common symptoms of pneumonia, your caregiver will typically confirm the diagnosis with a chest X-ray. The X-ray will show an abnormality in the lung (pulmonary infiltrate) if you have pneumonia. Other tests of your blood, urine, or sputum may be done to find the specific cause of your pneumonia. Your caregiver may also do tests (blood gases or pulse oximetry) to see how well your lungs are working.  TREATMENT   Some forms of pneumonia may be spread to other people when you cough or sneeze. You may be asked to wear a mask before and during your exam. Pneumonia that is caused by bacteria is treated with antibiotic medicine. Pneumonia that is caused by the influenza virus may be treated with an antiviral medicine. Most other viral infections must run their course. These infections will not respond to   antibiotics.   PREVENTION  A pneumococcal shot (vaccine) is available to prevent a common bacterial cause of pneumonia. This is usually suggested for:   People over 65 years old.   Patients on chemotherapy.   People with chronic lung problems, such as bronchitis or emphysema.   People with immune system problems.  If you are over 65 or have a high risk condition, you may receive the pneumococcal vaccine if you have not received it before. In some countries, a routine influenza vaccine is also recommended. This  vaccine can help prevent some cases of pneumonia.You may be offered the influenza vaccine as part of your care.  If you smoke, it is time to quit. You may receive instructions on how to stop smoking. Your caregiver can provide medicines and counseling to help you quit.  HOME CARE INSTRUCTIONS    Cough suppressants may be used if you are losing too much rest. However, coughing protects you by clearing your lungs. You should avoid using cough suppressants if you can.   Your caregiver may have prescribed medicine if he or she thinks your pneumonia is caused by a bacteria or influenza. Finish your medicine even if you start to feel better.   Your caregiver may also prescribe an expectorant. This loosens the mucus to be coughed up.   Only take over-the-counter or prescription medicines for pain, discomfort, or fever as directed by your caregiver.   Do not smoke. Smoking is a common cause of bronchitis and can contribute to pneumonia. If you are a smoker and continue to smoke, your cough may last several weeks after your pneumonia has cleared.   A cold steam vaporizer or humidifier in your room or home may help loosen mucus.   Coughing is often worse at night. Sleeping in a semi-upright position in a recliner or using a couple pillows under your head will help with this.   Get rest as you feel it is needed. Your body will usually let you know when you need to rest.  SEEK IMMEDIATE MEDICAL CARE IF:    Your illness becomes worse. This is especially true if you are elderly or weakened from any other disease.   You cannot control your cough with suppressants and are losing sleep.   You begin coughing up blood.   You develop pain which is getting worse or is uncontrolled with medicines.   You have a fever.   Any of the symptoms which initially brought you in for treatment are getting worse rather than better.   You develop shortness of breath or chest pain.  MAKE SURE YOU:    Understand these  instructions.   Will watch your condition.   Will get help right away if you are not doing well or get worse.  Document Released: 08/14/2005 Document Revised: 08/03/2011 Document Reviewed: 11/03/2010  [employer] Patient Information 2012 [employer]

## 2011-12-09 NOTE — ED Provider Notes (Signed)
History     CSN: [id number]  Arrival date & time 12/09/11  1033   First MD Initiated Contact with Patient 12/09/11 1043      Chief Complaint  Patient presents with  . Shortness of Breath    (Consider location/radiation/quality/duration/timing/severity/associated sxs/prior treatment) HPI The patient is an 78 yo male with a history of severe CAD who presents today complaining of shortness of breath that began yesterday and has persisted.  He also has a history of COPD.  The patient denies any cough or fever.  He has no history of cancer, PE, or DVT.  He reports that with his heart attack he had no chest pain.  Patient's last cath was 2 months ago and at that time he was told that there was nothing else to be done.  Patient's symptoms are worse with exertion.  There are no other associated or modifying factors.  Past Medical History  Diagnosis Date  . HTN (hypertension)   . GERD (gastroesophageal reflux disease)   . Hyperlipidemia   . Hypertensive cardiovascular disease   . CAD (coronary artery disease)   . MI (myocardial infarction) 1989  . Atrial fibrillation 1/09    s/p DCCV to NSR   . Pulmonary granuloma     left  . Ascending aortic aneurysm   . COPD (chronic obstructive pulmonary disease)   . Shortness of breath   . CAD (coronary artery disease) of bypass graft,SVG to RCA occluded, Seq. limb of SVG to PLA1 &PLA2 occluded, Seq. VG to Ramus occluded, PATENT LIMA to LAD, Patent VG to DX1, Patent Vg to PDA 10/02/11 10/02/2011  . Ischemic cardiomyopathy 10/02/2011    Past Surgical History  Procedure Date  . Coronary artery bypass graft 1996  . Stomach surgery   . Mastoidectomy     right ear  . Cardiac catheterization     Family History  Problem Relation Age of Onset  . Liver cancer Sister   . Hypertension Sister   . Hypertension Father     History  Substance Use Topics  . Smoking status: Former Smoker -- 0.5 packs/day for 45 years    Types: Cigarettes, Cigars    Quit  date: 12/26/1987  . Smokeless tobacco: Never Used  . Alcohol Use: Yes     Occas Beer      Review of Systems  Constitutional: Positive for fatigue.  HENT: Negative.   Eyes: Negative.   Respiratory: Positive for shortness of breath.   Cardiovascular: Negative.   Gastrointestinal: Negative.   Genitourinary: Negative.   Musculoskeletal: Negative.   Skin: Negative.   Neurological: Negative.   Hematological: Negative.   Psychiatric/Behavioral: Negative.   All other systems reviewed and are negative.    Allergies  Ace inhibitors; Cimetidine; and Ranitidine hcl  Home Medications   Current Outpatient Rx  Name Route Sig Dispense Refill  . ALBUTEROL SULFATE HFA 108 (90 BASE) MCG/ACT IN AERS Inhalation Inhale 2 puffs into the lungs every 4 (four) hours as needed. For shortness of breath    . BUDESONIDE 180 MCG/ACT IN AEPB Inhalation Inhale 2 puffs into the lungs 2 (two) times daily.    Marland Kitchen CARVEDILOL 6.25 MG PO TABS Oral Take 1 tablet (6.25 mg total) by mouth 2 (two) times daily with a meal. 60 tablet 5  . VITAMIN D3 1000 UNITS PO CAPS Oral Take 2 capsules by mouth daily.     Marland Kitchen FAMOTIDINE 20 MG PO TABS Oral Take 20 mg by mouth at bedtime. One at bedtime    .  FUROSEMIDE 40 MG PO TABS Oral Take 40 mg by mouth 2 (two) times daily.    Marland Kitchen LORAZEPAM 0.5 MG PO TABS Oral Take 0.5 mg by mouth every 6 (six) hours as needed. For anxiety    . OLMESARTAN MEDOXOMIL 20 MG PO TABS Oral Take 20 mg by mouth daily.    Marland Kitchen POTASSIUM CHLORIDE CRYS ER 20 MEQ PO TBCR Oral Take 20 mEq by mouth 2 (two) times daily.    Marland Kitchen SIMVASTATIN 80 MG PO TABS Oral Take 80 mg by mouth at bedtime.      . TERAZOSIN HCL 5 MG PO CAPS Oral Take 5 mg by mouth at bedtime.      . WARFARIN SODIUM 5 MG PO TABS Oral Take 2.5 mg by mouth daily.     Marland Kitchen MOXIFLOXACIN HCL 400 MG PO TABS Oral Take 1 tablet (400 mg total) by mouth daily. 10 tablet 0  . PREDNISONE 10 MG PO TABS  Take 6 tabs by mouth daily for 4 days then take 4 tabs by mouth daily  for 2 days then take 2 tabs by mouth daily for 2 days then take 1 tab by mouth daily for 2 days then take half of a tab by mouth daily for 2 days then stop. 39 tablet 0    BP 143/70  Pulse 75  Temp 97.6 F (36.4 C)  Resp 16  SpO2 97%  Physical Exam  Nursing note and vitals reviewed. GEN: Well-developed, well-nourished male in no distress HEENT: Atraumatic, normocephalic. Oropharynx clear without erythema EYES: PERRLA BL, no scleral icterus. NECK: Trachea midline, no meningismus CV: Regular rate and irregularly irregular rhythm. No murmurs, rubs, or gallops PULM: No respiratory distress.  No crackles, wheezes, or rales.  Dimished throughout. GI: soft, non-tender. No guarding, rebound, or tenderness. + bowel sounds  GU: deferred Neuro: cranial nerves 2-12 intact, no abnormalities of strength or sensation, A and O x 3 MSK: Patient moves all 4 extremities symmetrically, no deformity, edema, or injury noted Skin: No rashes petechiae, purpura, or jaundice Psych: no abnormality of mood   ED Course  Procedures (including critical care time)  CRITICAL CARE Performed by: Cyndra Numbers   Total critical care time: 30 minutes  Critical care time was exclusive of separately billable procedures and treating other patients.  Critical care was necessary to treat or prevent imminent or life-threatening deterioration.  Critical care was time spent personally by me on the following activities: development of treatment plan with patient and/or surrogate as well as nursing, discussions with consultants, evaluation of patient's response to treatment, examination of patient, obtaining history from patient or surrogate, ordering and performing treatments and interventions, ordering and review of laboratory studies, ordering and review of radiographic studies, pulse oximetry and re-evaluation of patient's condition.   Date: 12/09/2011  Rate: 77  Rhythm: atrial fibrillation  QRS Axis: normal   Intervals: QT prolonged  ST/T Wave abnormalities: ST depressions inferiorly  Conduction Disutrbances:none  Narrative Interpretation: Patient with inverted T waves as well a slight depression less than 1 mm in leads 2, 3, and aVF.  Old EKG Reviewed: changes noted   Labs Reviewed  PROTIME-INR - Abnormal; Notable for the following:    Prothrombin Time 23.2 (*)    INR 2.02 (*)    All other components within normal limits  POCT I-STAT, CHEM 8 - Abnormal; Notable for the following:    BUN 30 (*)    Creatinine, Ser 1.70 (*)    Glucose, Bld 124 (*)  All other components within normal limits  CBC  APTT  POCT I-STAT TROPONIN I   Dg Chest Portable 1 View  12/09/2011  *RADIOLOGY REPORT*  Clinical Data: Shortness of breath.  PORTABLE CHEST - 1 VIEW  Comparison: 09/29/2011  Findings: Portable view of the chest was obtained.  There are coarse lung markings which are similar to the prior examination and suggest chronic changes.  There are old right rib fractures.  There are increased densities at the right lung base and cannot exclude an inflammatory or infectious process in this region.  Heart size is upper limits of normal but stable.  Previous median sternotomy.  IMPRESSION: Patchy densities at the right lung base could represent a combination of chronic changes and volume loss.  However, an infectious or inflammatory process in this area cannot be excluded. In addition, there may be a small amount of right pleural fluid or thickening which has been present on prior examinations.  Original Report Authenticated By: Richarda Overlie, M.D.     1. COPD exacerbation   2. CAP (community acquired pneumonia)   3. Shortness of breath       MDM  Patient was evaluated by myself.  Given his history as well as the  changes I did call the interventional cardiologist, Dr. Riley Kill, to review the patient's ECG.  He felt that there was no need for immediate intervention but did recommend speaking with [facility].  I  completed the patient's work-up for his symptoms. This was remarkable mainly for a possible infiltrate on CXR.  Patient continued to report that he was not short of breath at rest when asked.  I spoke with [facility] and Dr. Marshell Levan saw the patient in the ED.  He felt patient most likely was just having a COPD exacerbation and could follow-up as an outpatient with Dr. [name].  He was shown the patient's ECG.  The patient remained hemodynamically stable and after prednisone and a breathing treatment in the ED as well as a dose of Avelox in the ED the patient was comfortable ambulating without shortness of breath.  The patient was discharged in good condition and he and his family were comfortable with plan for discharge.  Following discharge a call was received and a less expensive antibiotic was requested by the patient.  He was switched to Levaquin.  Though the patient has been in the hospital in the past 90 days this was felt to be representative of a CAP.  Patient also was discharged with a prednisone taper.          Cyndra Numbers, MD 12/09/11 [phone]

## 2011-12-09 NOTE — ED Notes (Signed)
Pt. Reports that his SOB began yesterday and he called his MD which advised him to take 60 mg  Lasix.  Pt. Reports no improvement.  He was very sob thru the night and with exertion.  Does where 2.5L of oxygen at night.  Pt. Denies any chest pain or pressure. Pt. Has cyanosis around his mouth and lips.

## 2011-12-09 NOTE — Progress Notes (Signed)
Patient ID: [name] [name] MRN: [id number], DOB/AGE: Jul 26, 1926   Admit date: 12/09/2011   Primary Physician: [name], MD, MD Primary Cardiologist: Dr [name]  HPI: 78 y/o pt of [facility] and Dr [name] with longstanding CAD, COPD, CAF on Coumadin, and ICM. He was recently admitted Feb 2013 with CHF. His EF had deteriorated to 20% by echo. He underwent cath by Dr Tresa Endo which showed patent grafts with distal CAD and occlude limbs of skip grafts.. The plan was for continued medical Rx. He is seen in the ER now with complaints of SOB which started last night. He says his wgt is up 5 lbs since February but that this was a gradual increase. He is improved in ER with out treatment.   Problem List: Past Medical History  Diagnosis Date  . HTN (hypertension)   . GERD (gastroesophageal reflux disease)   . Hyperlipidemia   . Hypertensive cardiovascular disease   . CAD (coronary artery disease)   . MI (myocardial infarction) 1989  . Atrial fibrillation 1/09    s/p DCCV to NSR   . Pulmonary granuloma     left  . Ascending aortic aneurysm   . COPD (chronic obstructive pulmonary disease)   . Shortness of breath   . CAD (coronary artery disease) of bypass graft,SVG to RCA occluded, Seq. limb of SVG to PLA1 &PLA2 occluded, Seq. VG to Ramus occluded, PATENT LIMA to LAD, Patent VG to DX1, Patent Vg to PDA 10/02/11 10/02/2011  . Ischemic cardiomyopathy 10/02/2011    Past Surgical History  Procedure Date  . Coronary artery bypass graft 1996  . Stomach surgery   . Mastoidectomy     right ear  . Cardiac catheterization      Allergies:  Allergies  Allergen Reactions  . Ace Inhibitors     Cough  . Cimetidine     REACTION: unknown reaction, pt states per Dr. Jarold Motto  . Ranitidine Hcl     REACTION: unknown reaction, pt states per Dr. Jarold Motto     Home Medications  (Not in a hospital admission)   Family History  Problem Relation Age of Onset  . Liver cancer Sister   . Hypertension  Sister   . Hypertension Father      History   Social History  . Marital Status: Widowed    Spouse Name: N/A    Number of Children: N/A  . Years of Education: N/A   Occupational History  . 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 Smoking status: Former Smoker -- 0.5 packs/day for 45 years    Types: Cigarettes, Cigars    Quit date: 12/26/1987  . Smokeless tobacco: Never Used  . Alcohol Use: Yes     Occas Beer  . Drug Use: No  . Sexually Active: Not on file   Other Topics Concern  . Not on file   Social History Narrative  . No narrative on file     Review of Systems: No fever or chills, no productive cough, no angina.   Physical Exam: Blood pressure 139/77, pulse 80, temperature 97.6 F (36.4 C), resp. rate 20, SpO2 100.00%.  General appearance: alert, cooperative, no distress and pale, chronically ill appearing. Neck: no carotid bruit, no JVD and supple, symmetrical, trachea midline Lungs: decreased breath sounds Rt base Heart: irregularly irregular rhythm Abdomen: soft, non-tender; bowel sounds normal; no masses,  no organomegaly Extremities: no edema Pulses: 2+ and symmetric Skin: Skin color, texture, turgor normal. No  rashes or lesions Neurologic: Grossly normal    Labs:   Results for orders placed during the hospital encounter of 12/09/11 (from the past 24 hour(s))  CBC     Status: Normal   Collection Time   12/09/11 10:54 AM      Component Value Range   WBC 6.5  4.0 - 10.5 (K/uL)   RBC 4.40  4.22 - 5.81 (MIL/uL)   Hemoglobin 13.3  13.0 - 17.0 (g/dL)   HCT 40.9  81.1 - 91.4 (%)   MCV 91.1  78.0 - 100.0 (fL)   MCH 30.2  26.0 - 34.0 (pg)   MCHC 33.2  30.0 - 36.0 (g/dL)   RDW 78.2  95.6 - 21.3 (%)   Platelets    150 - 400 (K/uL)   Value: PLATELET CLUMPS NOTED ON SMEAR, COUNT APPEARS ADEQUATE  PROTIME-INR     Status: Abnormal   Collection Time   12/09/11 10:54 AM      Component Value Range   Prothrombin Time 23.2 (*) 11.6 - 15.2  (seconds)   INR 2.02 (*) 0.00 - 1.49   APTT     Status: Normal   Collection Time   12/09/11 10:54 AM      Component Value Range   aPTT 36  24 - 37 (seconds)  POCT I-STAT, CHEM 8     Status: Abnormal   Collection Time   12/09/11 11:12 AM      Component Value Range   Sodium 142  135 - 145 (mEq/L)   Potassium 3.9  3.5 - 5.1 (mEq/L)   Chloride 105  96 - 112 (mEq/L)   BUN 30 (*) 6 - 23 (mg/dL)   Creatinine, Ser 0.86 (*) 0.50 - 1.35 (mg/dL)   Glucose, Bld 578 (*) 70 - 99 (mg/dL)   Calcium, Ion 4.69  6.29 - 1.32 (mmol/L)   TCO2 25  0 - 100 (mmol/L)   Hemoglobin 13.6  13.0 - 17.0 (g/dL)   HCT 52.8  41.3 - 24.4 (%)  POCT I-STAT TROPONIN I     Status: Normal   Collection Time   12/09/11 11:21 AM      Component Value Range   Troponin i, poc 0.03  0.00 - 0.08 (ng/mL)   Comment 3              Radiology/Studies: Dg Chest Portable 1 View  12/09/2011  *RADIOLOGY REPORT*  Clinical Data: Shortness of breath.  PORTABLE CHEST - 1 VIEW  Comparison: 09/29/2011  Findings: Portable view of the chest was obtained.  There are coarse lung markings which are similar to the prior examination and suggest chronic changes.  There are old right rib fractures.  There are increased densities at the right lung base and cannot exclude an inflammatory or infectious process in this region.  Heart size is upper limits of normal but stable.  Previous median sternotomy.  IMPRESSION: Patchy densities at the right lung base could represent a combination of chronic changes and volume loss.  However, an infectious or inflammatory process in this area cannot be excluded. In addition, there may be a small amount of right pleural fluid or thickening which has been present on prior examinations.  Original Report Authenticated By: Richarda Overlie, M.D.    EKG:AF with NSST  ASSESSMENT AND PLAN:  Principal Problem:  *Dyspnea Active Problems:  Abnormal CXR, RLL infiltrate   CABG 1999, Cath [place] 2013 with patent grafts and distal disease    Atrial fibrillation, chronic- permanent  Ischemic  cardiomyopathy, EF 20-25% by 2D Feb 2012  Ventricular tachycardia, non-sustained; secondary to Ischemic CM  Chronic renal disease, stage III  COPD (chronic obstructive pulmonary disease)  AAA (abdominal aortic aneurysm) 5.6cm by CT 7/12  Stage 3 chronic renal impairment associated with type 2 diabetes mellitus  HYPERLIPIDEMIA  HYPERTENSION  Chronic anticoagulation, secondary to Chronic a. fib.  Plan- Dr Rennis Golden to see.   Deland Pretty, PA-C 12/09/2011, 1:16 PM

## 2011-12-09 NOTE — Progress Notes (Signed)
Pt. Seen and examined. Agree with the NP/PA-C note as written. Mr. Snowdon was short of breath last night, but not so much today. There are no new EKG changes. He denies chest pain. Exam notable for clear lungs, no JVD or extremity edema. No clear signs of heart failure. CXR shows stable scarring and COPD changes. I feel this is more likely a COPD exacerbation and would treat it as such. Will arrange follow-up with Dr. [name] in our office. He is scheduled for a TTE in May. He has declined AICD.  I feel that he is likely ok to discharge from a cardiac standpoint.  Chrystie Nose, MD, [facility] Attending Cardiologist [facility]

## 2011-12-21 ENCOUNTER — Ambulatory Visit (HOSPITAL_COMMUNITY)
Admission: RE | Admit: 2011-12-21 | Discharge: 2011-12-21 | Disposition: A | Discharge status: Home / Self Care | Payer: Medicare Other | Source: Ambulatory Visit | Attending: Family Medicine | Admitting: Family Medicine

## 2011-12-21 ENCOUNTER — Other Ambulatory Visit: Payer: Self-pay | Admitting: Family Medicine

## 2011-12-21 DIAGNOSIS — M79661 Pain in right lower leg: Secondary | ICD-10-CM

## 2011-12-21 DIAGNOSIS — M79662 Pain in left lower leg: Secondary | ICD-10-CM

## 2011-12-21 DIAGNOSIS — M7989 Other specified soft tissue disorders: Secondary | ICD-10-CM | POA: Insufficient documentation

## 2011-12-21 DIAGNOSIS — M79609 Pain in unspecified limb: Secondary | ICD-10-CM | POA: Insufficient documentation

## 2011-12-26 ENCOUNTER — Encounter: Payer: Self-pay | Admitting: Vascular Surgery

## 2011-12-27 ENCOUNTER — Encounter: Payer: Self-pay | Admitting: Vascular Surgery

## 2011-12-27 ENCOUNTER — Ambulatory Visit (INDEPENDENT_AMBULATORY_CARE_PROVIDER_SITE_OTHER): Payer: Medicare Other | Admitting: Vascular Surgery

## 2011-12-27 VITALS — BP 110/59 | HR 68 | Temp 98.0°F | Ht 70.0 in | Wt 181.0 lb

## 2011-12-27 DIAGNOSIS — I7092 Chronic total occlusion of artery of the extremities: Secondary | ICD-10-CM | POA: Insufficient documentation

## 2011-12-27 NOTE — Progress Notes (Signed)
[facility] [place]  Patient name: [name] [name] MRN: [id number] DOB: [birth year] Sex: male  REASON FOR CONSULT: peripheral vascular disease. Referred by Dr. [name].  HPI: [name] [name] is a 78 y.o. male he states that he developed pain in both lower extremities approximately 4 weeks ago. He noted his symptoms came on fairly acutely. He describes claudication in both calves which is equal on both sides. This occurs approximately half a block. His activity is fairly limited because he has some issues with balance. He does not describe any history of rest pain or history of nonhealing ulcers. He also has some intermittent swelling in his lower extremities.  He had a Doppler study done which suggested severe bilateral lower extremity arterial occlusive disease and he was sent for vascular consultation. His symptoms have remained stable and he's had no acute change in his symptoms.  He has undergone previous coronary artery bypass surgery by Dr. Tyrone Sage. Vein was taken from the right leg for this procedure.  Past Medical History  Diagnosis Date  . HTN (hypertension)   . GERD (gastroesophageal reflux disease)   . Hyperlipidemia   . Hypertensive cardiovascular disease   . CAD (coronary artery disease)   . MI (myocardial infarction) 1989  . Atrial fibrillation 1/09    s/p DCCV to NSR   . Pulmonary granuloma     left  . Ascending aortic aneurysm   . COPD (chronic obstructive pulmonary disease)   . Shortness of breath   . CAD (coronary artery disease) of bypass graft,SVG to RCA occluded, Seq. limb of SVG to PLA1 &PLA2 occluded, Seq. VG to Ramus occluded, PATENT LIMA to LAD, Patent VG to DX1, Patent Vg to PDA 10/02/11 10/02/2011  . Ischemic cardiomyopathy 10/02/2011  . CHF (congestive heart failure)     Family History  Problem Relation Age of Onset  . Liver cancer Sister   . Hypertension Sister   . Hypertension Father     SOCIAL HISTORY: History  Substance Use  Topics  . Smoking status: Former Smoker -- 0.5 packs/day for 45 years    Types: Cigarettes, Cigars    Quit date: 12/26/1987  . Smokeless tobacco: Never Used  . Alcohol Use: No     Occas Beer    Allergies  Allergen Reactions  . Ace Inhibitors     Cough  . Cimetidine     REACTION: unknown reaction, pt states per Dr. Jarold Motto  . Ranitidine Hcl     REACTION: unknown reaction, pt states per Dr. Jarold Motto    Current Outpatient Prescriptions  Medication Sig Dispense Refill  . budesonide (PULMICORT) 180 MCG/ACT inhaler Inhale 2 puffs into the lungs 2 (two) times daily.      . carvedilol (COREG) 6.25 MG tablet Take 1 tablet (6.25 mg total) by mouth 2 (two) times daily with a meal.  60 tablet  5  . Cholecalciferol (VITAMIN D3) 1000 UNITS CAPS Take 2 capsules by mouth daily.       . famotidine (PEPCID) 20 MG tablet Take 20 mg by mouth at bedtime. One at bedtime      . furosemide (LASIX) 40 MG tablet Take 40 mg by mouth 2 (two) times daily.      Marland Kitchen LORazepam (ATIVAN) 0.5 MG tablet Take 0.5 mg by mouth every 6 (six) hours as needed. For anxiety      . olmesartan (BENICAR) 20 MG tablet Take 20 mg by mouth daily.      . potassium chloride  SA (K-DUR,KLOR-CON) 20 MEQ tablet Take 20 mEq by mouth 2 (two) times daily.      . simvastatin (ZOCOR) 80 MG tablet Take 80 mg by mouth at bedtime.        Marland Kitchen terazosin (HYTRIN) 5 MG capsule Take 5 mg by mouth at bedtime.        Marland Kitchen warfarin (COUMADIN) 5 MG tablet Take 2.5 mg by mouth daily.       Marland Kitchen albuterol (PROVENTIL HFA;VENTOLIN HFA) 108 (90 BASE) MCG/ACT inhaler Inhale 2 puffs into the lungs every 4 (four) hours as needed. For shortness of breath      . predniSONE (DELTASONE) 10 MG tablet Take 6 tabs by mouth daily for 4 days then take 4 tabs by mouth daily for 2 days then take 2 tabs by mouth daily for 2 days then take 1 tab by mouth daily for 2 days then take half of a tab by mouth daily for 2 days then stop.  39 tablet  0    REVIEW OF SYSTEMS: Arly.Keller ] denotes  positive finding; [  ] denotes negative finding  CARDIOVASCULAR:  [ ]  chest pain   [ ]  chest pressure   [ ]  palpitations   Arly.Keller ] orthopnea   Arly.Keller ] dyspnea on exertion   [ ]  claudication   [ ]  rest pain   [ ]  DVT   [ ]  phlebitis PULMONARY:   [ ]  productive cough   [ ]  asthma   [ ]  wheezing NEUROLOGIC:   Arly.Keller ] weakness- generalized  [ ]  paresthesias  [ ]  aphasia  [ ]  amaurosis  [ ]  dizziness Arly.Keller ] unsteady gait HEMATOLOGIC:   [ ]  bleeding problems   [ ]  clotting disorders MUSCULOSKELETAL:  [ ]  joint pain   [ ]  joint swelling [ ]  leg swelling GASTROINTESTINAL: [ ]   blood in stool  [ ]   hematemesis GENITOURINARY:  [ ]   dysuria  [ ]   hematuria PSYCHIATRIC:  [ ]  history of major depression INTEGUMENTARY:  [ ]  rashes  [ ]  ulcers CONSTITUTIONAL:  [ ]  fever   [ ]  chills  PHYSICAL EXAM: Filed Vitals:   12/27/11 1252  BP: 110/59  Pulse: 68  Temp: 98 F (36.7 C)  TempSrc: Oral  Height: 5\' 10"  (1.778 m)  Weight: 181 lb (82.101 kg)   Body mass index is 25.97 kg/(m^2). GENERAL: The patient is a well-nourished male, in no acute distress. The vital signs are documented above. CARDIOVASCULAR: There is a regular rate and rhythm without significant murmur appreciated. I do not detect carotid bruits. He has palpable femoral pulses and palpable popliteal pulses bilaterally. I cannot palpate dorsalis pedis or posterior tibial pulses on either side. He has no evidence of atheroembolic disease. He has mild swelling of the right lower extremity and some ecchymosis at his ankle consistent with an old sprain. PULMONARY: There is good air exchange bilaterally without wheezing or rales. ABDOMEN: Soft and non-tender with normal pitched bowel sounds. I do not palpate an abdominal aortic aneurysm. MUSCULOSKELETAL: There are no major deformities or cyanosis. NEUROLOGIC: No focal weakness or paresthesias are detected. He does have unsteady gait. SKIN: There are no ulcers or rashes noted. PSYCHIATRIC: The patient has a  normal affect.  DATA:  Lab Results  Component Value Date   WBC 6.5 12/09/2011   HGB 13.6 12/09/2011   HCT 40.0 12/09/2011   MCV 91.1 12/09/2011   PLT PLATELET CLUMPS NOTED ON SMEAR, COUNT APPEARS ADEQUATE 12/09/2011  Lab Results  Component Value Date   NA 142 12/09/2011   K 3.9 12/09/2011   CL 105 12/09/2011   CO2 22 10/08/2011   Lab Results  Component Value Date   CREATININE 1.70* 12/09/2011   Lab Results  Component Value Date   INR 2.02* 12/09/2011   INR 2.82* 10/08/2011   INR 2.58* 10/07/2011   I have reviewed his records from Dr. Varney Baas office. He also has a history of gout and has been followed with poor balance.  I have reviewed his arterial Doppler study which was performed on 12/21/2011. This shows monophasic Doppler signals in both feet in the dorsalis pedis and posterior tibial positions. ABI on the right and 78% an ABI on the left 70%.  I have also reviewed his venous duplex scan from 12/21/2011. They showed no evidence of deep venous thrombosis in either lower extremity.  MEDICAL ISSUES: Based on his exam, he has evidence of infrapopliteal artery occlusive disease bilaterally. Given his age and somewhat debilitated state she would not be any ideal candidate for revascularization. Fortunately his symptoms are not disabling. He does have fairly prominent popliteal pulses and when he comes in for a follow up visit, we will obtain a duplex scans of both popliteal arteries to rule out popped artery aneurysms. I've also ordered follow up ABIs for that time. In the meantime I have encouraged him to stay as active as possible. Fortunately he is not a smoker. He knows to call sooner if he has problems.  Of note he does have a history of an aneurysm of the ascending thoracic aorta which is followed by Dr. Tyrone Sage.  Jama Mcmiller S [facility] [place] Beeper: [phone]

## 2012-01-04 ENCOUNTER — Ambulatory Visit: Payer: Medicare Other | Attending: Family Medicine | Admitting: Physical Therapy

## 2012-01-04 DIAGNOSIS — IMO0001 Reserved for inherently not codable concepts without codable children: Secondary | ICD-10-CM | POA: Insufficient documentation

## 2012-01-04 DIAGNOSIS — M6281 Muscle weakness (generalized): Secondary | ICD-10-CM | POA: Insufficient documentation

## 2012-01-04 DIAGNOSIS — R5381 Other malaise: Secondary | ICD-10-CM | POA: Insufficient documentation

## 2012-01-04 DIAGNOSIS — R262 Difficulty in walking, not elsewhere classified: Secondary | ICD-10-CM | POA: Insufficient documentation

## 2012-01-08 ENCOUNTER — Other Ambulatory Visit (HOSPITAL_COMMUNITY): Payer: Self-pay | Admitting: Cardiovascular Disease

## 2012-01-08 ENCOUNTER — Other Ambulatory Visit: Payer: Self-pay | Admitting: Family Medicine

## 2012-01-08 DIAGNOSIS — I2510 Atherosclerotic heart disease of native coronary artery without angina pectoris: Secondary | ICD-10-CM

## 2012-01-08 DIAGNOSIS — I739 Peripheral vascular disease, unspecified: Secondary | ICD-10-CM

## 2012-01-08 DIAGNOSIS — R269 Unspecified abnormalities of gait and mobility: Secondary | ICD-10-CM

## 2012-01-09 ENCOUNTER — Ambulatory Visit: Payer: Medicare Other | Admitting: Physical Therapy

## 2012-01-10 ENCOUNTER — Ambulatory Visit (HOSPITAL_COMMUNITY)
Admission: RE | Admit: 2012-01-10 | Discharge: 2012-01-10 | Disposition: A | Discharge status: Home / Self Care | Payer: Medicare Other | Source: Ambulatory Visit | Attending: Family Medicine | Admitting: Family Medicine

## 2012-01-10 ENCOUNTER — Ambulatory Visit (HOSPITAL_COMMUNITY)
Admission: RE | Admit: 2012-01-10 | Discharge: 2012-01-10 | Disposition: A | Discharge status: Home / Self Care | Payer: Medicare Other | Source: Ambulatory Visit | Attending: Cardiovascular Disease | Admitting: Cardiovascular Disease

## 2012-01-10 DIAGNOSIS — I2510 Atherosclerotic heart disease of native coronary artery without angina pectoris: Secondary | ICD-10-CM | POA: Insufficient documentation

## 2012-01-10 DIAGNOSIS — I10 Essential (primary) hypertension: Secondary | ICD-10-CM | POA: Insufficient documentation

## 2012-01-10 DIAGNOSIS — I6529 Occlusion and stenosis of unspecified carotid artery: Secondary | ICD-10-CM | POA: Insufficient documentation

## 2012-01-10 DIAGNOSIS — R269 Unspecified abnormalities of gait and mobility: Secondary | ICD-10-CM | POA: Insufficient documentation

## 2012-01-10 DIAGNOSIS — G319 Degenerative disease of nervous system, unspecified: Secondary | ICD-10-CM | POA: Insufficient documentation

## 2012-01-10 DIAGNOSIS — Z951 Presence of aortocoronary bypass graft: Secondary | ICD-10-CM | POA: Insufficient documentation

## 2012-01-10 DIAGNOSIS — R42 Dizziness and giddiness: Secondary | ICD-10-CM | POA: Insufficient documentation

## 2012-01-10 DIAGNOSIS — I739 Peripheral vascular disease, unspecified: Secondary | ICD-10-CM

## 2012-01-19 ENCOUNTER — Encounter: Payer: Medicare Other | Admitting: Vascular Surgery

## 2012-01-25 ENCOUNTER — Telehealth: Payer: Self-pay | Admitting: Vascular Surgery

## 2012-01-25 NOTE — Telephone Encounter (Signed)
Spoke with patient at request of Eunice Blase @ Dr.Moore's office (pcp). She had indicated pt needed to be seen again for severe leg pain. Pt was seen at our office on 12/27/11 by [facility]. I spoke at length with the patient and he stated that his leg pain was no worse than when he was seen on 12/27/11 and he was instructed by CSD to come back in 6 mos for follow up. He didn't want to come in any sooner. I offered him an appt for 01/31/12 with Dr.Dickson but he declined. Left message for Eunice Blase @ [phone] at [facility] regarding this. Jacklyn Shell

## 2012-01-31 ENCOUNTER — Ambulatory Visit: Payer: Medicare Other | Admitting: Vascular Surgery

## 2012-02-19 ENCOUNTER — Other Ambulatory Visit: Payer: Self-pay | Admitting: Cardiothoracic Surgery

## 2012-02-19 DIAGNOSIS — I712 Thoracic aortic aneurysm, without rupture, unspecified: Secondary | ICD-10-CM

## 2012-02-26 ENCOUNTER — Other Ambulatory Visit: Payer: Self-pay | Admitting: Cardiothoracic Surgery

## 2012-02-26 DIAGNOSIS — I712 Thoracic aortic aneurysm, without rupture: Secondary | ICD-10-CM

## 2012-02-26 DIAGNOSIS — I2510 Atherosclerotic heart disease of native coronary artery without angina pectoris: Secondary | ICD-10-CM

## 2012-03-28 ENCOUNTER — Other Ambulatory Visit: Payer: Medicare Other

## 2012-03-28 ENCOUNTER — Other Ambulatory Visit: Payer: Self-pay | Admitting: *Deleted

## 2012-03-28 ENCOUNTER — Inpatient Hospital Stay
Admission: RE | Admit: 2012-03-28 | Discharge: 2012-03-28 | Payer: Medicare Other | Source: Ambulatory Visit | Attending: Cardiothoracic Surgery | Admitting: Cardiothoracic Surgery

## 2012-03-28 ENCOUNTER — Ambulatory Visit: Payer: Medicare Other | Admitting: Cardiothoracic Surgery

## 2012-03-28 DIAGNOSIS — I712 Thoracic aortic aneurysm, without rupture: Secondary | ICD-10-CM

## 2012-03-29 ENCOUNTER — Encounter: Payer: Self-pay | Admitting: *Deleted

## 2012-03-29 ENCOUNTER — Other Ambulatory Visit: Payer: Self-pay | Admitting: *Deleted

## 2012-03-29 ENCOUNTER — Ambulatory Visit: Payer: Medicare Other | Admitting: Cardiothoracic Surgery

## 2012-04-02 ENCOUNTER — Other Ambulatory Visit: Payer: Medicare Other

## 2012-04-02 ENCOUNTER — Other Ambulatory Visit: Payer: Self-pay | Admitting: *Deleted

## 2012-04-02 DIAGNOSIS — I712 Thoracic aortic aneurysm, without rupture: Secondary | ICD-10-CM

## 2012-04-04 ENCOUNTER — Ambulatory Visit: Payer: Medicare Other | Admitting: Cardiothoracic Surgery

## 2012-04-11 ENCOUNTER — Ambulatory Visit: Payer: Medicare Other | Admitting: Cardiothoracic Surgery

## 2012-04-18 ENCOUNTER — Ambulatory Visit
Admission: RE | Admit: 2012-04-18 | Discharge: 2012-04-18 | Disposition: A | Discharge status: Home / Self Care | Payer: Medicare Other | Source: Ambulatory Visit | Attending: Cardiothoracic Surgery | Admitting: Cardiothoracic Surgery

## 2012-04-18 ENCOUNTER — Ambulatory Visit (INDEPENDENT_AMBULATORY_CARE_PROVIDER_SITE_OTHER): Payer: Medicare Other | Admitting: Cardiothoracic Surgery

## 2012-04-18 ENCOUNTER — Encounter: Payer: Self-pay | Admitting: Cardiothoracic Surgery

## 2012-04-18 VITALS — BP 95/54 | HR 80 | Resp 16 | Ht 70.0 in | Wt 182.0 lb

## 2012-04-18 DIAGNOSIS — I77810 Thoracic aortic ectasia: Secondary | ICD-10-CM

## 2012-04-18 DIAGNOSIS — I712 Thoracic aortic aneurysm, without rupture: Secondary | ICD-10-CM

## 2012-04-18 NOTE — Progress Notes (Deleted)
See dictation

## 2012-04-18 NOTE — Progress Notes (Signed)
[address]            [place] [address]          [phone]      GREGOR DERSHEM [facility] Medical Record #[id number] Date of Birth: 1925/11/25  Referring:[place]: [name], MD  Chief Complaint:    Chief Complaint  Patient presents with  . Thoracic Aortic Aneurysm    1 year F/U with Chest CT, surveillance of dilated ascending aorta    History of Present Illness:    Patient returns today with a followup CT scan one year after previous one. He has a known dilated ascending aorta. Several months ago he he presented with congestive heart failure repeat cardiac catheterization was performed, it was not felt that the patient had reconstructable coronary disease. He was offered a defibrillator or life vest.   Currently he is limited in his physical early primarily due to doubt in both lower extremities.   Current Activity/ Functional Status: Patient is not independent with mobility/ambulation, transfers, ADL's, IADL's.   Past Medical History  Diagnosis Date  . HTN (hypertension)   . GERD (gastroesophageal reflux disease)   . Hyperlipidemia   . Hypertensive cardiovascular disease   . CAD (coronary artery disease)   . MI (myocardial infarction) 1989  . Atrial fibrillation 1/09    s/p DCCV to NSR   . Pulmonary granuloma     left  . Ascending aortic aneurysm   . COPD (chronic obstructive pulmonary disease)   . Shortness of breath   . CAD (coronary artery disease) of bypass graft,SVG to RCA occluded, Seq. limb of SVG to PLA1 &PLA2 occluded, Seq. VG to Ramus occluded, PATENT LIMA to LAD, Patent VG to DX1, Patent Vg to PDA 10/02/11 10/02/2011  . Ischemic cardiomyopathy 10/02/2011  . CHF (congestive heart failure)     Past Surgical History  Procedure Date  . Stomach surgery   . Mastoidectomy     right ear  . Cardiac catheterization 10/01/2011  . Coronary artery bypass graft 12/1994    Family History  Problem Relation Age of  Onset  . Liver cancer Sister   . Hypertension Sister   . Hypertension Father     History   Social History  . Marital Status: Widowed    Spouse Name: N/A    Number of Children: N/A  . Years of Education: N/A   Occupational History  . 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 Smoking status: Former Smoker -- 0.5 packs/day for 45 years    Types: Cigarettes, Cigars    Quit date: 12/26/1987  . Smokeless tobacco: Never Used  . Alcohol Use: No     Occas Beer  . Drug Use: No  . Sexually Active: Not on file   Other Topics Concern  . Not on file   Social History Narrative  . No narrative on file    History  Smoking status  . Former Smoker -- 0.5 packs/day for 45 years  . Types: Cigarettes, Cigars  . Quit date: 12/26/1987  Smokeless tobacco  . Never Used    History  Alcohol Use No    Occas Beer     Allergies  Allergen Reactions  . Ace Inhibitors     Cough  . Cimetidine     REACTION: unknown reaction, pt states per Dr. Jarold Motto  .  Ranitidine Hcl     REACTION: unknown reaction, pt states per Dr. Jarold Motto    Current Outpatient Prescriptions  Medication Sig Dispense Refill  . carvedilol (COREG) 6.25 MG tablet Take 1 tablet (6.25 mg total) by mouth 2 (two) times daily with a meal.  60 tablet  5  . cilostazol (PLETAL) 50 MG tablet Take 50 mg by mouth 1 day or 1 dose.      . furosemide (LASIX) 40 MG tablet Take 40 mg by mouth 2 (two) times daily.      Marland Kitchen LORazepam (ATIVAN) 0.5 MG tablet Take 0.5 mg by mouth every 6 (six) hours as needed. For anxiety      . Multiple Vitamin (MULTIVITAMIN) tablet Take 1 tablet by mouth daily.      Marland Kitchen olmesartan (BENICAR) 20 MG tablet Take 20 mg by mouth daily.      Marland Kitchen spironolactone (ALDACTONE) 25 MG tablet Take 25 mg by mouth daily. 1/2 tab of 25 mg po daily      . terazosin (HYTRIN) 5 MG capsule Take 5 mg by mouth at bedtime.        Marland Kitchen warfarin (COUMADIN) 5 MG tablet Take 2.5 mg by mouth daily.            Review of  Systems:     Cardiac Review of Systems: Y or N  Chest Pain [  y  ]  Resting SOB [ y  ] Exertional SOB  [ y ]  Pollyann Kennedy Cove.Etienne  ]   Pedal Edema [ y]    Palpitations Cove.Etienne ] Syncope  [  n]   Presyncope [  y ]  General Review of Systems: [Y] = yes [  ]=no Constitional: recent weight change Cove.Etienne  ]; anorexia [  ]; fatigue Cove.Etienne  ]; nausea [  ]; night sweats [  ]; fever [  ]; or chills [  ];                                                                                                                                          Dental: poor dentition[ y ];   Eye : blurred vision [  ]; diplopia [   ]; vision changes [  ];  Amaurosis fugax[  ]; Resp: cough [  ];  wheezing[  ];  hemoptysis[  ]; shortness of breath[  ]; paroxysmal nocturnal dyspnea[  ]; dyspnea on exertion[  ]; or orthopnea[  ];  GI:  gallstones[  ], vomiting[  ];  dysphagia[  ]; melena[  ];  hematochezia [  ]; heartburn[  ];   Hx of  Colonoscopy[  ]; GU: kidney stones [  ]; hematuria[  ];   dysuria [  ];  nocturia[  ];  history of     obstruction [ n ];             Skin:  rash, swelling[  ];, hair loss[  ];  peripheral edema[  ];  or itching[  ]; Musculosketetal: myalgias[  ];  joint swelling[y  ];  joint erythema[ y ];  joint pain[  ];  back pain[ y ];  Heme/Lymph: bruising[  ];  bleeding[  ];  anemia[  ];  Neuro: TIA[  ];  headaches[  ];  stroke[  ];  vertigo[  ];  seizures[  ];   paresthesias[  ];  difficulty walking[ y ];  Psych:depression[  ]; anxiety[  ];  Endocrine: diabetes[  ];  thyroid dysfunction[  ];  Immunizations: Flu [  ]; Pneumococcal[  ];  Other:  Physical Exam: BP 95/54  Pulse 80  Resp 16  Ht 5\' 10"  (1.778 m)  Wt 182 lb (82.555 kg)  BMI 26.11 kg/m2  SpO2 99%  General appearance: alert, cooperative, appears older than stated age and fatigued Neurologic: intact Heart: regular rate and rhythm, S1, S2 normal, no murmur, click, rub or gallop and normal apical impulse Lungs: clear to auscultation bilaterally and normal  percussion bilaterally Abdomen: soft, non-tender; bowel sounds normal; no masses,  no organomegaly Extremities: Bilateral tenderness in his ankles and great toe consistent with gout Sternum is stable and well healed from previous coronary artery bypass grafting in 1996   Diagnostic Studies & Laboratory data:     Recent Radiology Findings:   Ct Chest Wo Contrast  04/18/2012  *RADIOLOGY REPORT*  Clinical Data: Follow up thoracic aortic aneurysm, shortness of breath, status post CABG  CT CHEST WITHOUT CONTRAST  Technique:  Multidetector CT imaging of the chest was performed following the standard protocol without IV contrast.  Comparison: 03/06/2011  Findings: 5.6 cm ascending thoracic aortic aneurysm (series 3/image 30), unchanged. Proximal descending measures 3.5 cm.  Aorta measures 3.0 cm at the aortic hiatus.  Stable scattered areas of scarring and subpleural reticulation in the lungs bilaterally.  Stable 9 mm left lower lobe nodule (series 4/image 41), unchanged from 2011, benign.  6 mm calcified granuloma in the medial left lower lobe (series 4/image 32).  No new/suspicious pulmonary nodules. No pleural effusion or pneumothorax.  Visualized thyroid is unremarkable.  The heart is normal in size.  No pericardial effusion.  Coronary atherosclerosis. Postsurgical changes related to prior CABG. Atherosclerotic calcifications of the aortic arch.  Small hiatal hernia.  Postsurgical changes at the GE junction. Visualized upper abdomen is also notable for calcified hepatic and splenic granulomata, vascular calcifications, and right upper pole renal scarring.  Degenerative changes of the visualized thoracolumbar spine.  IMPRESSION: 5.6 cm ascending thoracic aortic aneurysm, unchanged.  Additional stable ancillary findings as above.   Original Report Authenticated By: Charline Bills, M.D.       Recent Lab Findings: Lab Results  Component Value Date   WBC 6.5 12/09/2011   HGB 13.6 12/09/2011   HCT 40.0  12/09/2011   PLT PLATELET CLUMPS NOTED ON SMEAR, COUNT APPEARS ADEQUATE 12/09/2011   GLUCOSE 124* 12/09/2011   ALT 14 11/12/2010   AST 20 11/12/2010   NA 142 12/09/2011   K 3.9 12/09/2011   CL 105 12/09/2011   CREATININE 1.70* 12/09/2011   BUN 30* 12/09/2011   CO2 22 10/08/2011   TSH 5.046* 09/30/2011   INR 2.02* 12/09/2011      Assessment / Plan:   Patient comes in today with a followup CT scan that shows a descending aorta 5.6 cm which is unchanged from your previous. With the patient's severe underlying medical problems and is  desire not to have any major intervention performed we discussed the natural history of dilated ascending aorta and congestive heart failure. He is turned down having a defibrillator or wearing a life vest, his rationale is he does not wish to be resuscitated to end up with the same problems that he currently has. He would like to make a followup appointment in one year which we will do.    Marland Kitchen HTN (hypertension)   . GERD (gastroesophageal reflux disease)   . Hyperlipidemia   . Hypertensive cardiovascular disease   . CAD (coronary artery disease)   . MI (myocardial infarction) 1989  . Atrial fibrillation 1/09    s/p DCCV to NSR   . Pulmonary granuloma     left  . Ascending aortic aneurysm   . COPD (chronic obstructive pulmonary disease)   . Shortness of breath   . CAD (coronary artery disease) of bypass graft,SVG to RCA occluded, Seq. limb of SVG to PLA1 &PLA2 occluded, Seq. VG to Ramus occluded, PATENT LIMA to LAD, Patent VG to DX1, Patent Vg to PDA 10/02/11 10/02/2011  . Ischemic cardiomyopathy 10/02/2011  . CHF (congestive heart failure)          Delight Ovens MD  Beeper [phone] Office [phone] 04/18/2012 4:32 PM

## 2012-06-25 LAB — CBC AND DIFFERENTIAL
HCT: 38 % — AB (ref 41–53)
Hemoglobin: 12.4 g/dL — AB (ref 13.5–17.5)

## 2012-07-02 ENCOUNTER — Encounter: Payer: Self-pay | Admitting: Neurosurgery

## 2012-07-03 ENCOUNTER — Ambulatory Visit (INDEPENDENT_AMBULATORY_CARE_PROVIDER_SITE_OTHER): Payer: Medicare Other | Admitting: Neurosurgery

## 2012-07-03 ENCOUNTER — Encounter (INDEPENDENT_AMBULATORY_CARE_PROVIDER_SITE_OTHER): Payer: Medicare Other | Admitting: *Deleted

## 2012-07-03 ENCOUNTER — Encounter: Payer: Self-pay | Admitting: Neurosurgery

## 2012-07-03 VITALS — BP 107/65 | HR 74 | Resp 16 | Ht 71.0 in | Wt 185.0 lb

## 2012-07-03 DIAGNOSIS — I7092 Chronic total occlusion of artery of the extremities: Secondary | ICD-10-CM

## 2012-07-03 DIAGNOSIS — Z48812 Encounter for surgical aftercare following surgery on the circulatory system: Secondary | ICD-10-CM

## 2012-07-03 NOTE — Progress Notes (Signed)
[facility] PAD/PVD Office Note  CC: PVD surveillance Referring Physician: Edilia Bo  History of Present Illness: 78 year old male patient of Dr. Adele Dan with a history of claudication without rest pain. The patient states he has had no further problems and most of his lower extremity pain his gout-related. The patient has no open laceration as well as lower extremities.  Past Medical History  Diagnosis Date  . HTN (hypertension)   . GERD (gastroesophageal reflux disease)   . Hyperlipidemia   . Hypertensive cardiovascular disease   . CAD (coronary artery disease)   . MI (myocardial infarction) 1989  . Atrial fibrillation 1/09    s/p DCCV to NSR   . Pulmonary granuloma     left  . Ascending aortic aneurysm   . COPD (chronic obstructive pulmonary disease)   . Shortness of breath   . CAD (coronary artery disease) of bypass graft,SVG to RCA occluded, Seq. limb of SVG to PLA1 &PLA2 occluded, Seq. VG to Ramus occluded, PATENT LIMA to LAD, Patent VG to DX1, Patent Vg to PDA 10/02/11 10/02/2011  . Ischemic cardiomyopathy 10/02/2011  . CHF (congestive heart failure)     ROS: [x]  Positive   [ ]  Denies    General: [ ]  Weight loss, [ ]  Fever, [ ]  chills Neurologic: [ ]  Dizziness, [ ]  Blackouts, [ ]  Seizure [ ]  Stroke, [ ]  "Mini stroke", [ ]  Slurred speech, [ ]  Temporary blindness; [ ]  weakness in arms or legs, [ ]  Hoarseness Cardiac: [ ]  Chest pain/pressure, [ ]  Shortness of breath at rest [ ]  Shortness of breath with exertion, [ ]  Atrial fibrillation or irregular heartbeat Vascular: [ ]  Pain in legs with walking, [ ]  Pain in legs at rest, [ ]  Pain in legs at night,  [ ]  Non-healing ulcer, [ ]  Blood clot in vein/DVT,   Pulmonary: [ ]  Home oxygen, [ ]  Productive cough, [ ]  Coughing up blood, [ ]  Asthma,  [ ]  Wheezing Musculoskeletal:  [ ]  Arthritis, [ ]  Low back pain, [ ]  Joint pain Hematologic: [ ]  Easy Bruising, [ ]  Anemia; [ ]  Hepatitis Gastrointestinal: [ ]   Blood in stool, [ ]  Gastroesophageal Reflux/heartburn, [ ]  Trouble swallowing Urinary: [ ]  chronic Kidney disease, [ ]  on HD - [ ]  MWF or [ ]  TTHS, [ ]  Burning with urination, [ ]  Difficulty urinating Skin: [ ]  Rashes, [ ]  Wounds Psychological: [ ]  Anxiety, [ ]  Depression   Social History History  Substance Use Topics  . Smoking status: Former Smoker -- 0.5 packs/day for 45 years    Types: Cigarettes, Cigars    Quit date: 12/26/1987  . Smokeless tobacco: Never Used  . Alcohol Use: No     Comment: Occas Beer    Family History Family History  Problem Relation Age of Onset  . Liver cancer Sister   . Hypertension Sister   . Hypertension Father     Allergies  Allergen Reactions  . Ace Inhibitors     Cough  . Cimetidine     REACTION: unknown reaction, pt states per Dr. Jarold Motto  . Ranitidine Hcl     REACTION: unknown reaction, pt states per Dr. Jarold Motto    Current Outpatient Prescriptions  Medication Sig Dispense Refill  . carvedilol (COREG) 6.25 MG tablet Take 1 tablet (6.25 mg total) by mouth 2 (two) times daily with a meal.  60 tablet  5  . cilostazol (PLETAL) 50 MG tablet Take 50 mg by mouth 1  day or 1 dose.      . furosemide (LASIX) 40 MG tablet Take 40 mg by mouth 2 (two) times daily.      Marland Kitchen LORazepam (ATIVAN) 0.5 MG tablet Take 0.5 mg by mouth every 6 (six) hours as needed. For anxiety      . Multiple Vitamin (MULTIVITAMIN) tablet Take 1 tablet by mouth daily.      Marland Kitchen olmesartan (BENICAR) 20 MG tablet Take 20 mg by mouth daily.      Marland Kitchen spironolactone (ALDACTONE) 25 MG tablet Take 25 mg by mouth daily. 1/2 tab of 25 mg po daily      . terazosin (HYTRIN) 5 MG capsule Take 5 mg by mouth at bedtime.        Marland Kitchen warfarin (COUMADIN) 5 MG tablet Take 2.5 mg by mouth daily.         Physical Examination  There were no vitals filed for this visit.  There is no height or weight on file to calculate BMI.  General:  WDWN in NAD Gait: Normal HEENT: WNL Eyes: Pupils  equal Pulmonary: normal non-labored breathing , without Rales, rhonchi,  wheezing Cardiac: RRR, without  Murmurs, rubs or gallops; No carotid bruits Abdomen: soft, NT, no masses Skin: no rashes, ulcers noted Vascular Exam/Pulses: Palpable femoral pulses bilaterally, PT and DP pulses are not palpable  Extremities without ischemic changes, no Gangrene , no cellulitis; no open wounds;  Musculoskeletal: no muscle wasting or atrophy  Neurologic: A&O X 3; Appropriate Affect ; SENSATION: normal; MOTOR FUNCTION:  moving all extremities equally. Speech is fluent/normal  Non-Invasive Vascular Imaging: ABIs today are 0.68 on the right, 0.59 on the left which is consistent with previous exam, right popliteal artery diameter is 1.04 x 0.97, left is 0.90 x 0.94 with no velocities greater than 200 cm/s  ASSESSMENT/PLAN: This is a patient with some claudication with a history of gout. The patient denies rest pain, he will followup in 6 months with repeat ABIs per his request. The patient does not want intervention at this time. The patient's questions were encouraged and answered, he is in agreement with this plan.  Lauree Chandler ANP  Clinic M.D.: Edilia Bo

## 2012-07-04 NOTE — Addendum Note (Signed)
Addended by: Sharee Pimple on: 07/04/2012 09:15 AM   Modules accepted: Orders

## 2012-08-07 LAB — LIPID PANEL
HDL: 41 mg/dL (ref 35–70)
Triglycerides: 118 mg/dL (ref 40–160)

## 2012-09-30 LAB — BASIC METABOLIC PANEL
BUN: 33 mg/dL — AB (ref 4–21)
Glucose: 124 mg/dL
Potassium: 4 mmol/L (ref 3.4–5.3)
Sodium: 143 mmol/L (ref 137–147)

## 2012-10-17 ENCOUNTER — Other Ambulatory Visit (HOSPITAL_COMMUNITY): Payer: Self-pay | Admitting: Cardiovascular Disease

## 2012-11-02 ENCOUNTER — Encounter: Payer: Self-pay | Admitting: *Deleted

## 2012-11-02 ENCOUNTER — Encounter (HOSPITAL_COMMUNITY): Payer: Self-pay | Admitting: *Deleted

## 2012-11-02 ENCOUNTER — Encounter (HOSPITAL_COMMUNITY): Payer: Self-pay | Admitting: Emergency Medicine

## 2012-11-02 ENCOUNTER — Inpatient Hospital Stay (HOSPITAL_COMMUNITY)
Admission: EM | Admit: 2012-11-02 | Discharge: 2012-11-06 | DRG: 291 | Disposition: A | Discharge status: Skilled Nursing Facility | Payer: Medicare Other | Attending: Cardiology | Admitting: Cardiology

## 2012-11-02 ENCOUNTER — Emergency Department (HOSPITAL_COMMUNITY): Payer: Medicare Other

## 2012-11-02 DIAGNOSIS — I2589 Other forms of chronic ischemic heart disease: Secondary | ICD-10-CM | POA: Diagnosis present

## 2012-11-02 DIAGNOSIS — I119 Hypertensive heart disease without heart failure: Secondary | ICD-10-CM | POA: Diagnosis present

## 2012-11-02 DIAGNOSIS — I5043 Acute on chronic combined systolic (congestive) and diastolic (congestive) heart failure: Principal | ICD-10-CM | POA: Diagnosis present

## 2012-11-02 DIAGNOSIS — N183 Chronic kidney disease, stage 3 unspecified: Secondary | ICD-10-CM | POA: Diagnosis present

## 2012-11-02 DIAGNOSIS — R0902 Hypoxemia: Secondary | ICD-10-CM

## 2012-11-02 DIAGNOSIS — Z8249 Family history of ischemic heart disease and other diseases of the circulatory system: Secondary | ICD-10-CM

## 2012-11-02 DIAGNOSIS — I4729 Other ventricular tachycardia: Secondary | ICD-10-CM | POA: Diagnosis present

## 2012-11-02 DIAGNOSIS — I252 Old myocardial infarction: Secondary | ICD-10-CM

## 2012-11-02 DIAGNOSIS — I255 Ischemic cardiomyopathy: Secondary | ICD-10-CM | POA: Diagnosis present

## 2012-11-02 DIAGNOSIS — K219 Gastro-esophageal reflux disease without esophagitis: Secondary | ICD-10-CM | POA: Diagnosis present

## 2012-11-02 DIAGNOSIS — J449 Chronic obstructive pulmonary disease, unspecified: Secondary | ICD-10-CM | POA: Diagnosis present

## 2012-11-02 DIAGNOSIS — I472 Ventricular tachycardia, unspecified: Secondary | ICD-10-CM | POA: Diagnosis present

## 2012-11-02 DIAGNOSIS — J4489 Other specified chronic obstructive pulmonary disease: Secondary | ICD-10-CM | POA: Diagnosis present

## 2012-11-02 DIAGNOSIS — I4891 Unspecified atrial fibrillation: Secondary | ICD-10-CM | POA: Diagnosis present

## 2012-11-02 DIAGNOSIS — I509 Heart failure, unspecified: Secondary | ICD-10-CM | POA: Diagnosis present

## 2012-11-02 DIAGNOSIS — Z881 Allergy status to other antibiotic agents status: Secondary | ICD-10-CM

## 2012-11-02 DIAGNOSIS — I714 Abdominal aortic aneurysm, without rupture, unspecified: Secondary | ICD-10-CM | POA: Diagnosis present

## 2012-11-02 DIAGNOSIS — Z87891 Personal history of nicotine dependence: Secondary | ICD-10-CM

## 2012-11-02 DIAGNOSIS — I130 Hypertensive heart and chronic kidney disease with heart failure and stage 1 through stage 4 chronic kidney disease, or unspecified chronic kidney disease: Secondary | ICD-10-CM | POA: Diagnosis present

## 2012-11-02 DIAGNOSIS — Z79899 Other long term (current) drug therapy: Secondary | ICD-10-CM

## 2012-11-02 DIAGNOSIS — I2789 Other specified pulmonary heart diseases: Secondary | ICD-10-CM | POA: Diagnosis present

## 2012-11-02 DIAGNOSIS — J9620 Acute and chronic respiratory failure, unspecified whether with hypoxia or hypercapnia: Secondary | ICD-10-CM | POA: Diagnosis present

## 2012-11-02 DIAGNOSIS — Z886 Allergy status to analgesic agent status: Secondary | ICD-10-CM

## 2012-11-02 DIAGNOSIS — E785 Hyperlipidemia, unspecified: Secondary | ICD-10-CM | POA: Diagnosis present

## 2012-11-02 DIAGNOSIS — Z888 Allergy status to other drugs, medicaments and biological substances status: Secondary | ICD-10-CM

## 2012-11-02 DIAGNOSIS — I5042 Chronic combined systolic (congestive) and diastolic (congestive) heart failure: Secondary | ICD-10-CM | POA: Diagnosis present

## 2012-11-02 DIAGNOSIS — Z7901 Long term (current) use of anticoagulants: Secondary | ICD-10-CM

## 2012-11-02 DIAGNOSIS — I2510 Atherosclerotic heart disease of native coronary artery without angina pectoris: Secondary | ICD-10-CM | POA: Diagnosis present

## 2012-11-02 DIAGNOSIS — Z951 Presence of aortocoronary bypass graft: Secondary | ICD-10-CM

## 2012-11-02 LAB — BASIC METABOLIC PANEL
BUN: 32 mg/dL — ABNORMAL HIGH (ref 6–23)
CO2: 24 mEq/L (ref 19–32)
Calcium: 9.8 mg/dL (ref 8.4–10.5)
Chloride: 103 mEq/L (ref 96–112)
Creatinine, Ser: 1.71 mg/dL — ABNORMAL HIGH (ref 0.50–1.35)
GFR calc Af Amer: 41 mL/min — ABNORMAL LOW (ref >90)
GFR calc non Af Amer: 35 mL/min — ABNORMAL LOW (ref >90)
Glucose, Bld: 132 mg/dL — ABNORMAL HIGH (ref 70–99)
Potassium: 3.4 mEq/L — ABNORMAL LOW (ref 3.5–5.1)
Sodium: 142 mEq/L (ref 135–145)

## 2012-11-02 LAB — CBC WITH DIFFERENTIAL/PLATELET
Basophils Absolute: 0 10*3/uL (ref 0.0–0.1)
Basophils Relative: 0 % (ref 0–1)
Eosinophils Absolute: 0.3 10*3/uL (ref 0.0–0.7)
Eosinophils Relative: 4 % (ref 0–5)
HCT: 39.9 % (ref 39.0–52.0)
Hemoglobin: 13.6 g/dL (ref 13.0–17.0)
Lymphocytes Relative: 26 % (ref 12–46)
Lymphs Abs: 2 10*3/uL (ref 0.7–4.0)
MCH: 30.3 pg (ref 26.0–34.0)
MCHC: 34.1 g/dL (ref 30.0–36.0)
MCV: 88.9 fL (ref 78.0–100.0)
Monocytes Absolute: 0.9 10*3/uL (ref 0.1–1.0)
Monocytes Relative: 12 % (ref 3–12)
Neutro Abs: 4.6 10*3/uL (ref 1.7–7.7)
Neutrophils Relative %: 59 % (ref 43–77)
Platelets: 141 10*3/uL — ABNORMAL LOW (ref 150–400)
RBC: 4.49 MIL/uL (ref 4.22–5.81)
RDW: 14.1 % (ref 11.5–15.5)
WBC: 7.8 10*3/uL (ref 4.0–10.5)

## 2012-11-02 LAB — PROTIME-INR
INR: 2 — ABNORMAL HIGH (ref 0.00–1.49)
Prothrombin Time: 21.9 seconds — ABNORMAL HIGH (ref 11.6–15.2)

## 2012-11-02 LAB — PRO B NATRIURETIC PEPTIDE: Pro B Natriuretic peptide (BNP): 9157 pg/mL — ABNORMAL HIGH (ref 0–450)

## 2012-11-02 LAB — POCT I-STAT TROPONIN I: Troponin i, poc: 0.02 ng/mL (ref 0.00–0.08)

## 2012-11-02 MED ORDER — ALLOPURINOL 300 MG PO TABS
300.0000 mg | ORAL_TABLET | Freq: Every day | ORAL | Status: DC
Start: 2012-11-02 — End: 2012-11-06
  Administered 2012-11-02 – 2012-11-06 (×5): 300 mg via ORAL
  Filled 2012-11-02 (×5): qty 1

## 2012-11-02 MED ORDER — ATORVASTATIN CALCIUM 80 MG PO TABS
80.0000 mg | ORAL_TABLET | Freq: Every day | ORAL | Status: DC
  Administered 2012-11-02 – 2012-11-06 (×5): 80 mg via ORAL
  Filled 2012-11-02 (×5): qty 1

## 2012-11-02 MED ORDER — CARVEDILOL 6.25 MG PO TABS
6.2500 mg | ORAL_TABLET | Freq: Two times a day (BID) | ORAL | Status: DC
  Administered 2012-11-03 – 2012-11-06 (×7): 6.25 mg via ORAL
  Filled 2012-11-02 (×8): qty 1

## 2012-11-02 MED ORDER — FUROSEMIDE 20 MG PO TABS
40.0000 mg | ORAL_TABLET | Freq: Once | ORAL | Status: AC
  Administered 2012-11-02: 40 mg via ORAL
  Filled 2012-11-02: qty 2

## 2012-11-02 MED ORDER — WARFARIN - PHYSICIAN DOSING INPATIENT: Freq: Every day | Status: DC

## 2012-11-02 MED ORDER — LORAZEPAM 0.5 MG PO TABS: 0.5000 mg | ORAL_TABLET | Freq: Every evening | ORAL | Status: DC | PRN

## 2012-11-02 MED ORDER — CILOSTAZOL 50 MG PO TABS
50.0000 mg | ORAL_TABLET | Freq: Two times a day (BID) | ORAL | Status: DC
  Administered 2012-11-02 – 2012-11-06 (×8): 50 mg via ORAL
  Filled 2012-11-02 (×9): qty 1

## 2012-11-02 MED ORDER — TERAZOSIN HCL 5 MG PO CAPS
5.0000 mg | ORAL_CAPSULE | Freq: Every day | ORAL | Status: DC
Start: 2012-11-02 — End: 2012-11-06
  Administered 2012-11-02 – 2012-11-05 (×4): 5 mg via ORAL
  Filled 2012-11-02 (×5): qty 1

## 2012-11-02 MED ORDER — FUROSEMIDE 10 MG/ML IJ SOLN
40.0000 mg | Freq: Two times a day (BID) | INTRAMUSCULAR | Status: DC
  Administered 2012-11-02 – 2012-11-05 (×6): 40 mg via INTRAVENOUS
  Filled 2012-11-02 (×8): qty 4

## 2012-11-02 MED ORDER — WARFARIN SODIUM 2.5 MG PO TABS
2.5000 mg | ORAL_TABLET | Freq: Every day | ORAL | Status: DC
  Administered 2012-11-02 – 2012-11-04 (×3): 2.5 mg via ORAL
  Filled 2012-11-02 (×4): qty 1

## 2012-11-02 MED ORDER — ADULT MULTIVITAMIN W/MINERALS CH
1.0000 | ORAL_TABLET | Freq: Every day | ORAL | Status: DC
Start: 2012-11-02 — End: 2012-11-06
  Administered 2012-11-02 – 2012-11-06 (×5): 1 via ORAL
  Filled 2012-11-02 (×5): qty 1

## 2012-11-02 MED ORDER — ONDANSETRON HCL 4 MG/2ML IJ SOLN: 4.0000 mg | Freq: Three times a day (TID) | INTRAMUSCULAR | Status: AC | PRN

## 2012-11-02 MED ORDER — SODIUM CHLORIDE 0.9 % IJ SOLN
3.0000 mL | Freq: Two times a day (BID) | INTRAMUSCULAR | Status: DC
  Administered 2012-11-02 – 2012-11-06 (×8): 3 mL via INTRAVENOUS

## 2012-11-02 MED ORDER — ENOXAPARIN SODIUM 30 MG/0.3ML ~~LOC~~ SOLN
30.0000 mg | SUBCUTANEOUS | Status: DC
  Administered 2012-11-02: 30 mg via SUBCUTANEOUS
  Filled 2012-11-02 (×3): qty 0.3

## 2012-11-02 MED ORDER — IRBESARTAN 150 MG PO TABS
150.0000 mg | ORAL_TABLET | Freq: Every day | ORAL | Status: DC
  Administered 2012-11-02 – 2012-11-06 (×5): 150 mg via ORAL
  Filled 2012-11-02 (×5): qty 1

## 2012-11-02 NOTE — Progress Notes (Signed)
ANTICOAGULATION CONSULT NOTE - Initial Consult  Pharmacy Consult for Coumadin Indication: atrial fibrillation  Allergies  Allergen Reactions  . Ace Inhibitors     Cough  . Cimetidine      unknown reaction, pt states per Dr. Jarold Motto  . Ciprofloxacin Other (See Comments)    unknown  . Nsaids     Dr does not want patient to take due to kidneys  . Zantac (Ranitidine Hcl)     Hepatitis per Dr. Jarold Motto    Patient Measurements: Height: 5\' 10"  (177.8 cm) Weight: 182 lb 5.1 oz (82.7 kg) IBW/kg (Calculated) : 73  Labs:  Recent Labs  11/02/12 1012  HGB 13.6  HCT 39.9  PLT 141*  LABPROT 21.9*  INR 2.00*  CREATININE 1.71*    Estimated Creatinine Clearance: 33.2 ml/min (by C-G formula based on Cr of 1.71).   Medical History: Past Medical History  Diagnosis Date  . HTN (hypertension)   . GERD (gastroesophageal reflux disease)   . Hyperlipidemia   . Hypertensive cardiovascular disease   . CAD (coronary artery disease)   . MI (myocardial infarction) 1989  . Atrial fibrillation 1/09    s/p DCCV to NSR   . Pulmonary granuloma     left  . Ascending aortic aneurysm   . COPD (chronic obstructive pulmonary disease)   . Shortness of breath   . CAD (coronary artery disease) of bypass graft,SVG to RCA occluded, Seq. limb of SVG to PLA1 &PLA2 occluded, Seq. VG to Ramus occluded, PATENT LIMA to LAD, Patent VG to DX1, Patent Vg to PDA 10/02/11 10/02/2011  . Ischemic cardiomyopathy 10/02/2011  . CHF (congestive heart failure)   . Hematuria   . Bleeding gastrointestinal   . Diabetes mellitus without complication   . Gout     Assessment: 79 year old male on Coumadin pta for Afib.  Admitted with SOB and WOB / CHF exacerbation  Dose PTA = 2.5 mg daily with admit INR of 2  Goal of Therapy:  INR 2-3 Monitor platelets by anticoagulation protocol: Yes   Plan:  1) Coumadin 2.5 mg po daily at 1800 pm 2) Daily INR  Thank you Okey Regal, PharmD [phone]  11/02/2012,6:09 PM

## 2012-11-02 NOTE — ED Notes (Addendum)
Pt from home via EMS, c/o difficulty breathing at night. Pt states he wakes up, can't get enough air and "rattles" when he breaths. Pt was seen 2 weeks ago at PCP for same.Denies sob, cp at this time.

## 2012-11-02 NOTE — ED Notes (Signed)
Pt ambulates w/ 2 assist. Pt O2 sat dropped to 78% with 20 feet of ambulation. Pt sats returned to 94%  After sitting down ( roughly 20 sec)

## 2012-11-02 NOTE — Consult Note (Signed)
Reason for Consult: acute on chronic systolic HF  Referring Physician:  Dr. Mahala Menghini  Cardiologist: Dr. Julieta Gutting [name] [name] is an 79 y.o. male.    Chief Complaint: SOB admitted 11/02/12   HPI: 79 y.o. male with relatively complex med h/o as below. He states that on 3.7.14 pm he went to bed around 21:00 hours he dozed off and went to the RR at around 21:50 and was about to go to sleep and lay there 30-45 min, and would awaken within half an hour and was SOB and this seeemd to recur all night. He states that the SOB has worsened progressively over the last 6-8 months. He states that he hasn;t been able to walk as far as he usually can across his living room-he thinks at best 40-50 feet, but feels more sob.  Ususally uses one pillow to sleep on at home. NO Chest pain, no radiating arm pain. No cough or cold, no fever-no sick contacts. Not noticed any LE swelling or stomach. NO recent gain in weight. Doesn;t know dry weight-last time he weighted was 2 weeks ago was 184 at [employer] office.   He decided to call 911 to come over to the Hospital and came in for a review.  His usal oxygen level ranges from 94-99%. He usually uses a walker to ambulate.   Emergency room workup revealed a BNP of 9157 chest x-ray two-view showed patchy interstitial chronic prominence is with potential lung scarring and maybe cephalization and potential pulmonary edema.  Some mild hypokalemia his basement metabolic panel concurred with prior as did CBC.  It was attempted to ambulate the patient in the emergency room and patient's saturations dropped into the high 70's/upper 80s and patient does not have an oxygen requirement at baseline.  He was admitted with HF and we have been asked to consult, due to complex cardiac hx.   Last cath 10/02/11- with hx CABG in 1999.  Cath revealed EF of 15-20%, moderate pul. HTN with PA pressure of 52 mm, significant known ascending aortic aneurysm with 2+ aortic insuff. Significant native CAD.   Patent LIMA to mid LAD with small distal LAD with 40-50% narrowing,  Patent initial limb of the vein graft supplying a diagonal vessel, but without visualization of the sequential limb which previously  supplied a ramus intermediate vessel.  Occluded vein graft to the right coronary artery.  Patent initial limb of the vein graft supplying the posterior descending artery with occlusion of the previous sequential limb supplying the posterolateral artery 1 and posterolateral artery 2 vessel.   It was felt that the patient's  significant reduction in LV function from his prior assessment most likely is due to graft occlusion leading to his diffuse severe global hypocontractility.  Medical therapy recommended.  Pt refused Life vest and has since refused ICD.  Echo 12/2011 EF 25-30%.      Currently: he is comfortable in bed. SOB with ambulation and talking.  Past Medical History  Diagnosis Date  . HTN (hypertension)   . GERD (gastroesophageal reflux disease)   . Hyperlipidemia   . Hypertensive cardiovascular disease   . CAD (coronary artery disease)     hx of CABG, with graft dysfunction  . MI (myocardial infarction) 1989  . Atrial fibrillation 1/09    s/p DCCV to NSR -now chronic  . Pulmonary granuloma     left  . Ascending aortic aneurysm   . COPD (chronic obstructive pulmonary disease)   . Shortness of breath   .  CAD (coronary artery disease) of bypass graft,SVG to RCA occluded, Seq. limb of SVG to PLA1 &PLA2 occluded, Seq. VG to Ramus occluded, PATENT LIMA to LAD, Patent VG to DX1, Patent Vg to PDA 10/02/11 10/02/2011  . Ischemic cardiomyopathy 10/02/2011  . CHF (congestive heart failure)   . Hematuria   . Bleeding gastrointestinal   . Diabetes mellitus without complication   . Gout     Past Surgical History  Procedure Laterality Date  . Stomach surgery    . Mastoidectomy      right ear  . Cardiac catheterization  10/01/2011  . Coronary artery bypass graft  12/1994  . Pr vein bypass  graft,aorto-fem-pop  1996  . Vagotomy  1973    Family History  Problem Relation Age of Onset  . Liver cancer Sister   . Hypertension Sister   . Cancer Sister   . Diabetes Sister   . Hypertension Father   . Heart disease Father   . Hyperlipidemia Father   . Heart disease Mother    Social History:  reports that he quit smoking about 24 years ago. His smoking use included Cigarettes and Cigars. He has a 22.5 pack-year smoking history. He has never used smokeless tobacco. He reports that he does not drink alcohol or use illicit drugs.  Allergies:  Allergies  Allergen Reactions  . Ace Inhibitors     Cough  . Cimetidine      unknown reaction, pt states per Dr. Jarold Motto  . Ciprofloxacin Other (See Comments)    unknown  . Nsaids     Dr does not want patient to take due to kidneys  . Zantac (Ranitidine Hcl)     Hepatitis per Dr. Jarold Motto    Medications Prior to Admission  Medication Sig Dispense Refill  . acetaminophen (TYLENOL) 500 MG tablet Take 1,000 mg by mouth every 6 (six) hours as needed for pain.      Marland Kitchen allopurinol (ZYLOPRIM) 300 MG tablet Take 300 mg by mouth daily.      Marland Kitchen atorvastatin (LIPITOR) 80 MG tablet Take 80 mg by mouth daily.      . carvedilol (COREG) 12.5 MG tablet Take 12.5 mg by mouth 2 (two) times daily.       . cilostazol (PLETAL) 50 MG tablet Take 50 mg by mouth 2 (two) times daily.       . furosemide (LASIX) 40 MG tablet Take 40 mg by mouth 2 (two) times daily.      Marland Kitchen LORazepam (ATIVAN) 0.5 MG tablet Take 0.5 mg by mouth at bedtime as needed for anxiety (to help sleep). For anxiety      . Multiple Vitamin (MULTIVITAMIN WITH MINERALS) TABS Take 1 tablet by mouth daily.      Marland Kitchen olmesartan (BENICAR) 20 MG tablet Take 10 mg by mouth daily.       Marland Kitchen terazosin (HYTRIN) 5 MG capsule Take 5 mg by mouth at bedtime.        Marland Kitchen warfarin (COUMADIN) 5 MG tablet Take 2.5 mg by mouth daily.         Results for orders placed during the hospital encounter of 11/02/12 (from  the past 48 hour(s))  CBC WITH DIFFERENTIAL     Status: Abnormal   Collection Time    11/02/12 10:12 AM      Result Value Range   WBC 7.8  4.0 - 10.5 K/uL   RBC 4.49  4.22 - 5.81 MIL/uL   Hemoglobin 13.6  13.0 - 17.0  g/dL   HCT 21.3  08.6 - 57.8 %   MCV 88.9  78.0 - 100.0 fL   MCH 30.3  26.0 - 34.0 pg   MCHC 34.1  30.0 - 36.0 g/dL   RDW 46.9  62.9 - 52.8 %   Platelets 141 (*) 150 - 400 K/uL   Neutrophils Relative 59  43 - 77 %   Neutro Abs 4.6  1.7 - 7.7 K/uL   Lymphocytes Relative 26  12 - 46 %   Lymphs Abs 2.0  0.7 - 4.0 K/uL   Monocytes Relative 12  3 - 12 %   Monocytes Absolute 0.9  0.1 - 1.0 K/uL   Eosinophils Relative 4  0 - 5 %   Eosinophils Absolute 0.3  0.0 - 0.7 K/uL   Basophils Relative 0  0 - 1 %   Basophils Absolute 0.0  0.0 - 0.1 K/uL  BASIC METABOLIC PANEL     Status: Abnormal   Collection Time    11/02/12 10:12 AM      Result Value Range   Sodium 142  135 - 145 mEq/L   Potassium 3.4 (*) 3.5 - 5.1 mEq/L   Chloride 103  96 - 112 mEq/L   CO2 24  19 - 32 mEq/L   Glucose, Bld 132 (*) 70 - 99 mg/dL   BUN 32 (*) 6 - 23 mg/dL   Creatinine, Ser 4.13 (*) 0.50 - 1.35 mg/dL   Calcium 9.8  8.4 - 24.4 mg/dL   GFR calc non Af Amer 35 (*) >90 mL/min   GFR calc Af Amer 41 (*) >90 mL/min   Comment:            The eGFR has been calculated     using the CKD EPI equation.     This calculation has not been     validated in all clinical     situations.     eGFR's persistently     <90 mL/min signify     possible Chronic Kidney Disease.  PROTIME-INR     Status: Abnormal   Collection Time    11/02/12 10:12 AM      Result Value Range   Prothrombin Time 21.9 (*) 11.6 - 15.2 seconds   INR 2.00 (*) 0.00 - 1.49  PRO B NATRIURETIC PEPTIDE     Status: Abnormal   Collection Time    11/02/12 10:13 AM      Result Value Range   Pro B Natriuretic peptide (BNP) 9157.0 (*) 0 - 450 pg/mL  POCT I-STAT TROPONIN I     Status: None   Collection Time    11/02/12 10:32 AM      Result  Value Range   Troponin i, poc 0.02  0.00 - 0.08 ng/mL   Comment 3            Comment: Due to the release kinetics of cTnI,     a negative result within the first hours     of the onset of symptoms does not rule out     myocardial infarction with certainty.     If myocardial infarction is still suspected,     repeat the test at appropriate intervals.  TROPONIN I     Status: None   Collection Time    11/03/12  5:00 AM      Result Value Range   Troponin I <0.30  <0.30 ng/mL   Comment:  Due to the release kinetics of cTnI,     a negative result within the first hours     of the onset of symptoms does not rule out     myocardial infarction with certainty.     If myocardial infarction is still suspected,     repeat the test at appropriate intervals.  COMPREHENSIVE METABOLIC PANEL     Status: Abnormal   Collection Time    11/03/12  5:10 AM      Result Value Range   Sodium 142  135 - 145 mEq/L   Potassium 3.0 (*) 3.5 - 5.1 mEq/L   Chloride 102  96 - 112 mEq/L   CO2 25  19 - 32 mEq/L   Glucose, Bld 126 (*) 70 - 99 mg/dL   BUN 33 (*) 6 - 23 mg/dL   Creatinine, Ser 1.61 (*) 0.50 - 1.35 mg/dL   Calcium 9.1  8.4 - 09.6 mg/dL   Total Protein 6.5  6.0 - 8.3 g/dL   Albumin 3.4 (*) 3.5 - 5.2 g/dL   AST 17  0 - 37 U/L   ALT 42  0 - 53 U/L   Alkaline Phosphatase 215 (*) 39 - 117 U/L   Total Bilirubin 1.2  0.3 - 1.2 mg/dL   GFR calc non Af Amer 36 (*) >90 mL/min   GFR calc Af Amer 41 (*) >90 mL/min   Comment:            The eGFR has been calculated     using the CKD EPI equation.     This calculation has not been     validated in all clinical     situations.     eGFR's persistently     <90 mL/min signify     possible Chronic Kidney Disease.  CBC     Status: Abnormal   Collection Time    11/03/12  5:10 AM      Result Value Range   WBC 8.5  4.0 - 10.5 K/uL   RBC 4.08 (*) 4.22 - 5.81 MIL/uL   Hemoglobin 12.4 (*) 13.0 - 17.0 g/dL   HCT 04.5 (*) 40.9 - 81.1 %   MCV 88.7   78.0 - 100.0 fL   MCH 30.4  26.0 - 34.0 pg   MCHC 34.3  30.0 - 36.0 g/dL   RDW 91.4  78.2 - 95.6 %   Platelets 137 (*) 150 - 400 K/uL  PROTIME-INR     Status: Abnormal   Collection Time    11/03/12  5:10 AM      Result Value Range   Prothrombin Time 22.9 (*) 11.6 - 15.2 seconds   INR 2.13 (*) 0.00 - 1.49   Dg Chest 2 View  11/02/2012  *RADIOLOGY REPORT*  Clinical Data: Shortness of breath.  CHEST - 2 VIEW  Comparison: Chest x-ray 12/09/2011.  Findings: There are patchy areas of interstitial prominence and architectural distortion and scattered throughout the mid and lower lungs bilaterally, which is similar to but slightly increased compared to the prior examination from 12/09/2011.  Patchy ill- defined bibasilar opacities are also noted. Blunting of both costophrenic sulci.  Pulmonary vasculature appears mildly engorged. Mild cardiomegaly is unchanged. The patient is rotated to the right on today's exam, resulting in distortion of the mediastinal contours and reduced diagnostic sensitivity and specificity for mediastinal pathology.  Atherosclerosis in the thoracic aorta. Status post median sternotomy for CABG.  Multiple surgical clips near the gastroesophageal junction.  IMPRESSION: 1.  Overall, the appearance of chest is very similar to the prior examination, with a background of patchy diffuse interstitial prominence which appears to be chronic and likely indicative of some degree of multifocal scarring and/or underlying interstitial lung disease.  In addition, there does appear to be some cephalization of the pulmonary vasculature, given the presence of cardiomegaly, superimposed interstitial pulmonary edema related to congestive heart failure is possible. 2.  There are likely small bilateral pleural effusions (versus chronic pleural scarring). 3.  Atherosclerosis. 4.  Postoperative changes, as above.   Original Report Authenticated By: Trudie Reed, M.D.     ROS: General:no colds or fevers, +  weight changes wt on 10/15/12 was 187 Skin:no rashes or ulcers HEENT:no blurred vision, no congestion CV:see HPI PUL:see HPI GI:no diarrhea constipation or melena, no indigestion GU:no hematuria, no dysuria MS:no joint pain, no claudication Neuro:no syncope, no lightheadedness Endo:no diabetes, no thyroid disease   Blood pressure 142/72, pulse 82, temperature 97.6 F (36.4 C), temperature source Oral, resp. rate 18, height 5\' 10"  (1.778 m), weight 82.7 kg (182 lb 5.1 oz), SpO2 97.00%. PE: General: no acute distress, stills dyspneic with talking, pleasant affect  Skin: warm and dry, brisk capillary refill HEENT:normocephalic, sclera clear Neck:supple, no JVD currently, no bruits Heart:irreg irreg,no obvious murmur Lungs:diminished in the bases, no wheezes  Abd:+ BS, soft, non tender Ext:no edema, 2+ pedal pulses bil Neuro:alert and oriented X 3 MAE, follows commands.    Assessment/Plan Principal Problem:   Acute on chronic combined systolic and diastolic CHF, NYHA class 3 Active Problems:    CABG 1999, Cath Fen 2013 with patent grafts and distal disease -refused life vest and ICD   Ischemic cardiomyopathy, EF 20-25% by 2D Feb 2012   Ventricular tachycardia, non-sustained; secondary to Ischemic CM   Atrial fibrillation, chronic- permanent   Chronic renal disease, stage III   HYPERLIPIDEMIA   Chronic anticoagulation, secondary to Chronic a. fib.   HYPERTENSIVE CARDIOVASCULAR DISEASE   COPD (chronic obstructive pulmonary disease)   AAA (abdominal aortic aneurysm) 5.6cm by CT 7/12  PLAN: -400 currently.  No acute distress.  Agree with lasix 40 mg IV BID.  Continue coumadin.  Will check another troponin in am.  MD to see in AM. We will assume care.  HARDING,Kayton W 11/03/2012, 11:40 AM  Nada Boozer, FNP-C [facility] Pgr:[phone] 11/03/2012   I have seen and evaluated the patient this AM to follow up from Ms. Nada Boozer, NP. I agree with her  findings, examination as well as impression recommendations.  Very pleasant 79 y/o man that I spent in excess of 30 min talking to about his cardiac history.  Known ICM EF ~20% who presented with SSx c/w acute on chronic combined Systolic & Diastolic HF.  But does not seem to be extremely volume overloaded.     He is already on excellent medical therapy (BB, ARB)  -- will simply change to IV Lasix.  If BP continues to remain elevated, can add Nitrates for additional afterload reduction.   Would consider Aldactone, but need to ensure renal function is stable.  K+ 3.0 - replete today   Chronic Afib - on warfarin.  INR Therapeutic.  Ruled out for MI -- will discuss with Dr. [name], but do not anticipate invasive evaluation.  Discussed Code Status -- is ok with FC (CPR, Intubation, Defibrillation), but not prolonged life support.     Marykay Lex, M.D., M.S. [facility] [address] [place], [place]  [address]  [phone] Pager # [phone] 11/03/2012 11:40 AM

## 2012-11-02 NOTE — ED Notes (Signed)
Patient transported to X-ray 

## 2012-11-02 NOTE — H&P (Signed)
[facility] History and Physical  [name] [name] ZOX:[id number] DOB: 02-12-1926 DOA: 11/02/2012  Referring physician: Lemont [name], ED PA  PCP: [name] Heap, MD  Specialists: None currently  Chief Complaint: SOB and Inc WOB  HPI: [name] [name] is a 79 y.o. male with relatively complex med h/o as below.  He states that on 3.7.14 pm he went to bed around 21:00 hours he dozed off and went to the RR at around 21:50 and was about to go to sleep and lay there 30-45 min, and would awaken within half an hour and was SOB and this seeemd to recur all night.  He states that the SOB has worsened progressively over the last 6-8 months.  He states that he hasn;t been able to walk as far as he usually can across his living room-he thinks at best 40-50 feet, but feels more sob.  Ususe one pillow to sleep on at home.  NO Chest pain, no radiating arm pain.  No cough or cold, no fever-no sick contacts.  NOt noticed any LE swelling or stomach.  NO recent gain in weight.  Doesn;t know dry weight-last time he weighted was 2 weeks ago was 184 at [employer] office He decided to call 911 to come over to the Hospital and came in for a review. His usal oxygen level ranges from 94-99%.  He usually uses a walker to ambulate.  Emergency room workup revealed a BNP of 9157 chest x-ray two-view showed patchy interstitial chronic prominence is with potential lung scarring and maybe cephalization and potential pulmonary edema Some mild hypokalemia his basement metabolic panel concurred with prior as did CBC It was attempted to ambulate the patient in the emergency room and patient's saturations dropped into the high 70's/upper 80s and patient does not have an oxygen requirement at baseline   Review of Systems:  Other than above denies any diarrhea any dark stool tarry stool fainting spells dizzy episodes chest pain nausea vomiting fever rash  Past Medical History  Diagnosis Date  . HTN (hypertension)   . GERD (gastroesophageal  reflux disease)   . Hyperlipidemia   . Hypertensive cardiovascular disease   . CAD (coronary artery disease)   . MI (myocardial infarction) 1989  . Atrial fibrillation 1/09    s/p DCCV to NSR   . Pulmonary granuloma     left  . Ascending aortic aneurysm   . COPD (chronic obstructive pulmonary disease)   . Shortness of breath   . CAD (coronary artery disease) of bypass graft,SVG to RCA occluded, Seq. limb of SVG to PLA1 &PLA2 occluded, Seq. VG to Ramus occluded, PATENT LIMA to LAD, Patent VG to DX1, Patent Vg to PDA 10/02/11 10/02/2011  . Ischemic cardiomyopathy 10/02/2011  . CHF (congestive heart failure)    Chart review  Seen in ED and d/c 12/09/11 with possible COPD exacerbation  Admission 2.1.13 with SOB-ECHO at the time showed Ef dropped to 20-25%-no intervention perfromed-patient was offered life vest as an option and underwent L and R heart cath-was alos offered a AICD which he declined.  H/o Upper airway instability syndrome follwed by Pulm-thoguht to be 2/2 to ACE as well  Past Surgical History  Procedure Laterality Date  . Stomach surgery    . Mastoidectomy      right ear  . Cardiac catheterization  10/01/2011  . Coronary artery bypass graft  12/1994  . Pr vein bypass graft,aorto-fem-pop  1996  . Vagotomy  1973   Social History:  reports that he  quit smoking about 24 years ago. His smoking use included Cigarettes and Cigars. He has a 22.5 pack-year smoking history. He has never used smokeless tobacco. He reports that he does not drink alcohol or use illicit drugs. She lives alone at home usually on blades with a walker  Allergies  Allergen Reactions  . Ace Inhibitors     Cough  . Cimetidine     REACTION: unknown reaction, pt states per Dr. Jarold Motto  . Ciprofloxacin   . Nsaids   . Ranitidine Hcl     REACTION: unknown reaction, pt states per Dr. Jarold Motto  . Zantac (Ranitidine Hcl)     Family History  Problem Relation Age of Onset  . Liver cancer Sister   .  Hypertension Sister   . Cancer Sister   . Diabetes Sister   . Hypertension Father   . Heart disease Father   . Hyperlipidemia Father   . Heart disease Mother      Prior to Admission medications   Medication Sig Start Date End Date Taking? Authorizing Provider  allopurinol (ZYLOPRIM) 300 MG tablet Take 300 mg by mouth daily.   Yes Historical Provider, MD  atorvastatin (LIPITOR) 80 MG tablet Take 80 mg by mouth daily.   Yes Historical Provider, MD  carvedilol (COREG) 12.5 MG tablet Take 12.5 mg by mouth 2 (two) times daily with a meal.   Yes Historical Provider, MD  cilostazol (PLETAL) 50 MG tablet Take 50 mg by mouth 2 (two) times daily.    Yes Historical Provider, MD  furosemide (LASIX) 40 MG tablet Take 40 mg by mouth 2 (two) times daily. 10/08/11  Yes [name] Finlay, PA-C  LORazepam (ATIVAN) 0.5 MG tablet Take 0.5 mg by mouth every 6 (six) hours as needed. For anxiety   Yes Historical Provider, MD  Multiple Vitamin (MULTIVITAMIN) tablet Take 1 tablet by mouth daily.   Yes Historical Provider, MD  olmesartan (BENICAR) 20 MG tablet Take 10 mg by mouth daily.  10/08/11  Yes [name] Finlay, PA-C  terazosin (HYTRIN) 5 MG capsule Take 5 mg by mouth at bedtime.     Yes Historical Provider, MD  warfarin (COUMADIN) 5 MG tablet Take 2.5 mg by mouth daily.    Yes Historical Provider, MD   Physical Exam: Filed Vitals:   11/02/12 1002 11/02/12 1130 11/02/12 1230 11/02/12 1330  BP: 158/76 157/92 149/87 163/84  Pulse: 86  87 94  Temp: 97.7 F (36.5 C)     TempSrc: Oral     Resp: 19 17 20 18   SpO2: 97% 97% 97% 97%     General:  Alert pleasant oriented Caucasian male no apparent distress  Eyes: Senilis no pallor no icterus  ENT: Soft supple some JVD bilaterally no bruit  Neck: No thyromegaly noted  Cardiovascular: S1-S2 noted to be in atrial fibrillation on exam  Respiratory: Clinically clear no added sound skin changes of seborrheic keratosis on back  Abdomen: Soft nontender nondistended see  above  Skin:  See above   Musculoskeletal: Range of motion intact  Psychiatric: Euthymic   Neurologic: Grossly normal, tongue protrudes normally good strength bilaterally all 4 extremity  Labs on Admission:  Basic Metabolic Panel:  Recent Labs Lab 11/02/12 1012  NA 142  K 3.4*  CL 103  CO2 24  GLUCOSE 132*  BUN 32*  CREATININE 1.71*  CALCIUM 9.8   Liver Function Tests: No results found for this basename: AST, ALT, ALKPHOS, BILITOT, PROT, ALBUMIN,  in the last 168 hours No  results found for this basename: LIPASE, AMYLASE,  in the last 168 hours No results found for this basename: AMMONIA,  in the last 168 hours CBC:  Recent Labs Lab 11/02/12 1012  WBC 7.8  NEUTROABS 4.6  HGB 13.6  HCT 39.9  MCV 88.9  PLT 141*   Cardiac Enzymes: No results found for this basename: CKTOTAL, CKMB, CKMBINDEX, TROPONINI,  in the last 168 hours  BNP (last 3 results)  Recent Labs  11/02/12 1013  PROBNP 9157.0*   CBG: No results found for this basename: GLUCAP,  in the last 168 hours  Radiological Exams on Admission: Dg Chest 2 View  11/02/2012  *RADIOLOGY REPORT*  Clinical Data: Shortness of breath.  CHEST - 2 VIEW  Comparison: Chest x-ray 12/09/2011.  Findings: There are patchy areas of interstitial prominence and architectural distortion and scattered throughout the mid and lower lungs bilaterally, which is similar to but slightly increased compared to the prior examination from 12/09/2011.  Patchy ill- defined bibasilar opacities are also noted. Blunting of both costophrenic sulci.  Pulmonary vasculature appears mildly engorged. Mild cardiomegaly is unchanged. The patient is rotated to the right on today's exam, resulting in distortion of the mediastinal contours and reduced diagnostic sensitivity and specificity for mediastinal pathology.  Atherosclerosis in the thoracic aorta. Status post median sternotomy for CABG.  Multiple surgical clips near the gastroesophageal junction.   IMPRESSION: 1.  Overall, the appearance of chest is very similar to the prior examination, with a background of patchy diffuse interstitial prominence which appears to be chronic and likely indicative of some degree of multifocal scarring and/or underlying interstitial lung disease.  In addition, there does appear to be some cephalization of the pulmonary vasculature, given the presence of cardiomegaly, superimposed interstitial pulmonary edema related to congestive heart failure is possible. 2.  There are likely small bilateral pleural effusions (versus chronic pleural scarring). 3.  Atherosclerosis. 4.  Postoperative changes, as above.   Original Report Authenticated By: Trudie Reed, M.D.     EKG: Independently reviewed. It showed fibrillation with PACs/PVCs no acute ST-T wave changes compared to prior EKG done 12/09/2011  Assessment/Plan Principal Problem:   Acute on chronic systolic CHF (congestive heart failure), NYHA class 3 Active Problems:   HYPERTENSIVE CARDIOVASCULAR DISEASE    CABG 1999, Cath Fen 2013 with patent grafts and distal disease    Atrial fibrillation, chronic- permanent   Ischemic cardiomyopathy, EF 20-25% by 2D Feb 2012   Chronic renal disease, stage III   AAA (abdominal aortic aneurysm) 5.6cm by CT 7/12   1. Likely acute on chronic systolic CHF NYHA class III to 4-patient received a dose of IV Lasix 40 mg in the emergency room which I will convert to IV Lasix twice a day 40 mg. Consulted [facility] given the fact that he may be running out of options with cardiorenal syndrome. For now I've cut back his Coreg to 6.25 mg from 12.5 twice a day given he has an acute exacerbation. Place Foley. Daily weights. Strict is and os-defer to cardiology whether to repeat echo at this stage. 2. Acute respiratory failure secondary to #1-continue oxygen. Ambulate to determine sats subsequent to diuresis 3. Atrial fibrillation, CHad2vasc=4-Coumadin per pharmacy.  Patient has been seen in the past in 09/2011 by cardiology group for recommendations and was recommended a life vest and a defibrillator which he declined at that time 4. History of ascending aortic aneurysm noted on CT scan 7/12-patient probably needs interval surveillance as an outpatient  q. yearly 5. CAD history-continue Pletal 50 mg twice a day, already on Coumadin therefore would not add aspirin 6. Hyperlipidemia-continue Lipitor 80 mg daily 7. Hypertension substitute avapro 4 home ARB med 8. Cyclic cough syndrome-probably related to ACE inhibitor and has been seen by pulmonary in the past-no reason to suggest that this is a pulmonary process at present. Recommend outpatient followup for abnormal chest x-ray versus followup chest x-ray 9. CKD stage III-continue Lasix. 10. History of chronic gastritis/reflux disease-stable not on PPI  Spoke with [facility]  Code Status: Full  Family Communication: spoke with son and relative at bedsdie  Disposition Plan: inpatient, tele  Time spent: 57  Mahala Menghini [facility] Pager [phone]  If 7PM-7AM, please contact night-coverage [email or web address] Password [facility] 11/02/2012, 1:54 PM

## 2012-11-02 NOTE — ED Provider Notes (Signed)
Medical screening examination/treatment/procedure(s) were conducted as a shared visit with non-physician practitioner(s) and myself.  I personally evaluated the patient during the encounter. Patient with shortness of breath. CHF exacerbation. Sats will drop to 78 with ambulation. Patient be admitted to  [facility]. Rubin Payor, MD 11/02/12 [id number]

## 2012-11-02 NOTE — ED Provider Notes (Signed)
History     CSN: [id number]  Arrival date & time 11/02/12  4098   First MD Initiated Contact with Patient 11/02/12 0957      Chief Complaint  Patient presents with  . Shortness of Breath    (Consider location/radiation/quality/duration/timing/severity/associated sxs/prior treatment) Patient is a 79 y.o. male presenting with shortness of breath. The history is provided by the patient.  Shortness of Breath Associated symptoms: cough   Associated symptoms: no abdominal pain, no chest pain, no diaphoresis, no fever and no vomiting   Pt with an extensive PMH including COPD, afib, CAD, and CHF presented to the ED this morning after having difficulty breathing last night.  He states he awoke from sleep around 12pm having some difficulty breathing and heard some crackling when he was trying to breath.  Nothing made it better or worse.  Denies having chest pain at that time.  Has mild nonproductive cough.  No recent illness. Denies fever.  As of this morning he feels like his normal self but still wanted to get checked out.    Past Medical History  Diagnosis Date  . HTN (hypertension)   . GERD (gastroesophageal reflux disease)   . Hyperlipidemia   . Hypertensive cardiovascular disease   . CAD (coronary artery disease)   . MI (myocardial infarction) 1989  . Atrial fibrillation 1/09    s/p DCCV to NSR   . Pulmonary granuloma     left  . Ascending aortic aneurysm   . COPD (chronic obstructive pulmonary disease)   . Shortness of breath   . CAD (coronary artery disease) of bypass graft,SVG to RCA occluded, Seq. limb of SVG to PLA1 &PLA2 occluded, Seq. VG to Ramus occluded, PATENT LIMA to LAD, Patent VG to DX1, Patent Vg to PDA 10/02/11 10/02/2011  . Ischemic cardiomyopathy 10/02/2011  . CHF (congestive heart failure)     Past Surgical History  Procedure Laterality Date  . Stomach surgery    . Mastoidectomy      right ear  . Cardiac catheterization  10/01/2011  . Coronary artery bypass graft   12/1994  . Pr vein bypass graft,aorto-fem-pop  1996  . Vagotomy  1973    Family History  Problem Relation Age of Onset  . Liver cancer Sister   . Hypertension Sister   . Cancer Sister   . Diabetes Sister   . Hypertension Father   . Heart disease Father   . Hyperlipidemia Father   . Heart disease Mother     History  Substance Use Topics  . Smoking status: Former Smoker -- 0.50 packs/day for 45 years    Types: Cigarettes, Cigars    Quit date: 12/26/1987  . Smokeless tobacco: Never Used  . Alcohol Use: No     Comment: Occas Beer      Review of Systems  Constitutional: Negative for fever, chills, diaphoresis, activity change and appetite change.  HENT: Negative for congestion.   Respiratory: Positive for cough and shortness of breath. Negative for chest tightness.   Cardiovascular: Positive for palpitations. Negative for chest pain.  Gastrointestinal: Negative for nausea, vomiting, abdominal pain and abdominal distention.  Musculoskeletal: Positive for joint swelling and arthralgias.  Neurological: Negative for syncope.  All other systems reviewed and are negative.    Allergies  Ace inhibitors; Cimetidine; and Ranitidine hcl  Home Medications   Current Outpatient Rx  Name  Route  Sig  Dispense  Refill  . EXPIRED: carvedilol (COREG) 6.25 MG tablet   Oral  Take 1 tablet (6.25 mg total) by mouth 2 (two) times daily with a meal.   60 tablet   5   . cilostazol (PLETAL) 50 MG tablet   Oral   Take 50 mg by mouth 1 day or 1 dose.         Marland Kitchen EXPIRED: furosemide (LASIX) 40 MG tablet   Oral   Take 40 mg by mouth 2 (two) times daily.         Marland Kitchen LORazepam (ATIVAN) 0.5 MG tablet   Oral   Take 0.5 mg by mouth every 6 (six) hours as needed. For anxiety         . Multiple Vitamin (MULTIVITAMIN) tablet   Oral   Take 1 tablet by mouth daily.         Marland Kitchen EXPIRED: olmesartan (BENICAR) 20 MG tablet   Oral   Take 20 mg by mouth daily.         Marland Kitchen spironolactone  (ALDACTONE) 25 MG tablet   Oral   Take 25 mg by mouth daily. 1/2 tab of 25 mg po daily         . terazosin (HYTRIN) 5 MG capsule   Oral   Take 5 mg by mouth at bedtime.           Marland Kitchen warfarin (COUMADIN) 5 MG tablet   Oral   Take 2.5 mg by mouth daily.            BP 158/76  Pulse 86  Temp(Src) 97.7 F (36.5 C) (Oral)  Resp 19  SpO2 97%  Physical Exam  Nursing note and vitals reviewed. Constitutional: He is oriented to person, place, and time. He appears well-developed and well-nourished. No distress.  Eyes: EOM are normal. Pupils are equal, round, and reactive to light.  Cardiovascular: Normal rate.   In chronic afib  Pulmonary/Chest: Effort normal and breath sounds normal. No respiratory distress. He has no wheezes. He has no rales. He exhibits no tenderness.  Neurological: He is alert and oriented to person, place, and time.  Skin: Skin is warm and dry. He is not diaphoretic.  Psychiatric: He has a normal mood and affect.    ED Course  Procedures (including critical care time)  Labs Reviewed  CBC WITH DIFFERENTIAL  BASIC METABOLIC PANEL  PRO B NATRIURETIC PEPTIDE  PROTIME-INR   Dg Chest 2 View  11/02/2012  *RADIOLOGY REPORT*  Clinical Data: Shortness of breath.  CHEST - 2 VIEW  Comparison: Chest x-ray 12/09/2011.  Findings: There are patchy areas of interstitial prominence and architectural distortion and scattered throughout the mid and lower lungs bilaterally, which is similar to but slightly increased compared to the prior examination from 12/09/2011.  Patchy ill- defined bibasilar opacities are also noted. Blunting of both costophrenic sulci.  Pulmonary vasculature appears mildly engorged. Mild cardiomegaly is unchanged. The patient is rotated to the right on today's exam, resulting in distortion of the mediastinal contours and reduced diagnostic sensitivity and specificity for mediastinal pathology.  Atherosclerosis in the thoracic aorta. Status post median  sternotomy for CABG.  Multiple surgical clips near the gastroesophageal junction.  IMPRESSION: 1.  Overall, the appearance of chest is very similar to the prior examination, with a background of patchy diffuse interstitial prominence which appears to be chronic and likely indicative of some degree of multifocal scarring and/or underlying interstitial lung disease.  In addition, there does appear to be some cephalization of the pulmonary vasculature, given the presence of cardiomegaly, superimposed interstitial pulmonary  edema related to congestive heart failure is possible. 2.  There are likely small bilateral pleural effusions (versus chronic pleural scarring). 3.  Atherosclerosis. 4.  Postoperative changes, as above.   Original Report Authenticated By: Trudie Reed, M.D.    Orders placed during the hospital encounter of 11/02/12  . EKG 12-LEAD  . EKG 12-LEAD     Date: 11/02/2012  Rate: 98  Rhythm: atrial fibrillation  QRS Axis: normal  Intervals: QT prolonged  ST/T Wave abnormalities: nonspecific T wave changes  Conduction Disutrbances:none  Narrative Interpretation:   Old EKG Reviewed: changes noted and new PVC    1. CHF (congestive heart failure)   2. Hypoxia      MDM  Pt is an 79yo male with an extensive cardiac hx including afib and CHF.  Cardiac workup was performed in ED.  Labs: BNP was increased from baseline.  All other labs were "normal" for this pt.   CXR: possible interstitial pulmonary edema, likely small bilateral pleural effusions.  Pt continues to say he feels okay now.  He is not having difficulty breathing.  Will give 40mg  Lasix in ED.    Performed ambulatory pulse O2, O2Sat dropped to 78%.  Admitted to [facility].       Junius Finner, PA-C 11/02/12 1505

## 2012-11-03 LAB — COMPREHENSIVE METABOLIC PANEL
AST: 17 U/L (ref 0–37)
Albumin: 3.4 g/dL — ABNORMAL LOW (ref 3.5–5.2)
Alkaline Phosphatase: 215 U/L — ABNORMAL HIGH (ref 39–117)
BUN: 33 mg/dL — ABNORMAL HIGH (ref 6–23)
CO2: 25 mEq/L (ref 19–32)
Chloride: 102 mEq/L (ref 96–112)
Potassium: 3 mEq/L — ABNORMAL LOW (ref 3.5–5.1)
Total Bilirubin: 1.2 mg/dL (ref 0.3–1.2)

## 2012-11-03 LAB — CBC
MCH: 30.4 pg (ref 26.0–34.0)
MCV: 88.7 fL (ref 78.0–100.0)
Platelets: 137 10*3/uL — ABNORMAL LOW (ref 150–400)
RDW: 14 % (ref 11.5–15.5)

## 2012-11-03 MED ORDER — POTASSIUM CHLORIDE CRYS ER 20 MEQ PO TBCR
20.0000 meq | EXTENDED_RELEASE_TABLET | Freq: Two times a day (BID) | ORAL | Status: DC
  Administered 2012-11-03 – 2012-11-06 (×6): 20 meq via ORAL
  Filled 2012-11-03 (×7): qty 1

## 2012-11-03 MED ORDER — POTASSIUM CHLORIDE CRYS ER 20 MEQ PO TBCR
40.0000 meq | EXTENDED_RELEASE_TABLET | Freq: Once | ORAL | Status: AC
  Administered 2012-11-03: 40 meq via ORAL
  Filled 2012-11-03: qty 2

## 2012-11-03 MED ORDER — HYDROCODONE-ACETAMINOPHEN 5-325 MG PO TABS
1.0000 | ORAL_TABLET | Freq: Four times a day (QID) | ORAL | Status: DC | PRN
  Administered 2012-11-03 – 2012-11-06 (×4): 2 via ORAL
  Filled 2012-11-03 (×4): qty 2

## 2012-11-03 NOTE — Progress Notes (Addendum)
ANTICOAGULATION CONSULT NOTE - Follow Up Consult  Pharmacy Consult for Coumadin Indication: atrial fibrillation  Patient Weight: 82.7 kg  Labs:  Recent Labs  11/02/12 1012 11/03/12 0510  HGB 13.6 12.4*  HCT 39.9 36.2*  PLT 141* 137*  LABPROT 21.9* 22.9*  INR 2.00* 2.13*  CREATININE 1.71* 1.68*   Lab Results  Component Value Date   INR 2.13* 11/03/2012   INR 2.00* 11/02/2012   Medications:  Prescriptions prior to admission  Medication Sig Dispense Refill  . acetaminophen (TYLENOL) 500 MG tablet Take 1,000 mg by mouth every 6 (six) hours as needed for pain.      Marland Kitchen allopurinol (ZYLOPRIM) 300 MG tablet Take 300 mg by mouth daily.      Marland Kitchen atorvastatin (LIPITOR) 80 MG tablet Take 80 mg by mouth daily.      . carvedilol (COREG) 12.5 MG tablet Take 12.5 mg by mouth 2 (two) times daily.       . cilostazol (PLETAL) 50 MG tablet Take 50 mg by mouth 2 (two) times daily.       . furosemide (LASIX) 40 MG tablet Take 40 mg by mouth 2 (two) times daily.      Marland Kitchen LORazepam (ATIVAN) 0.5 MG tablet Take 0.5 mg by mouth at bedtime as needed for anxiety (to help sleep). For anxiety      . Multiple Vitamin (MULTIVITAMIN WITH MINERALS) TABS Take 1 tablet by mouth daily.      Marland Kitchen olmesartan (BENICAR) 20 MG tablet Take 10 mg by mouth daily.       Marland Kitchen terazosin (HYTRIN) 5 MG capsule Take 5 mg by mouth at bedtime.        Marland Kitchen warfarin (COUMADIN) 5 MG tablet Take 2.5 mg by mouth daily.        Assessment:  79 y/o male on chronic Coumadin for atrial fibrillation now admitted with acute exacerbation of CHF.  Coumadin continued with Pharmacy management.  INR therapeutic on home schedule.  Goal of Therapy:  INR 2-3\   Plan:  Continue Coumadin 2.5 mg daily.  Daily INR's, CBC.    Stramoski, Deetta Perla.D 11/03/2012, 11:40 AM   Addendum:  Patient placed on Lovenox 30 mg sq q 24 hours for VTE prophylaxis.  Patient has had a stable Therapeutic INR on home schedule of Coumadin.  Lovenox is not needed at  this point.  PLAN:  Lovenox discontinued due to stable INR.  Thank you. Stramoski, Elisha Headland,  Pharm.D. , 11:53 AM

## 2012-11-03 NOTE — Progress Notes (Addendum)
Subjective:  Breathing much better this AM. Had some "issues" with sleeping last PM due to distractions. Able to get to bathroom with assistance, but lacks strength to do so by himself. Will need rolling walker.  Objective:  Vital Signs in the last 24 hours: Temp:  [96.6 F (35.9 C)-97.6 F (36.4 C)] 97.6 F (36.4 C) (03/09 0508) Pulse Rate:  [82-108] 82 (03/09 0508) Resp:  [13-20] 18 (03/09 0508) BP: (128-170)/(63-101) 142/72 mmHg (03/09 0947) SpO2:  [94 %-97 %] 97 % (03/09 0508) Weight:  [82.7 kg (182 lb 5.1 oz)-84.46 kg (186 lb 3.2 oz)] 82.7 kg (182 lb 5.1 oz) (03/08 1803)  Intake/Output from previous day: 03/08 0701 - 03/09 0700 In: -  Out: 700 [Urine:700] Intake/Output from this shift: Total I/O In: -  Out: 200 [Urine:200]  Physical Exam: General appearance: alert, cooperative, appears stated age and no distress Neck: JVD - 8 cm above sternal notch and no carotid bruit Lungs: clear to auscultation bilaterally, normal percussion bilaterally and non-labored Heart: irregularly irregular rhythm and S2: fixed splitting Abdomen: soft, non-tender; bowel sounds normal; no masses,  no organomegaly Extremities: extremities normal, atraumatic, no cyanosis or edema Pulses: 2+ and symmetric Neurologic: Grossly normal  Lab Results:  Recent Labs  11/02/12 1012 11/03/12 0510  WBC 7.8 8.5  HGB 13.6 12.4*  PLT 141* 137*    Recent Labs  11/02/12 1012 11/03/12 0510  NA 142 142  K 3.4* 3.0*  CL 103 102  CO2 24 25  GLUCOSE 132* 126*  BUN 32* 33*  CREATININE 1.71* 1.68*    Recent Labs  11/03/12 0500  TROPONINI <0.30   Hepatic Function Panel  Recent Labs  11/03/12 0510  PROT 6.5  ALBUMIN 3.4*  AST 17  ALT 42  ALKPHOS 215*  BILITOT 1.2   Imaging: No new images  Cardiac Studies:  Assessment/Plan:  Principal Problem:   Acute on chronic combined systolic and diastolic CHF, NYHA class 3 Active Problems:    CABG 1999, Cath Fen 2013 with patent grafts and  distal disease -refused life vest and ICD   Ischemic cardiomyopathy, EF 20-25% by 2D Feb 2012   Ventricular tachycardia, non-sustained; secondary to Ischemic CM   Atrial fibrillation, chronic- permanent   Chronic renal disease, stage III   HYPERLIPIDEMIA   Chronic anticoagulation, secondary to Chronic a. fib.   HYPERTENSIVE CARDIOVASCULAR DISEASE   COPD (chronic obstructive pulmonary disease)   AAA (abdominal aortic aneurysm) 5.6cm by CT 7/12  Plan = continue IV Lasix today & at least in AM tomorrow before converting to PO.  Consider adding Spironolactone if renal fxn stable.  On stable BB & ARB dose.  Consider Nitrate if additional pre/afterload reduction desired.  OOB with assistance today & ambulate in hall with O2 monitor tomorrow.    Continue Warfarin - INR is therapeutic.  FC. [facility] will take over.  Appreciate [facility] assistance with admission.    LOS: 1 day    [name] [name],[name] [name] 11/03/2012, 11:46 AM

## 2012-11-04 ENCOUNTER — Encounter (HOSPITAL_COMMUNITY): Payer: Medicare Other

## 2012-11-04 LAB — PROTIME-INR
INR: 2.46 — ABNORMAL HIGH (ref 0.00–1.49)
Prothrombin Time: 25.5 seconds — ABNORMAL HIGH (ref 11.6–15.2)

## 2012-11-04 LAB — BASIC METABOLIC PANEL
BUN: 31 mg/dL — ABNORMAL HIGH (ref 6–23)
CO2: 27 mEq/L (ref 19–32)
Calcium: 9 mg/dL (ref 8.4–10.5)
Chloride: 101 mEq/L (ref 96–112)
Creatinine, Ser: 1.74 mg/dL — ABNORMAL HIGH (ref 0.50–1.35)
GFR calc Af Amer: 40 mL/min — ABNORMAL LOW (ref >90)

## 2012-11-04 MED ORDER — ACETAMINOPHEN 500 MG PO TABS: 500.0000 mg | ORAL_TABLET | Freq: Four times a day (QID) | ORAL | Status: DC | PRN

## 2012-11-04 MED ORDER — ACETAMINOPHEN 500 MG PO TABS
500.0000 mg | ORAL_TABLET | Freq: Four times a day (QID) | ORAL | Status: DC | PRN
Start: 2012-11-04 — End: 2012-11-06
  Administered 2012-11-04: 1000 mg via ORAL
  Administered 2012-11-05: 500 mg via ORAL
  Filled 2012-11-04 (×2): qty 2

## 2012-11-04 NOTE — Progress Notes (Signed)
Pt. Seen and examined. Agree with the NP/PA-C note as written. Breathing is "perhaps a little better" .Marland Kitchen Creatinine has improved. Continue IV lasix.  PT recommends a nursing facility due to inability to ambulate. EF is quite low without likelihood of improvement. Rhythm is chronic a-fib. Case manager involved, hopefully can assist with nursing home options.  Chrystie Nose, MD, [facility] Attending Cardiologist [facility]

## 2012-11-04 NOTE — Progress Notes (Signed)
Patient evaluated for long-term disease management services with [facility] Care Management Program as a benefit of his [employer] Medicare insurance. Spoke with patient at bedside along with his daughter to explain [facility] Care Management services. Consents signed. However, patient reports he lives alone and is unable to walk. Patient may need rehab post discharge. Daughter confirms that patient likely not able to manage at home alone. Left [facility] Care Management packet and contact information with daughter, Casandra Doffing, cell [phone]. Patient will receive a post discharge transition of care call and monthly home visits for assessments and for education if he should go home post discharge.      Arvella Merles, RN,BSN, [facility], [phone]

## 2012-11-04 NOTE — Progress Notes (Signed)
ANTICOAGULATION CONSULT NOTE - Follow Up Consult  Pharmacy Consult for Coumadin Indication: Atrial Fibrillation   Allergies  Allergen Reactions  . Ace Inhibitors     Cough  . Cimetidine      unknown reaction, pt states per Dr. Jarold Motto  . Ciprofloxacin Other (See Comments)    unknown  . Nsaids     Dr does not want patient to take due to kidneys  . Zantac (Ranitidine Hcl)     Hepatitis per Dr. Jarold Motto    Patient Measurements: Height: 5\' 10"  (177.8 cm) Weight: 177 lb 0.5 oz (80.3 kg) IBW/kg (Calculated) : 73  Vital Signs: Temp: 98.2 F (36.8 C) (03/10 0440) Temp src: Oral (03/10 0440) BP: 125/70 mmHg (03/10 0440) Pulse Rate: 91 (03/10 0440)  Labs:  Recent Labs  11/02/12 1012 11/03/12 0500 11/03/12 0510 11/04/12 0730  HGB 13.6  --  12.4*  --   HCT 39.9  --  36.2*  --   PLT 141*  --  137*  --   LABPROT 21.9*  --  22.9* 25.5*  INR 2.00*  --  2.13* 2.46*  CREATININE 1.71*  --  1.68*  --   TROPONINI  --  <0.30  --   --     Estimated Creatinine Clearance: 33.8 ml/min (by C-G formula based on Cr of 1.68).   Medications:  . allopurinol  300 mg Oral Daily  . atorvastatin  80 mg Oral q1800  . carvedilol  6.25 mg Oral BID WC  . cilostazol  50 mg Oral BID  . furosemide  40 mg Intravenous BID  . irbesartan  150 mg Oral Daily  . multivitamin with minerals  1 tablet Oral Daily  . potassium chloride  20 mEq Oral BID  . [COMPLETED] potassium chloride  40 mEq Oral Once  . sodium chloride  3 mL Intravenous Q12H  . terazosin  5 mg Oral QHS  . warfarin  2.5 mg Oral q1800  . [DISCONTINUED] enoxaparin (LOVENOX) injection  30 mg Subcutaneous Q24H    Assessment: 79 yo male on chronic Coumadin for atrial fibrillation admitted for an acute exacerbation of CHF.  Home dose of Coumadin is 2.5mg  daily.  INR 2.0 on admission and patient was continued on home dose.   INR 2.46 today. Scr 1.68 and CrCl 33.65ml/min. H/H 12.4/36.2 and plts 137. Will continue home dose of Coumadin.    Goal of Therapy:  INR 2-3 Monitor platelets by anticoagulation protocol: Yes   Plan:  Continue Coumadin 2.5mg  daily  Daily PT/INR  Micheline Chapman PharmD Candidate 11/04/2012,9:12 AM

## 2012-11-04 NOTE — Progress Notes (Signed)
[facility]  Subjective: He reports moderate improvement in breathing.   Objective: Vital signs in last 24 hours: Temp:  [98.2 F (36.8 C)] 98.2 F (36.8 C) (03/10 0440) Pulse Rate:  [91-102] 91 (03/10 0440) Resp:  [18] 18 (03/10 0440) BP: (125-134)/(59-81) 125/70 mmHg (03/10 0440) SpO2:  [96 %] 96 % (03/10 0440) Weight:  [177 lb 0.5 oz (80.3 kg)] 177 lb 0.5 oz (80.3 kg) (03/10 0440) Last BM Date: 11/02/12  Intake/Output from previous day: 03/09 0701 - 03/10 0700 In: -  Out: 1400 [Urine:1400] Intake/Output this shift: Total I/O In: 240 [P.O.:240] Out: 125 [Urine:125]  Medications Current Facility-Administered Medications  Medication Dose Route Frequency Alyxis Grippi Last Rate Last Dose  . allopurinol (ZYLOPRIM) tablet 300 mg  300 mg Oral Daily Rhetta Mura, MD   300 mg at 11/03/12 0943  . atorvastatin (LIPITOR) tablet 80 mg  80 mg Oral q1800 Rhetta Mura, MD   80 mg at 11/03/12 1816  . carvedilol (COREG) tablet 6.25 mg  6.25 mg Oral BID WC Rhetta Mura, MD   6.25 mg at 11/04/12 4540  . cilostazol (PLETAL) tablet 50 mg  50 mg Oral BID Rhetta Mura, MD   50 mg at 11/03/12 2250  . furosemide (LASIX) injection 40 mg  40 mg Intravenous BID Rhetta Mura, MD   40 mg at 11/04/12 9811  . HYDROcodone-acetaminophen (NORCO/VICODIN) 5-325 MG per tablet 1-2 tablet  1-2 tablet Oral Q6H PRN Nada Boozer, NP   2 tablet at 11/03/12 0044  . irbesartan (AVAPRO) tablet 150 mg  150 mg Oral Daily Rhetta Mura, MD   150 mg at 11/03/12 0943  . LORazepam (ATIVAN) tablet 0.5 mg  0.5 mg Oral QHS PRN Rhetta Mura, MD      . multivitamin with minerals tablet 1 tablet  1 tablet Oral Daily Rhetta Mura, MD   1 tablet at 11/03/12 0943  . potassium chloride SA (K-DUR,KLOR-CON) CR tablet 20 mEq  20 mEq Oral BID Marykay Lex, MD   20 mEq at 11/03/12 2249  . sodium chloride 0.9 % injection 3 mL  3 mL Intravenous Q12H Rhetta Mura, MD   3 mL at 11/04/12 0833  . terazosin (HYTRIN) capsule 5 mg  5 mg Oral QHS Rhetta Mura, MD   5 mg at 11/03/12 2248  . warfarin (COUMADIN) tablet 2.5 mg  2.5 mg Oral q1800 Rhetta Mura, MD   2.5 mg at 11/03/12 1816    PE: General appearance: alert, cooperative and no distress Lungs: clear to auscultation bilaterally Heart: irregularly irregular rhythm Extremities: trace LEE Pulses: 2+ and symmetric Skin: warm and dry Neurologic: Grossly normal  Lab Results:   Recent Labs  11/02/12 1012 11/03/12 0510  WBC 7.8 8.5  HGB 13.6 12.4*  HCT 39.9 36.2*  PLT 141* 137*   BMET  Recent Labs  11/02/12 1012 11/03/12 0510  NA 142 142  K 3.4* 3.0*  CL 103 102  CO2 24 25  GLUCOSE 132* 126*  BUN 32* 33*  CREATININE 1.71* 1.68*  CALCIUM 9.8 9.1   PT/INR  Recent Labs  11/02/12 1012 11/03/12 0510 11/04/12 0730  LABPROT 21.9* 22.9* 25.5*  INR 2.00* 2.13* 2.46*    Assessment/Plan  Principal Problem:   Acute on chronic combined systolic and diastolic CHF, NYHA class 3 Active Problems:   HYPERLIPIDEMIA   HYPERTENSIVE CARDIOVASCULAR DISEASE    CABG 1999, Cath Fen 2013 with patent grafts and distal disease -refused life vest and ICD  Atrial fibrillation, chronic- permanent   Chronic anticoagulation, secondary to Chronic a. fib.   Ischemic cardiomyopathy, EF 20-25% by 2D Feb 2012   Ventricular tachycardia, non-sustained; secondary to Ischemic CM   Chronic renal disease, stage III   COPD (chronic obstructive pulmonary disease)   AAA (abdominal aortic aneurysm) 5.6cm by CT 7/12  Plan: Admitted for A/C combined systolic/diastaolic CHF. He diuresed 1.4L in past 24 hrs. Plan to continue w/ IV Lasix today and possibly switch to PO tomorrow. Labs for today are pending. Cr was trending downward yesterday at 1.68 (1.71 2 days ago). He has had some hypokalemia this hospitalization.  Continue with maintenance KCl dose of 20 mEq BID.  Will check labs when complete.  Will give extra K+ if needed. BP is stable. Rhythm is a-fib. Rate is controlled in the low 90's. He had 3-4 beat NSVT on telemetry overnight.  He has ICM w/ an EF of 15-20%. He does not desire an ICD. Consider adding spironolactone. Continue w/ BB and ARB.     LOS: 2 days    Brittainy M. Sharol Harness, PA-C 11/04/2012 11:11 AM

## 2012-11-04 NOTE — Care Management Note (Signed)
    Page 1 of 1   11/06/2012     3:17:59 PM   CARE MANAGEMENT NOTE 11/06/2012  Patient:  [name] [name], [name] [name]   Account Number:  [id number]  Date Initiated:  11/04/2012  Documentation initiated by:  AMERSON,JULIE  Subjective/Objective Assessment:   PT ADM ON 11/02/12 WITH CHF EXACERBATION.  PTA, PT LIVES AT HOME ALONE, AND AMBULATES WITH RW.     Action/Plan:   PT STATES HE IS HAVING TROUBLE WALKING NOW.  NEEDS PT/OT EVAL TO EVALUATE FOR HOME NEEDS.  WOULD BENEFIT FROM HHRN FOR CHF FOLLOW UP AT DISCHARGE.  WILL FOLLOW.   Anticipated DC Date:  11/06/2012   Anticipated DC Plan:  SKILLED NURSING FACILITY  In-house referral  Clinical Social Worker      DC Planning Services  CM consult      Choice offered to / List presented to:             Status of service:  Completed, signed off Medicare Important Message given?   (If response is "NO", the following Medicare IM given date fields will be blank) Date Medicare IM given:   Date Additional Medicare IM given:    Discharge Disposition:  SKILLED NURSING FACILITY  Per UR Regulation:  Reviewed for med. necessity/level of care/duration of stay  If discussed at Long Length of Stay Meetings, dates discussed:    Comments:  11/06/12 JULIE AMERSON,RN,BSN [phone] PT DISCHARGING TO [facility] SNF TODAY, PER PT/OT RECOMMENDATIONS.

## 2012-11-04 NOTE — Evaluation (Signed)
Physical Therapy Evaluation Patient Details Name: GERSON FAUTH MRN: [id number] DOB: [birth year] Today's Date: 11/04/2012 Time: 7829-5621 PT Time Calculation (min): 37 min  PT Assessment / Plan / Recommendation Clinical Impression  Patient is an 79 yo male admitted with CHF exacerbation.  Patient reports he has been getting weaker and can no longer walk.  Required mod assist to sit EOB.  Will benefit from acute PT to maximize independence prior to discharge.  Patient lives alone.  Recommend SNF for continued therapy at discharge.    PT Assessment  Patient needs continued PT services    Follow Up Recommendations  SNF    Does the patient have the potential to tolerate intense rehabilitation      Barriers to Discharge Decreased caregiver support      Equipment Recommendations  Rolling walker with 5" wheels    Recommendations for Other Services     Frequency Min 3X/week    Precautions / Restrictions Precautions Precautions: Fall Restrictions Weight Bearing Restrictions: No   Pertinent Vitals/Pain Pain 10/10 limiting mobility      Mobility  Bed Mobility Bed Mobility: Rolling Left;Left Sidelying to Sit;Sit to Supine Rolling Left: 3: Mod assist;With rail Left Sidelying to Sit: 2: Max assist;With rails;HOB elevated Sit to Supine: 3: Mod assist;HOB elevated Details for Bed Mobility Assistance: Verbal cues for rolling using rail.  Unable to roll completely onto right side due to shoulder pain.  Assist to raise trunk from bed.  Patient sat at EOB x 12 minutes.  Assist to move LE's onto bed to return to supine. Transfers Transfers: Not assessed (Patient declined due to pain in Rt. foot)    Exercises     PT Diagnosis: Difficulty walking;Generalized weakness;Acute pain  PT Problem List: Decreased strength;Decreased range of motion;Decreased activity tolerance;Decreased balance;Decreased mobility;Decreased knowledge of use of DME;Cardiopulmonary status limiting activity;Pain PT  Treatment Interventions: DME instruction;Gait training;Functional mobility training;Therapeutic exercise;Balance training;Patient/family education   PT Goals Acute Rehab PT Goals PT Goal Formulation: With patient Time For Goal Achievement: 11/18/12 Potential to Achieve Goals: Good Pt will Roll Supine to Right Side: with modified independence;with rail PT Goal: Rolling Supine to Right Side - Progress: Goal set today Pt will go Supine/Side to Sit: with min assist;with HOB 0 degrees PT Goal: Supine/Side to Sit - Progress: Goal set today Pt will go Sit to Supine/Side: with min assist;with HOB 0 degrees PT Goal: Sit to Supine/Side - Progress: Goal set today Pt will go Sit to Stand: with min assist;with upper extremity assist PT Goal: Sit to Stand - Progress: Goal set today Pt will Transfer Bed to Chair/Chair to Bed: with min assist PT Transfer Goal: Bed to Chair/Chair to Bed - Progress: Goal set today Pt will Ambulate: 16 - 50 feet;with min assist;with rolling walker PT Goal: Ambulate - Progress: Goal set today  Visit Information  Last PT Received On: 11/04/12 Assistance Needed: +2    Subjective Data  Subjective: Patient very talkative.  (About wife who died 4 years ago).  "I can't get up.  They (nursing) tried earlier and I flopped back on the bed." Patient Stated Goal: To get stronger.   Prior Functioning  Home Living Lives With: Alone Available Help at Discharge: Skilled Nursing Facility Prior Function Level of Independence: Independent Able to Take Stairs?: Yes Driving: Yes Vocation: Retired Musician: No difficulties    Copywriter, advertising Overall Cognitive Status: Appears within functional limits for tasks assessed/performed Arousal/Alertness: Awake/alert Orientation Level: Appears intact for tasks assessed Behavior During Session:  WFL for tasks performed    Extremity/Trunk Assessment Right Upper Extremity Assessment RUE ROM/Strength/Tone:  Deficits;Unable to fully assess;Due to pain RUE ROM/Strength/Tone Deficits: Strength 3/5; Decreased ROM shoulder due to pain. RUE Sensation: WFL - Light Touch Left Upper Extremity Assessment LUE ROM/Strength/Tone: WFL for tasks assessed LUE Sensation: WFL - Light Touch Right Lower Extremity Assessment RLE ROM/Strength/Tone: Deficits RLE ROM/Strength/Tone Deficits: Strength grossly 3+/5 RLE Sensation: WFL - Light Touch RLE Coordination: Deficits RLE Coordination Deficits: decreased coordination with heel-to-shin Left Lower Extremity Assessment LLE ROM/Strength/Tone: Deficits LLE ROM/Strength/Tone Deficits: Strength grossly 4-/5 LLE Sensation: WFL - Light Touch LLE Coordination: Deficits LLE Coordination Deficits: decreased coordination with heel-to-shin   Balance Balance Balance Assessed: Yes Static Sitting Balance Static Sitting - Balance Support: Bilateral upper extremity supported;Feet supported Static Sitting - Level of Assistance: 4: Min assist Static Sitting - Comment/# of Minutes: 12 minutes.  Patient required assist to maintain balance initially.  Then able to maintain balance with standby assist x 3 minutes.  End of Session PT - End of Session Activity Tolerance: Patient limited by fatigue;Patient limited by pain Patient left: in bed;with call bell/phone within reach;with bed alarm set;with family/visitor present Nurse Communication: Mobility status;Need for lift equipment  GP     Vena Austria 11/04/2012, 7:16 PM Durenda Hurt. Renaldo Fiddler, [facility] Acute Rehab Services Pager [phone]

## 2012-11-04 NOTE — Progress Notes (Signed)
Utilization Review Completed.Dowell, [name] [name] T3/05/2013  

## 2012-11-05 ENCOUNTER — Encounter (HOSPITAL_COMMUNITY): Payer: Medicare Other

## 2012-11-05 LAB — PROTIME-INR: Prothrombin Time: 28.8 seconds — ABNORMAL HIGH (ref 11.6–15.2)

## 2012-11-05 MED ORDER — WARFARIN SODIUM 1 MG PO TABS
1.0000 mg | ORAL_TABLET | Freq: Once | ORAL | Status: AC
  Administered 2012-11-05: 1 mg via ORAL
  Filled 2012-11-05: qty 1

## 2012-11-05 MED ORDER — FUROSEMIDE 40 MG PO TABS
40.0000 mg | ORAL_TABLET | Freq: Two times a day (BID) | ORAL | Status: DC
  Administered 2012-11-05 – 2012-11-06 (×2): 40 mg via ORAL
  Filled 2012-11-05 (×4): qty 1

## 2012-11-05 MED ORDER — WARFARIN - PHARMACIST DOSING INPATIENT: Freq: Every day | Status: DC

## 2012-11-05 NOTE — Progress Notes (Signed)
The [facility]  Subjective: Breathing has improved since admission, but not much different since yesterday. Not requiring supplemental O2.  Objective: Vital signs in last 24 hours: Temp:  [97.8 F (36.6 C)-99.2 F (37.3 C)] 98.2 F (36.8 C) (03/11 0521) Pulse Rate:  [82-96] 82 (03/11 0521) Resp:  [18-20] 18 (03/11 0521) BP: (95-117)/(58-75) 116/62 mmHg (03/11 0828) SpO2:  [95 %-98 %] 98 % (03/11 0521) Weight:  [180 lb 1.9 oz (81.7 kg)] 180 lb 1.9 oz (81.7 kg) (03/11 0500) Last BM Date: 11/04/12  Intake/Output from previous day: 03/10 0701 - 03/11 0700 In: 480 [P.O.:480] Out: 500 [Urine:500] Intake/Output this shift:    Medications Current Facility-Administered Medications  Medication Dose Route Frequency Provider Last Rate Last Dose  . acetaminophen (TYLENOL) tablet 500-1,000 mg  500-1,000 mg Oral Q6H PRN Brittainy Simmons, PA-C   1,000 mg at 11/04/12 2249  . allopurinol (ZYLOPRIM) tablet 300 mg  300 mg Oral Daily Rhetta Mura, MD   300 mg at 11/04/12 1203  . atorvastatin (LIPITOR) tablet 80 mg  80 mg Oral q1800 Rhetta Mura, MD   80 mg at 11/04/12 1823  . carvedilol (COREG) tablet 6.25 mg  6.25 mg Oral BID WC Rhetta Mura, MD   6.25 mg at 11/05/12 0828  . cilostazol (PLETAL) tablet 50 mg  50 mg Oral BID Rhetta Mura, MD   50 mg at 11/04/12 2249  . furosemide (LASIX) injection 40 mg  40 mg Intravenous BID Rhetta Mura, MD   40 mg at 11/05/12 0828  . HYDROcodone-acetaminophen (NORCO/VICODIN) 5-325 MG per tablet 1-2 tablet  1-2 tablet Oral Q6H PRN Nada Boozer, NP   2 tablet at 11/04/12 1840  . irbesartan (AVAPRO) tablet 150 mg  150 mg Oral Daily Rhetta Mura, MD   150 mg at 11/04/12 1203  . LORazepam (ATIVAN) tablet 0.5 mg  0.5 mg Oral QHS PRN Rhetta Mura, MD      . multivitamin with minerals tablet 1 tablet  1 tablet Oral Daily Rhetta Mura, MD   1 tablet at 11/04/12 1203  . potassium chloride  SA (K-DUR,KLOR-CON) CR tablet 20 mEq  20 mEq Oral BID Marykay Lex, MD   20 mEq at 11/04/12 2250  . sodium chloride 0.9 % injection 3 mL  3 mL Intravenous Q12H Rhetta Mura, MD   3 mL at 11/04/12 2252  . terazosin (HYTRIN) capsule 5 mg  5 mg Oral QHS Rhetta Mura, MD   5 mg at 11/04/12 2249  . warfarin (COUMADIN) tablet 1 mg  1 mg Oral ONCE-1800 Crystal [place], [facility]      . Warfarin - Pharmacist Dosing Inpatient   Does not apply q1800 Crystal Salomon Fick, [facility]        PE: General appearance: alert, cooperative and no distress Lungs: clear to auscultation bilaterally Heart: irregularly irregular rhythm Extremities: no LEE Pulses: 2+ and symmetric Skin: warm and dry Neurologic: Grossly normal  Lab Results:   Recent Labs  11/03/12 0510  WBC 8.5  HGB 12.4*  HCT 36.2*  PLT 137*   BMET  Recent Labs  11/03/12 0510 11/04/12 1208  NA 142 140  K 3.0* 3.7  CL 102 101  CO2 25 27  GLUCOSE 126* 142*  BUN 33* 31*  CREATININE 1.68* 1.74*  CALCIUM 9.1 9.0   PT/INR  Recent Labs  11/03/12 0510 11/04/12 0730 11/05/12 0610  LABPROT 22.9* 25.5* 28.8*  INR 2.13* 2.46* 2.90*   Assessment/Plan  Principal Problem:   Acute  on chronic combined systolic and diastolic CHF, NYHA class 3 Active Problems:   HYPERLIPIDEMIA   HYPERTENSIVE CARDIOVASCULAR DISEASE    CABG 1999, Cath Fen 2013 with patent grafts and distal disease -refused life vest and ICD   Atrial fibrillation, chronic- permanent   Chronic anticoagulation, secondary to Chronic a. fib.   Ischemic cardiomyopathy, EF 20-25% by 2D Feb 2012   Ventricular tachycardia, non-sustained; secondary to Ischemic CM   Chronic renal disease, stage III   COPD (chronic obstructive pulmonary disease)   AAA (abdominal aortic aneurysm) 5.6cm by CT 7/12  Plan:  Breathing has improved since admission. Currently on IV Lasix 40 mg BID.  Net diuresis over last 24 hours in 0.02 L. Unsure if strict I/Os were  recorded yesterday. Cr has increased to 1.74 (1.68 yesterday). Lungs are clear on exam. Will recheck BNP and will switch to PO Lasix today. He is in A-fib on tele. HR in the 90's. Keep on warfarin for [facility]. INR is therapeutic at 2.90. PT eval yesterday. Therapist recommended nursing facility for rehab post discharge. Case manager is assisting with dispo. Plan for d/c once NF arrangements are made. MD to follow.    LOS: 3 days    Brittainy M. Delmer Islam 11/05/2012 10:48 AM

## 2012-11-05 NOTE — Clinical Social Work Psychosocial (Signed)
Clinical Social Work Department BRIEF PSYCHOSOCIAL ASSESSMENT 11/05/2012  Patient:  [name] [name], [name] [name]     Account Number:  [id number]     Admit date:  11/02/2012  Clinical Social Worker:  Thomasene Mohair  Date/Time:  11/05/2012 01:00 PM  Referred by:  Physician  Date Referred:  11/05/2012 Referred for  SNF Placement   Other Referral:   Interview type:  Patient Other interview type:   Family at bedside    PSYCHOSOCIAL DATA Living Status:  ALONE Admitted from facility:   Level of care:   Primary support name:  Christiana Pellant [phone] home Primary support relationship to patient:  CHILD, ADULT Degree of support available:   4 adult children, 1 girlfriend. all very supportive    CURRENT CONCERNS Current Concerns  Post-Acute Placement   Other Concerns:    SOCIAL WORK ASSESSMENT / PLAN CSW was referred to Pt to assist with dc planning. Pt lives alone and has very supportive family and a girlfriend. Pt was independent of ADL's prior to event and now needs short-term SNF for rehab. Pt's wife of 62 years died in 28-Jan-2008, they had 4 children who are involved in Pt's life. Pt has a girlfriend that he enjoys going out to eat with and they have a good time per Pt.  Pt is a retired IT trainer.  Pt is agreeable to short-term SNF and is familiar with options in his area. CSW sent Pt's information out and have confirmed a SNF bed at [facility]. Pt will likely be ready for DC on Wednesday, family and Pt aware.   Assessment/plan status:  Psychosocial Support/Ongoing Assessment of Needs Other assessment/ plan:   Information/referral to community resources:   SNF list    PATIENT'S/FAMILY'S RESPONSE TO PLAN OF CARE: Pt and family are agreeable to [facility].   Frederico Hamman, LCSW [phone]

## 2012-11-05 NOTE — Progress Notes (Signed)
Pt. Seen and examined. Agree with the NP/PA-C note as written.  Agree with change to po lasix today. Appreciate case managers working toward SNF.  Chrystie Nose, MD, [facility] Attending Cardiologist [facility]

## 2012-11-05 NOTE — Progress Notes (Signed)
OT Cancellation Note  Patient Details Name: [name] [name] MRN: [id number] DOB: 01/03/1926   Cancelled Treatment:    Reason Eval/Treat Not Completed: Other (comment) Pt to D/C to SNF. Will defer OT to SNF. If eval needed, please reorder. Signing off.  [facility], OTR/L  [phone] 11/05/2012 11/05/2012, 8:57 AM

## 2012-11-05 NOTE — Progress Notes (Signed)
ANTICOAGULATION CONSULT NOTE - Follow Up Consult  Pharmacy Consult for Coumadin  Indication: atrial fibrillation  Allergies  Allergen Reactions  . Ace Inhibitors     Cough  . Cimetidine      unknown reaction, pt states per Dr. Jarold Motto  . Ciprofloxacin Other (See Comments)    unknown  . Nsaids     Dr does not want patient to take due to kidneys  . Zantac (Ranitidine Hcl)     Hepatitis per Dr. Jarold Motto    Patient Measurements: Height: 5\' 10"  (177.8 cm) Weight: 180 lb 1.9 oz (81.7 kg) IBW/kg (Calculated) : 73  Vital Signs: Temp: 98.2 F (36.8 C) (03/11 0521) Temp src: Oral (03/11 0521) BP: 95/59 mmHg (03/11 0521) Pulse Rate: 82 (03/11 0521)  Labs:  Recent Labs  11/02/12 1012 11/03/12 0500 11/03/12 0510 11/04/12 0730 11/04/12 1208 11/05/12 0610  HGB 13.6  --  12.4*  --   --   --   HCT 39.9  --  36.2*  --   --   --   PLT 141*  --  137*  --   --   --   LABPROT 21.9*  --  22.9* 25.5*  --  28.8*  INR 2.00*  --  2.13* 2.46*  --  2.90*  CREATININE 1.71*  --  1.68*  --  1.74*  --   TROPONINI  --  <0.30  --   --   --   --     Estimated Creatinine Clearance: 32.6 ml/min (by C-G formula based on Cr of 1.74).   Medications:  . allopurinol  300 mg Oral Daily  . atorvastatin  80 mg Oral q1800  . carvedilol  6.25 mg Oral BID WC  . cilostazol  50 mg Oral BID  . furosemide  40 mg Intravenous BID  . irbesartan  150 mg Oral Daily  . multivitamin with minerals  1 tablet Oral Daily  . potassium chloride  20 mEq Oral BID  . sodium chloride  3 mL Intravenous Q12H  . terazosin  5 mg Oral QHS  . warfarin  2.5 mg Oral q1800     Assessment: 79 yo male on chronic Coumadin for atrial fibrillation admitted for acute on chronic exacerbation of CHF. Coumadin PTA was 2.5mg  daily. INR 2.0 on admission and patient was continued on home dose.   INR at goal, but trending up (2.90 << 2.46 << 2.13 << 2.00). SCr 1.74 (increase) and CrCl 32.30ml/min. H/H 12.4/36.2 and plts 137 from CBC  on 3/9.   Goal of Therapy:  INR 2-3 Monitor platelets by anticoagulation protocol: Yes   Plan:  Coumadin 1mg  x1 tonight  Daily PT/INR   Micheline Chapman PharmD Candidate 11/05/2012,8:23 AM

## 2012-11-06 ENCOUNTER — Inpatient Hospital Stay
Admission: RE | Admit: 2012-11-06 | Discharge: 2012-11-27 | Disposition: A | Discharge status: Home / Self Care | Payer: Medicare Other | Source: Ambulatory Visit | Attending: Internal Medicine | Admitting: Internal Medicine

## 2012-11-06 LAB — PROTIME-INR
INR: 3.2 — ABNORMAL HIGH (ref 0.00–1.49)
Prothrombin Time: 31 seconds — ABNORMAL HIGH (ref 11.6–15.2)

## 2012-11-06 LAB — BASIC METABOLIC PANEL
CO2: 29 mEq/L (ref 19–32)
Chloride: 99 mEq/L (ref 96–112)
GFR calc Af Amer: 31 mL/min — ABNORMAL LOW (ref >90)
Potassium: 4.3 mEq/L (ref 3.5–5.1)

## 2012-11-06 MED ORDER — CARVEDILOL 6.25 MG PO TABS: 6.2500 mg | ORAL_TABLET | Freq: Two times a day (BID) | ORAL | Status: DC

## 2012-11-06 MED ORDER — FUROSEMIDE 40 MG PO TABS: 40.0000 mg | ORAL_TABLET | Freq: Every day | ORAL | Status: DC

## 2012-11-06 MED ORDER — ONDANSETRON HCL 4 MG/2ML IJ SOLN: 4.0000 mg | Freq: Four times a day (QID) | INTRAMUSCULAR | Status: DC | PRN

## 2012-11-06 MED ORDER — ZOLPIDEM TARTRATE 5 MG PO TABS: 5.0000 mg | ORAL_TABLET | Freq: Every evening | ORAL | Status: DC | PRN

## 2012-11-06 NOTE — Progress Notes (Signed)
[facility]  Subjective: No complaints. Breathing comfortably.  Objective: Vital signs in last 24 hours: Temp:  [97.6 F (36.4 C)-98.2 F (36.8 C)] 97.6 F (36.4 C) (03/12 0401) Pulse Rate:  [78-97] 78 (03/12 0805) Resp:  [18-20] 20 (03/12 0401) BP: (98-110)/(55-73) 108/70 mmHg (03/12 0805) SpO2:  [94 %-97 %] 94 % (03/12 0401) Weight:  [180 lb 12.4 oz (82 kg)] 180 lb 12.4 oz (82 kg) (03/12 0401) Last BM Date: 11/04/12  Intake/Output from previous day: 03/11 0701 - 03/12 0700 In: 960 [P.O.:960] Out: 550 [Urine:550] Intake/Output this shift:    Medications Current Facility-Administered Medications  Medication Dose Route Frequency Jameika Kinn Last Rate Last Dose  . acetaminophen (TYLENOL) tablet 500-1,000 mg  500-1,000 mg Oral Q6H PRN Brittainy Simmons, PA-C   500 mg at 11/05/12 1739  . allopurinol (ZYLOPRIM) tablet 300 mg  300 mg Oral Daily Rhetta Mura, MD   300 mg at 11/05/12 1120  . atorvastatin (LIPITOR) tablet 80 mg  80 mg Oral q1800 Rhetta Mura, MD   80 mg at 11/05/12 1739  . carvedilol (COREG) tablet 6.25 mg  6.25 mg Oral BID WC Rhetta Mura, MD   6.25 mg at 11/06/12 0806  . cilostazol (PLETAL) tablet 50 mg  50 mg Oral BID Rhetta Mura, MD   50 mg at 11/05/12 2140  . furosemide (LASIX) tablet 40 mg  40 mg Oral BID Chrystie Nose, MD   40 mg at 11/06/12 0806  . HYDROcodone-acetaminophen (NORCO/VICODIN) 5-325 MG per tablet 1-2 tablet  1-2 tablet Oral Q6H PRN Nada Boozer, NP   2 tablet at 11/06/12 0508  . irbesartan (AVAPRO) tablet 150 mg  150 mg Oral Daily Rhetta Mura, MD   150 mg at 11/05/12 1120  . LORazepam (ATIVAN) tablet 0.5 mg  0.5 mg Oral QHS PRN Rhetta Mura, MD      . multivitamin with minerals tablet 1 tablet  1 tablet Oral Daily Rhetta Mura, MD   1 tablet at 11/05/12 1120  . potassium chloride SA (K-DUR,KLOR-CON) CR tablet 20 mEq  20 mEq Oral BID Marykay Lex, MD   20 mEq at  11/05/12 2140  . sodium chloride 0.9 % injection 3 mL  3 mL Intravenous Q12H Rhetta Mura, MD   3 mL at 11/05/12 2141  . terazosin (HYTRIN) capsule 5 mg  5 mg Oral QHS Rhetta Mura, MD   5 mg at 11/05/12 2140  . Warfarin - Pharmacist Dosing Inpatient   Does not apply q1800 Crystal Salomon Fick, [facility]        PE: General appearance: alert, cooperative and no distress Lungs: clear to auscultation bilaterally Heart: irregularly irregular rhythm Extremities: no LEE Pulses: 2+ and symmetric Skin: warm and dry Neurologic: Grossly normal  Lab Results:  No results found for this basename: WBC, HGB, HCT, PLT,  in the last 72 hours BMET  Recent Labs  11/04/12 1208  NA 140  K 3.7  CL 101  CO2 27  GLUCOSE 142*  BUN 31*  CREATININE 1.74*  CALCIUM 9.0   PT/INR  Recent Labs  11/04/12 0730 11/05/12 0610 11/06/12 0625  LABPROT 25.5* 28.8* 31.0*  INR 2.46* 2.90* 3.20*    Assessment/Plan  Principal Problem:   Acute on chronic combined systolic and diastolic CHF, NYHA class 3 Active Problems:   HYPERLIPIDEMIA   HYPERTENSIVE CARDIOVASCULAR DISEASE    CABG 1999, Cath Fen 2013 with patent grafts and distal disease -refused life vest and ICD   Atrial fibrillation,  chronic- permanent   Chronic anticoagulation, secondary to Chronic a. fib.   Ischemic cardiomyopathy, EF 20-25% by 2D Feb 2012   Ventricular tachycardia, non-sustained; secondary to Ischemic CM   Chronic renal disease, stage III   COPD (chronic obstructive pulmonary disease)   AAA (abdominal aortic aneurysm) 5.6cm by CT 7/12  Plan: BNP is improved to 6846 (9157 on admission). Pt also has stage III CKD. He was switched from IV to PO Lasix yesterday. Breathing is improved. Chronic A-fib is rate controlled in the 80s. Labs are pending. Plan for D/C to SNF to [facility] in [place]. Will arrange OP follow-up. MD to see before d/c.     LOS: 4 days    Brittainy M. Sharol Harness, PA-C 11/06/2012 10:02  AM

## 2012-11-06 NOTE — Progress Notes (Addendum)
I have seen and evaluated the patient this AM along with Boyce Medici, PA. I agree with her findings, examination as well as impression recommendations.  His breathing is much better & edema all but gone.  UOP is down with conversion to PO Lasix & Cr is up just a bit to 2.15.  Otherwise, he is doing quite well.  At this point, I think clinically,he is ready for d/c -- will back off on Lasix for today & tomorrow.  Will need repeat BMP & INR check on Friday.  Pending those results, may be able to return to BID lasix.  With his severe ICM & relatively recent admission, he is at high risk for re-admission.  He clearly will need d/c to SNF -- & [facility] would be great.  Can have labs checked there.   He will need TCM f/u early next week -- He will be @ [facility] in [place], but has always been seein in our [place] office.   (3/17 @ 1500 with Dr. [name] @ [facility] Office is available if it would be more convenient for him to be seen there; he is pencil'ed in for that date & time, but would also plan f/u @ [facility] with either Dr. [name] or an AP as a TCM-5)   Marykay Lex, M.D., M.S. [facility] [address] [place], [place]  [address]  [phone] Pager # [phone] 11/07/2012 11:58 AM

## 2012-11-06 NOTE — Progress Notes (Signed)
ANTICOAGULATION CONSULT NOTE - Follow Up Consult  Pharmacy Consult for Coumadin  Indication: atrial fibrillation  Allergies  Allergen Reactions  . Ace Inhibitors     Cough  . Cimetidine      unknown reaction, pt states per Dr. Jarold Motto  . Ciprofloxacin Other (See Comments)    unknown  . Nsaids     Dr does not want patient to take due to kidneys  . Zantac (Ranitidine Hcl)     Hepatitis per Dr. Jarold Motto    Patient Measurements: Height: 5\' 10"  (177.8 cm) Weight: 180 lb 12.4 oz (82 kg) IBW/kg (Calculated) : 73  Vital Signs: Temp: 97.6 F (36.4 C) (03/12 0401) Temp src: Oral (03/12 0401) BP: 108/70 mmHg (03/12 0805) Pulse Rate: 78 (03/12 0805)  Labs:  Recent Labs  11/04/12 0730 11/04/12 1208 11/05/12 0610 11/06/12 0625  LABPROT 25.5*  --  28.8* 31.0*  INR 2.46*  --  2.90* 3.20*  CREATININE  --  1.74*  --   --     Estimated Creatinine Clearance: 32.6 ml/min (by C-G formula based on Cr of 1.74).   Medications:  . allopurinol  300 mg Oral Daily  . atorvastatin  80 mg Oral q1800  . carvedilol  6.25 mg Oral BID WC  . cilostazol  50 mg Oral BID  . furosemide  40 mg Oral BID  . irbesartan  150 mg Oral Daily  . multivitamin with minerals  1 tablet Oral Daily  . potassium chloride  20 mEq Oral BID  . sodium chloride  3 mL Intravenous Q12H  . terazosin  5 mg Oral QHS  . [COMPLETED] warfarin  1 mg Oral ONCE-1800  . Warfarin - Pharmacist Dosing Inpatient   Does not apply q1800  . [DISCONTINUED] furosemide  40 mg Intravenous BID     Assessment: 79 year old male on Coumadin PTA for Afib. Admitted with SOB and WOB=CHF exacerbation. Coumadin PTA was 2.5mg  daily with INR= 2.   Anticoagulation: Coumadin PTA for afib. INR 3.2, trending up.  Infectious Disease: WBC WNL and afebrile. No abx.   Cardiovascular: CHF exacerbation, Hx HTN, HLD, CAD, HX MI, CAF, Ascending aortic aneurysm, CABG, ICM (EF 20-25%). Refused ICD & Life vest. BP 108/70, HR 78 Meds: Lipitor, Coreg,  Pletal, Lasix, Avapro, K+, Hytrin  Endocrinology: CBGs okay on BMET, allopurinol for gout (acute attack, left MD sticky note to consider colchicine)  Gastrointestinal: h/o GERD. No meds  Neurology: Lorazepam PRN. Pain with acute gouty flare.   Nephrology: CRI, SCr 1.74 (incr) and CrCl 32.33ml/min. I/O +410  Pulmonary: COPD,Pulm nodule, persistent cough. 94% on RA  Hematology / Oncology: H/H 12.4/36.2 (CBC on 3/9) and plts 137.   Goal of Therapy:  INR 2-3 Monitor platelets by anticoagulation protocol: Yes   Plan:  Hold Coumadin x 1 tonight.  Crystal S. Merilynn Finland, PharmD, BCPS Clinical Staff Pharmacist Pager [phone]  11/06/2012,9:51 AM

## 2012-11-06 NOTE — Plan of Care (Signed)
Problem: Discharge Progression Outcomes Goal: Able to perform self care activities Outcome: Not Met (add Reason) Dc snf for rehab

## 2012-11-06 NOTE — Discharge Summary (Addendum)
Physician Discharge Summary  Patient ID: [name] [name] MRN: [id number] DOB/AGE: Jan 04, 1926 79 y.o.  Admit date: 11/02/2012 Discharge date: 11/07/2012  Admission Diagnoses: Acute on Chronic Combined Systolic and Diastolic CHF  Discharge Diagnoses:  Principal Problem:   Acute on chronic combined systolic and diastolic CHF, NYHA class 3 Active Problems:    CABG 1999, Cath Fen 2013 with patent grafts and distal disease -refused life vest and ICD   Ischemic cardiomyopathy, EF 20-25% by 2D Feb 2012   Ventricular tachycardia, non-sustained; secondary to Ischemic CM   Atrial fibrillation, chronic- permanent   Chronic renal disease, stage III   Acute and chronic respiratory failure with hypoxia - due to Acute on Chronic combined CHF; resolved in ER after diuresis   HYPERLIPIDEMIA   Chronic anticoagulation, secondary to Chronic a. fib.   HYPERTENSIVE CARDIOVASCULAR DISEASE   COPD (chronic obstructive pulmonary disease)   AAA (abdominal aortic aneurysm) 5.6cm by CT 7/12   Discharged Condition: stable  Hospital Course: The patient is a 79 y/o male, who has been followed by Dr. [name]. He has complex cardiac history, including CAD, ICM, chronic a-fib (rate controlled and on warfarin for [facility]), AAA with 2+ aortic insufficiency. He also has moderate pulmonary HTN and stage III CKD. He underwent CABG in 1999. His last cath was in Feb. 2013, which revealed patent grafts and distal disease. His EF at the time was 20-25%.  However, he refused a LifeVest and ICD.   He presented to the ER on 11/02/12 with complaints of progressively worsening SOB and decreased exercise tolerance. He denied CP, orthopnea/PND and lower extremity swelling. The work-up in the ER revealed a BNP of 9,157. A CXR showed cardiomegaly, cephalization of the pulmonary vasculature and superimposed interstitial pulmonary edema. In the ER his SpO2 dropped into the high 70's/upper 80's. He had no prior oxygen requirements at baseline. His SpO2  normalized with supplemental O2. He was admitted for heart failure. He ruled out for an MI. His a-fib was rate-controlled and INR was therapeutic. He was treated with IV Lasix and had good diuresis. His breathing improved. He remained on IV Lasix for two days and was switched to PO Lasix.  The patient did have a slight increase in his serum creatine to 2.15. His Lasix was decreased from twice a day to one time a day. On hospital day 4, he was seen and examined by Dr. Herbie Baltimore, who felt he was stable for discharge. He had a PT evaluation and it was recommended that he undergo rehab at a SNF. Placement has been arranged at the [facility] in [place].  He is ordered to get a follow-up BMP on 3/14. Pending those results, he may be able to return to BID Lasix. He is also ordered to have his INR checked on 3/14.  He will follow-up with Dr. [name] in 5-7 days.   Consults: None  Significant Diagnostic Studies: None  Treatments: See Hospital Course  Discharge Exam: Blood pressure 113/69, pulse 96, temperature 98.1 F (36.7 C), temperature source Oral, resp. rate 18, height 5\' 10"  (1.778 m), weight 82 kg (180 lb 12.4 oz), SpO2 97.00%.  Disposition: Skilled Nursing Facility [facility], [place], [place])      Discharge Orders   Future Appointments Provider Department Dept Phone   11/18/2012 3:40 PM [facility] Pharmacist [facility] [phone]   01/01/2013 2:30 PM [facility] [phone]   01/01/2013 3:00 PM [facility] [phone]  01/01/2013 3:40 PM Evern Bio, NP [facility] [phone]   04/17/2013 12:00 PM Delight Ovens, MD [facility] [phone]   Future Orders Complete By Expires     Diet - low sodium heart healthy  As directed     Discharge instructions  As directed     Comments:      Get Lab work  done at [facility] and INR) on 3/14    Increase activity slowly  As directed         Medication List    TAKE these medications       acetaminophen 500 MG tablet  Commonly known as:  TYLENOL  Take 1,000 mg by mouth every 6 (six) hours as needed for pain.     allopurinol 300 MG tablet  Commonly known as:  ZYLOPRIM  Take 300 mg by mouth daily.     atorvastatin 80 MG tablet  Commonly known as:  LIPITOR  Take 80 mg by mouth daily.     carvedilol 6.25 MG tablet  Commonly known as:  COREG  Take 1 tablet (6.25 mg total) by mouth 2 (two) times daily with a meal.     cilostazol 50 MG tablet  Commonly known as:  PLETAL  Take 50 mg by mouth 2 (two) times daily.     furosemide 40 MG tablet  Commonly known as:  LASIX  Take 1 tablet (40 mg total) by mouth daily.     LORazepam 0.5 MG tablet  Commonly known as:  ATIVAN  Take 0.5 mg by mouth at bedtime as needed for anxiety (to help sleep). For anxiety     multivitamin with minerals Tabs  Take 1 tablet by mouth daily.     olmesartan 20 MG tablet  Commonly known as:  BENICAR  Take 10 mg by mouth daily.     terazosin 5 MG capsule  Commonly known as:  HYTRIN  Take 5 mg by mouth at bedtime.     warfarin 5 MG tablet  Commonly known as:  COUMADIN  Take 2.5 mg by mouth daily.       Follow-up Information   Follow up with Governor Rooks, MD. (Our office will call [facility] to arrange an appointment for you to see Dr. [name], either at our [facility] office)    Contact information:   [address] [place] [address] [phone]  [place] [facility]  [phone]     TIME SPENT ON DISCHARGE: 45 MINUTES  Signed: Allayne Butcher, PA-C 11/07/2012, 3:54 PM   I have seen and evaluated the patient this AM along with Boyce Medici, PA. I agree with her findings, examination as well as impression recommendations.   He did have Acute Hypoxic Respiratory Failure upon initial ER  evaluation, that was resolved after initial treatment with diuretics.   His breathing is much better & edema all but gone. UOP is down with conversion to PO Lasix & Cr is up just a bit to 2.15. Otherwise, he is doing quite well.  At this point, I think clinically,he is ready for d/c -- will back off on Lasix for today & tomorrow. Will need repeat BMP & INR check on Friday. Pending those results, may be able to return to BID lasix.  With his severe ICM & relatively recent admission, he is at high risk for re-admission. He clearly will need d/c to SNF -- & [facility] would be  great. Can have labs checked there.  He will need TCM f/u early next week -- He will be @ [facility] in [place], but has always been seein in our [place] office.  (3/17 @ 1500 with Dr. [name] @ [facility] Office is available if it would be more convenient for him to be seen there; he is pencil'ed in for that date & time, but would also plan f/u @ [facility] with either Dr. [name] or an AP as a TCM-5)   Marykay Lex, M.D., M.S. [facility] [address] [place], [place]  [address]  [phone] Pager # [phone] 11/07/2012 3:54 PM

## 2012-11-11 NOTE — Clinical Social Work Placement (Signed)
Late entry 11/12/12 for 11/06/12  Clinical Social Work Department CLINICAL SOCIAL WORK PLACEMENT NOTE 11/12/2012  Patient:  [name] [name], [name] [name]  Account Number:  [id number] Admit date:  11/02/2012  Clinical Social Worker:  Frederico Hamman, LCSW  Date/time:  11/05/2012 12:00 M  Clinical Social Work is seeking post-discharge placement for this patient at the following level of care:   SKILLED NURSING   (*CSW will update this form in Epic as items are completed)   11/05/2012  Patient/family provided with [facility] Department of Clinical Social Work's list of facilities offering this level of care within the geographic area requested by the patient (or if unable, by the patient's family).  11/05/2012  Patient/family informed of their freedom to choose among providers that offer the needed level of care, that participate in Medicare, Medicaid or managed care program needed by the patient, have an available bed and are willing to accept the patient.  11/05/2012  Patient/family informed of [facility]' ownership interest in [facility], as well as of the fact that they are under no obligation to receive care at this facility.  PASARR submitted to EDS on 11/06/2012 PASARR number received from EDS on 11/06/2012  FL2 transmitted to all facilities in geographic area requested by pt/family on  11/05/2012 FL2 transmitted to all facilities within larger geographic area on   Patient informed that his/her managed care company has contracts with or will negotiate with  certain facilities, including the following:     Patient/family informed of bed offers received:  11/06/2012 Patient chooses bed at [facility] Physician recommends and patient chooses bed at    Patient to be transferred to [facility] on  11/06/2012 Patient to be transferred to facility by [facility]  The following physician request were entered in Epic:   Additional Comments: Pt's family will meet Pt at SNF.   Non-emergency ambulance arranged for transport.  Insurance auth obtained prior to dc.  No further CSW needs.  Frederico Hamman, LCSW [phone]

## 2012-11-19 ENCOUNTER — Non-Acute Institutional Stay (SKILLED_NURSING_FACILITY): Payer: Medicare Other | Admitting: Internal Medicine

## 2012-11-19 DIAGNOSIS — N183 Chronic kidney disease, stage 3 unspecified: Secondary | ICD-10-CM

## 2012-11-19 DIAGNOSIS — I509 Heart failure, unspecified: Secondary | ICD-10-CM

## 2012-11-19 DIAGNOSIS — M109 Gout, unspecified: Secondary | ICD-10-CM

## 2012-11-19 DIAGNOSIS — I4891 Unspecified atrial fibrillation: Secondary | ICD-10-CM

## 2012-11-20 ENCOUNTER — Non-Acute Institutional Stay (SKILLED_NURSING_FACILITY): Payer: Medicare Other | Admitting: Internal Medicine

## 2012-11-20 DIAGNOSIS — M109 Gout, unspecified: Secondary | ICD-10-CM

## 2012-11-20 DIAGNOSIS — M10371 Gout due to renal impairment, right ankle and foot: Secondary | ICD-10-CM

## 2012-11-20 DIAGNOSIS — I509 Heart failure, unspecified: Secondary | ICD-10-CM

## 2012-11-20 DIAGNOSIS — J449 Chronic obstructive pulmonary disease, unspecified: Secondary | ICD-10-CM

## 2012-11-20 NOTE — Progress Notes (Signed)
Patient ID: [name] [name], male   DOB: [birth year], 79 y.o.   MRN: [id number] Chief complaint; review of medical insurance issues  History; as was a patient who was admitted to the hospital from 11/02/2012 through 11/06/2012.  He had biventricular heart failure and is listed as being New York Heart Association class III.  He came here for rehabilitation.  Almost immediately he developed acute gout in his metatarsal heads on I believe his right ankle and right knee.  Although his uric acid level was not that high this responded to prednisone at 20 mg a day and colchicine 0.6 twice a day.  Factor response was fairly dramatic.  Fortunately in the time frame where he had acute gout there were several low timeframes where he or one reason or another did not go to physical therapy and therefore his insurance has cut him off from services.  An appeal has apparently been denied.  He he did complain to me the other day of what I felt might be some form of chest discomfort.  I did an EKG on him which shows incomplete left bundle-branch block.  There are ST-T changes laterally.  Underlying atrial fibrillation is chronic with a controlled ventricular response.  Review of systems; Respiratory; he has COPD but is not complaining of cough wheezing or shortness of breath Cardiac no exertional chest pain however he is really not ambulating since he is coming here although he apparently is able to walk to the bathroom Abdomen; complaining of diarrhea one to 2 loose stools per day which she attributes to the colchicine  Physical exam; Respiratory: Air entry is clear no wheezing her breathing is normal Cardiac; sounds are regular no murmurs no signs of congestive heart failure no S3 Abdomen; no liver no spleen no tenderness Neurologic; his strength in his lower extremities is within normal limits.  He is hyperreflexic at the knees and ankle jerks.  I attempted to stand him his gait is wide-based and very unsteady.   Apparently some of this was chronic and uses a walker at home, the less I am concerned about this and am also concerned he has had no therapy in the facility.   Impression/plan #1 ischemic cardiomyopathy; fortunately this is stable #2 atrial fibrillation rate is controlled however he is not anticoagulated and a repeat INR is tomorrow #3 gait ataxia I wonder whether he has an underlying neuropathy, nevertheless recumbency over the last week or 2 has not made this any better and his insurance has cut him off from further therapy.  I am uncomfortable about him going home like this, lives alone however his daughter's said she will go to stay with him.  See if I can appeal to the insurance company medical director to allow perhaps 2 weeks of physical and occupational there #4 acute gout this is a lot better however he has tenderness in the left first metatarsophalangeal joint nevertheless this is nonlimiting I would like to try and make continue him on the colchicine.  Certainly not a candidate for NSAIDs or ongoing steroids.  Living with 1-2 loose bowel movements today might seem a reasonable alternative

## 2012-11-22 NOTE — Progress Notes (Shared)
Patient ID: [name] [name], male   DOB: 19-Mar-1926, 79 y.o.   MRN: [id number]          HISTORY & PHYSICAL  DATE:  03.19.2014  FACILITY: [facility]   LEVEL OF CARE:  SNF   CHIEF COMPLAINT:   This is an admission, status post stay at [facility] 03.08.2014 through 03.12.2014.     HISTORY OF PRESENT ILLNESS:  This is an 79 year-old man who lives in [place] on his own, independent, followed by Dr. [name] for many years.  He has a history of coronary artery disease, chronic atrial fib, on coumadin, moderate pulmonary hypertension and stage III chronic kidney disease.  He had a CABG in 1999.  Last cath in February 2013 which revealed patent grafts.  His EF at the time was 20-25%.  He has  apparently previously refused an ICD.    He was admitted to hospital with congestive heart failure.   His BNP was 9,157.  Chest x-ray showed pulmonary edema.  He was diuresed in the hospital with IV lasix and then he was switched to oral lasix, first at twice a day and then one time a day.    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  Hyperlipidemia.    Hypertensive cardiovascular disease.  CABG in 1999 and cath in February 2013 with patent grafts.   Atrial fibrillation.  Chronic anticoagulation, on coumadin.    Ischemic cardiomyopathy with an EF of 20-25%.    Ventricular tachycardia, nonsustained.    Chronic renal failure stage III.    COPD.    AAA, 5.6 cm by CT in July 2012.    SOCIAL HISTORY: HOUSING:  The patient lives in [place].  FUNCTIONAL STATUS:  He uses a walker at home but, nevertheless, is independent.  He has surrounding children.  They are not involved with his ADL and IADL care.   CODE STATUS:  He is a No Code.  Has a DNR in place.    FAMILY HISTORY:  None related by the patient.    CURRENT MEDICATIONS:  Medication list is reviewed.    Allopurinol 300 a day.   Atorvastatin 80 q.d.    Carvedilol 6.25 b.i.d.   Cilostozol 50 b.i.d.   Lasix 40 daily.  Lorazepam 0.5 q.d.   Benicar 10 q.d.  Hytrin 5 q.d.   Warfarin 2.5 daily.    REVIEW OF SYSTEMS:   CHEST/RESPIRATORY:  He is not complaining of shortness of breath.   CARDIAC:   No chest pain.   GI:  No nausea or vomiting, or diarrhea.   GU:  No dysuria.   MUSCULOSKELETAL:  Complaining of severe pain in his right greater than left foot, also his right elbow and right third finger.   NEUROLOGICAL:   States he has an unsteady gait.  Uses a walker at home fairly religiously.   PSYCHIATRIC:  Family reports confusion today.    PHYSICAL EXAMINATION:   VITAL SIGNS:   O2 SATURATIONS:  His pulse ox is 95% on room air.   PULSE:  85.   RESPIRATIONS:  18.   GENERAL APPEARANCE:  The patient is in no distress.   CHEST/RESPIRATORY:  Clear air entry bilaterally.  No wheezing.  CARDIOVASCULAR:  CARDIAC:   Heart sounds are irregular.  There are no murmurs.  No S3.  His JVP is elevated at 45 degrees.   GASTROINTESTINAL:  ABDOMEN:  Somewhat distended.  He has a  large epigastric scar which he says is from a remote gastric ulcer surgery.   LIVER/SPLEEN/KIDNEYS:  No liver, no spleen.  No shifting dullness.   GENITOURINARY:   BLADDER:  His bladder is not enlarged.   MUSCULOSKELETAL:   EXTREMITIES:  Notable for acute gout in the right first, fourth, fifth metatarsal phalangeals, all of the metatarsal phalangeals on the left.  His right third PIP is intensely swollen and painful and, to a lesser extent, his right elbow.  He says he has battled this for years.  I see no tophi.  I note that he is on allopurinol and he says he received colchicine in the hospital, although I do not see any notes on this.    NEUROLOGICAL:    BALANCE/GAIT:  I did not attempt to walk him due to the pain in his feet from the acute arthritis.    LABORATORY DATA:  His lab work is reviewed.  His INR is 2.06 today, on 2 mg of coumadin.  This will need to be  increased.    ASSESSMENT/PLAN:  Combined congestive heart failure (            ).  His JVP is elevated.  However, his weight has actually gone down by three pounds since his arrival here on 03.15.2014 from 181 to 178.  Therefore, I will not adjust his lasix at this time.    Atrial fibrillation (            ).  Rate is controlled.  I have increased his coumadin back to 2.5 mg a day.   Acute gout in multiple joints (            ).  See HPI above.  He is certainly not a candidate for NSAIDs.  I have put him on prednisone 20 mg a day for seven days and colchicine at 0.6 b.i.d.   Ischemic cardiomyopathy (            ).  No further lasix at this point.  His heart rate is controlled.    Confusion this morning, related by the patient and his family (            ).  This seems to have cleared.  This may have been the lorazepam he received last night.  I will try to change this.    Gastroesophageal reflux (            ).  He does not appear to be symptomatic.     CPT CODE: 84132.

## 2012-11-27 ENCOUNTER — Non-Acute Institutional Stay (SKILLED_NURSING_FACILITY): Payer: Medicare Other | Admitting: Internal Medicine

## 2012-11-27 DIAGNOSIS — M109 Gout, unspecified: Secondary | ICD-10-CM

## 2012-11-27 DIAGNOSIS — M10371 Gout due to renal impairment, right ankle and foot: Secondary | ICD-10-CM

## 2012-11-27 DIAGNOSIS — I509 Heart failure, unspecified: Secondary | ICD-10-CM

## 2012-11-27 DIAGNOSIS — N183 Chronic kidney disease, stage 3 unspecified: Secondary | ICD-10-CM

## 2012-11-27 NOTE — Progress Notes (Signed)
Patient ID: [name] [name], male   DOB: Sep 12, 1925, 79 y.o.   MRN: [id number] Chief complaint; discharge from SNF  History; this is an 79 year old man who has a complex cardiac history including CAD, ISCHEMIC cardiomyopathy with an EF of 20-25%, severe pulmonary hypertension, age 71 chronic kidney disease or his last CABG was in 1999, last half February 2013 revealed patent grafts and distal disease. He has refused an ICD.   The patient came to [place] after admission with congestive heart failure. He was discharged on Lasix 40 mg daily. He came to [place] with an acute polyarthropathy involving and numerous bilateral metatarsal heads, therefore it was almost impossible to walk. He therefore refused physical therapy although the cause and effect here is from less than clear. In any case he came across as refusing therapy, his insurance therefore stopped covering his stay here, and in spite of my talking to their medical director yesterday there is apparently no avenue for appeal. I am therefore seeing him for discharge  In terms of his CHF his admission weight here was 181 pounds it is now down to 170 pounds 2 weeks later therefore I think things have been stable. His basic metabolic panel today showed a sodium of 139, potassium 3.6, BUN of 26 and creatinine of 1.68. Total CO2 at 27. His BN P. was 7507 in spite of this he has no edema and really no evidence of pulmonary edema. I am therefore planning to send him home on Lasix 40 mg a day he will need a followup basic metabolic panel with his primary   Provider. Finally we have had issues with anticoagulation. His after adjustments his INR was 2.33 on March 31 his current Coumadin dose is 4 mg with a repeat today. As regards to the gout he is on allopurinol 300 today with a uric acid level of 5.7 he is also on colchicine 0.6 twice a day.  Current medications; Colchicine is 0.6 twice a day, Pletal 50 twice a day, Coreg 6.25 twice a day, Hytrin 5 mg each bedtime, Coumadin  likely to be 4 mg although this is going to be finalized based on an INR today. Other medications Cozaar 25 mg daily, Lipitor 80 mg daily, allopurinol 300 daily, Lasix 40 daily  Social patient lives in his own home in [place]. He does not have in-home support therefore I strongly suggested that his daughter told him in the short term.   Review of systems  Respiratory-no shortness of breath Cardiac no chest pain Musculus still some pain in the right foot with walking although this is much  Physical exam 70 discharge weight 173.1 temperature 97.4 pulse 88 respirations 20 blood pressure 146/72 O2 sat is 99% therefore I do not believe he requires chronic oxygen. Respiratory clear entry bilaterally no wheezing Cardiac heart sounds are regular, JVD not elevated, no edema Gait- wide-based and unsteady balance is very poor.   Impressions/plan at the patient's insistence I am writing him for discharge. I did talked to the medical director of his insurance yesterday there is apparently no avenue for appeal with. He is not receive physical therapy here due to some combination of refusal according to our staff and painful gout according to the patient and his daughter.  CHF in spite of the elevated BNP I think Lasix at 40 is the correct dose Gait ataxia; reason behind this is unclear to me. Unfortunately he has received no therapy while he is here see above Gout still has some mild  tenderness in the left MTP however this is considerably better than when he arrived. Should have his uric acid level checked in a week or 2 with his primary physician. Although R. uric acid level was in the normal range gout paradoxically causes a reduction in serum uric acid. Further uric acid in the normal range does not preclude acute gout. Nevertheless when he had responded nicely to prednisone and colchicine.  COPD This seems stable, no evidence he requires ongoing O2  I will send him home with PTOT and skilled nursing. He  will need for with his primary physician in a week. Strongly suggest in here with his daughter. He is at high risk for falls and is on Coumadin which concerns. He already has a walker unfortunately I was unable to seem him use this.   Code for this visit 99316 well over 30 minutes

## 2012-11-28 ENCOUNTER — Telehealth: Payer: Self-pay | Admitting: Family Medicine

## 2012-11-28 NOTE — Telephone Encounter (Signed)
APPT MADE FOR TUES WITH [facility]

## 2012-11-29 ENCOUNTER — Ambulatory Visit: Payer: Medicare Other | Admitting: Physician Assistant

## 2012-12-01 ENCOUNTER — Non-Acute Institutional Stay (SKILLED_NURSING_FACILITY): Payer: Medicare Other | Admitting: Internal Medicine

## 2012-12-01 DIAGNOSIS — M109 Gout, unspecified: Secondary | ICD-10-CM

## 2012-12-01 DIAGNOSIS — I509 Heart failure, unspecified: Secondary | ICD-10-CM

## 2012-12-03 ENCOUNTER — Encounter: Payer: Self-pay | Admitting: Family Medicine

## 2012-12-03 ENCOUNTER — Ambulatory Visit (INDEPENDENT_AMBULATORY_CARE_PROVIDER_SITE_OTHER): Payer: Medicare Other | Admitting: Family Medicine

## 2012-12-03 VITALS — BP 118/66 | HR 91 | Temp 96.6°F | Ht 70.0 in | Wt 174.0 lb

## 2012-12-03 DIAGNOSIS — I119 Hypertensive heart disease without heart failure: Secondary | ICD-10-CM

## 2012-12-03 DIAGNOSIS — R5381 Other malaise: Secondary | ICD-10-CM

## 2012-12-03 DIAGNOSIS — R5383 Other fatigue: Secondary | ICD-10-CM

## 2012-12-03 DIAGNOSIS — J449 Chronic obstructive pulmonary disease, unspecified: Secondary | ICD-10-CM

## 2012-12-03 DIAGNOSIS — M109 Gout, unspecified: Secondary | ICD-10-CM

## 2012-12-03 DIAGNOSIS — I5043 Acute on chronic combined systolic (congestive) and diastolic (congestive) heart failure: Secondary | ICD-10-CM

## 2012-12-03 DIAGNOSIS — Z7901 Long term (current) use of anticoagulants: Secondary | ICD-10-CM

## 2012-12-03 DIAGNOSIS — K219 Gastro-esophageal reflux disease without esophagitis: Secondary | ICD-10-CM

## 2012-12-03 DIAGNOSIS — I509 Heart failure, unspecified: Secondary | ICD-10-CM

## 2012-12-03 DIAGNOSIS — J4489 Other specified chronic obstructive pulmonary disease: Secondary | ICD-10-CM

## 2012-12-03 DIAGNOSIS — N183 Chronic kidney disease, stage 3 unspecified: Secondary | ICD-10-CM

## 2012-12-03 DIAGNOSIS — I4891 Unspecified atrial fibrillation: Secondary | ICD-10-CM

## 2012-12-03 DIAGNOSIS — R0602 Shortness of breath: Secondary | ICD-10-CM

## 2012-12-03 LAB — BASIC METABOLIC PANEL WITH GFR
BUN: 30 mg/dL — ABNORMAL HIGH (ref 6–23)
CO2: 27 mEq/L (ref 19–32)
Calcium: 9.1 mg/dL (ref 8.4–10.5)
GFR, Est African American: 42 mL/min — ABNORMAL LOW
Glucose, Bld: 132 mg/dL — ABNORMAL HIGH (ref 70–99)

## 2012-12-03 LAB — POCT CBC
Granulocyte percent: 74.7 %G (ref 37–80)
Lymph, poc: 1.9 (ref 0.6–3.4)
POC Granulocyte: 6.6 (ref 2–6.9)
POC LYMPH PERCENT: 21.1 %L (ref 10–50)
WBC: 8.8 10*3/uL (ref 4.6–10.2)

## 2012-12-03 LAB — HEPATIC FUNCTION PANEL
ALT: 20 U/L (ref 0–53)
Alkaline Phosphatase: 109 U/L (ref 39–117)
Indirect Bilirubin: 0.8 mg/dL (ref 0.0–0.9)
Total Protein: 6.6 g/dL (ref 6.0–8.3)

## 2012-12-03 NOTE — Progress Notes (Signed)
  Subjective:    Patient ID: [name] [name], male    DOB: Feb 09, 1926, 79 y.o.   MRN: [id number]  HPI Recent hospitalization, [facility] X 32 days Home X 6 days See problem list for his multiple problems   Review of Systems  Constitutional: Positive for activity change (since recent hospitalization) and appetite change. Negative for fever.  HENT: Positive for congestion. Negative for ear pain, postnasal drip and sinus pressure.   Respiratory: Positive for choking, shortness of breath and wheezing.   Gastrointestinal: Positive for diarrhea (due to Colcrys).  Genitourinary: Negative.   Musculoskeletal: Negative.   Neurological: Positive for weakness. Negative for dizziness and headaches.  Psychiatric/Behavioral: The patient is nervous/anxious (due to recent hospitalization).        Objective:   Physical Exam BP 118/66  Pulse 91  Temp(Src) 96.6 F (35.9 C) (Oral)  Ht 5\' 10"  (1.778 m)  Wt 174 lb (78.926 kg)  BMI 24.97 kg/m2  SpO2 97%  The patient appeared well nourished and normally developed, alert and oriented to time and place. Speech, behavior and judgement appear normal. Vital signs as documented.  Head exam is unremarkable. No scleral icterus or pallor noted. Mouth and throat appeared normal Neck is without jugular venous distension, thyromegally, or carotid bruits. Carotid upstrokes are brisk bilaterally. No cervical adenopathy. Lungs are clear anteriorly and posteriorly to auscultation. Normal respiratory effort. Cardiac exam reveals irregular irregular rate and rhythm @ 96/min. First and second heart sounds normal. No murmurs, rubs or gallops.  Abdominal exam reveals  no masses, no organomegaly and no aortic enlargement. No inguinal adenopathy. Extremities are nonedematous and both femoral and pedal pulses are normal. Skin with pallor but no  jaundice.  Warm and dry, without rash. Neurologic exam reveals normal deep tendon reflexes and normal sensation.           Assessment & Plan:  1. Atrial fibrillation, chronic- permanent - POCT INR  2. Acute on chronic combined systolic and diastolic CHF, NYHA class 3  3. Chronic anticoagulation, secondary to Chronic a. fib. - POCT INR  4. Chronic renal disease, stage III - BASIC METABOLIC PANEL WITH GFR  5. COPD (chronic obstructive pulmonary disease)  6. GERD (gastroesophageal reflux disease)  7. Gout - Uric acid  8. HYPERTENSIVE CARDIOVASCULAR DISEASE  9. Other malaise and fatigue - POCT CBC - Hepatic function panel - BASIC METABOLIC PANEL WITH GFR  10. SOB (shortness of breath) - POCT CBC - Brain natriuretic peptide - BASIC METABOLIC PANEL WITH GFR

## 2012-12-03 NOTE — Progress Notes (Shared)
Patient ID: [name] [name], male   DOB: Jul 13, 1926, 79 y.o.   MRN: [id number]          PROGRESS NOTE  DATE:   11/18/2012  FACILITY:  [facility]   LEVEL OF CARE:   SNF   Acute Visit   CHIEF COMPLAINT:  Follow up CHF and gout.    HISTORY OF PRESENT ILLNESS:  This is an 79 year-old man with a known ischemic cardiomyopathy.  He has an EF of 20-25%.  He has previously refused an IVC.  He has chronic atrial fibrillation, on coumadin.  His index hospitalization was dictated by heart failure.  He ruled out for an MI.  His AFib was controlled.  He was diuresed and he came here on lasix 40 mg a day.  His weights have been gradually decreasing from 181 to 178.    He tells me that last night he was lying in bed and he got a short of breath sensation similar to what he had when he came into the hospital.  This lasted 2-3 hours.  It was not associated with any form of chest pain that I could elicit, nausea or vomiting, or diaphoresis.  This did not radiate.  He did not tell anybody and this seems to have passed away on its own.    Finally, he came here with acute gout involving his metatarsal heads, right ankle and left knee.  His uric acid level was in the 5 range.  He is already on allopurinol and has been on colchicine in the past although I think he stopped this because of the price.     REVIEW OF SYSTEMS:   CHEST/RESPIRATORY:  Shortness of breath as described above.   CARDIAC:   No clear chest pain.  GI:  No nausea or vomiting.  No diarrhea.   MUSCULOSKELETAL:  Pain in his feet and ankle as described, much improved.  PHYSICAL EXAMINATION:   GENERAL APPEARANCE:  The patient is not in any distress.   CHEST/RESPIRATORY:  Clear air entry bilaterally.   CARDIOVASCULAR:  CARDIAC:   Heart sounds are irregular, compatible with known AFib.  There is no S3.  No signs of heart failure.   GASTROINTESTINAL:   ABDOMEN:  Soft.  No tenderness.   MUSCULOSKELETAL:   EXTREMITIES:   LEFT LOWER  EXTREMITY:  Still some complaints of pain in the left metatarsal heads.  All of this is much better.    ASSESSMENT/PLAN:  CHF.  I think this is actually stable.  His BNP is 1591.  However, I do not see a comparison value and I think he is clinically euvolemic at this point.   Therefore, I will continue his lasix at 40 b.i.d.  His BUN and creatinine are stable at 11 and 0.95, respectively.    Acute gout.  This is much improved on prednisone 20 and colchicine.  I am going to stop the prednisone after today's dose.  Continue him on colchicine at 0.6 b.i.d.  Although his uric acid level was 5.3, it is possible for an acute gout attack.  I will, therefore, check this in two weeks.    Chronic anticoagulation for atrial fibrillation.  His INR today is 0.96, which is unfortunate.  I am going to increase his coumadin to 5 mg.  He appears to have come here on 2.5.   CPT CODE: 13086.

## 2012-12-03 NOTE — Patient Instructions (Addendum)
Continue current meds Practice deep breathing at home Exercise legs and arms when possible to get strength back Continued therapy lifestyle changes

## 2012-12-04 ENCOUNTER — Encounter: Payer: Self-pay | Admitting: Family Medicine

## 2012-12-09 NOTE — Progress Notes (Signed)
Pt notified of result

## 2012-12-12 ENCOUNTER — Ambulatory Visit (INDEPENDENT_AMBULATORY_CARE_PROVIDER_SITE_OTHER): Payer: Medicare Other | Admitting: Pharmacist

## 2012-12-12 DIAGNOSIS — I4891 Unspecified atrial fibrillation: Secondary | ICD-10-CM

## 2012-12-12 LAB — POCT INR: INR: 2.4

## 2012-12-12 NOTE — Patient Instructions (Signed)
Anticoagulation Dose Instructions as of 12/12/2012     [name] [name] Tue Wed Thu Fri Sat   New Dose 2.5 mg 2.5 mg 2.5 mg 2.5 mg 2.5 mg 2.5 mg 2.5 mg    Description       Continue 2.5mg  daily [= 1/2 tablet daily]      INR was 2.4 today

## 2012-12-16 ENCOUNTER — Other Ambulatory Visit: Payer: Self-pay | Admitting: *Deleted

## 2012-12-16 ENCOUNTER — Encounter: Payer: Self-pay | Admitting: *Deleted

## 2012-12-16 MED ORDER — LORAZEPAM 0.5 MG PO TABS: 0.5000 mg | ORAL_TABLET | Freq: Every evening | ORAL | Status: DC | PRN

## 2012-12-16 NOTE — Telephone Encounter (Signed)
LAST REFILL 10/20/12.  LAST OV 10/19/12. ROUTE TO NURSE TO CALL IN TO [facility].

## 2012-12-18 NOTE — Telephone Encounter (Signed)
Called to [facility] 

## 2012-12-18 NOTE — Telephone Encounter (Signed)
PLEASE CALL IN. NOT DONE YET

## 2012-12-19 ENCOUNTER — Telehealth: Payer: Self-pay | Admitting: Family Medicine

## 2012-12-19 NOTE — Telephone Encounter (Signed)
Daughter will try to increase his fluid intake and check with pharmacy to be sure there are refills of Allopurinol.  If no refills, send request to [place].

## 2012-12-29 ENCOUNTER — Inpatient Hospital Stay (HOSPITAL_COMMUNITY)
Admission: EM | Admit: 2012-12-29 | Discharge: 2013-01-01 | DRG: 291 | Disposition: A | Discharge status: Home / Self Care | Payer: Medicare Other | Attending: Internal Medicine | Admitting: Internal Medicine

## 2012-12-29 ENCOUNTER — Emergency Department (HOSPITAL_COMMUNITY): Payer: Medicare Other

## 2012-12-29 ENCOUNTER — Encounter (HOSPITAL_COMMUNITY): Payer: Self-pay | Admitting: Emergency Medicine

## 2012-12-29 DIAGNOSIS — Z800 Family history of malignant neoplasm of digestive organs: Secondary | ICD-10-CM

## 2012-12-29 DIAGNOSIS — Z888 Allergy status to other drugs, medicaments and biological substances status: Secondary | ICD-10-CM

## 2012-12-29 DIAGNOSIS — I2789 Other specified pulmonary heart diseases: Secondary | ICD-10-CM | POA: Diagnosis present

## 2012-12-29 DIAGNOSIS — M109 Gout, unspecified: Secondary | ICD-10-CM | POA: Diagnosis present

## 2012-12-29 DIAGNOSIS — N183 Chronic kidney disease, stage 3 unspecified: Secondary | ICD-10-CM | POA: Diagnosis present

## 2012-12-29 DIAGNOSIS — I25810 Atherosclerosis of coronary artery bypass graft(s) without angina pectoris: Secondary | ICD-10-CM | POA: Diagnosis present

## 2012-12-29 DIAGNOSIS — E785 Hyperlipidemia, unspecified: Secondary | ICD-10-CM | POA: Diagnosis present

## 2012-12-29 DIAGNOSIS — J449 Chronic obstructive pulmonary disease, unspecified: Secondary | ICD-10-CM | POA: Diagnosis present

## 2012-12-29 DIAGNOSIS — I130 Hypertensive heart and chronic kidney disease with heart failure and stage 1 through stage 4 chronic kidney disease, or unspecified chronic kidney disease: Principal | ICD-10-CM | POA: Diagnosis present

## 2012-12-29 DIAGNOSIS — I2510 Atherosclerotic heart disease of native coronary artery without angina pectoris: Secondary | ICD-10-CM | POA: Diagnosis present

## 2012-12-29 DIAGNOSIS — I2589 Other forms of chronic ischemic heart disease: Secondary | ICD-10-CM | POA: Diagnosis present

## 2012-12-29 DIAGNOSIS — I509 Heart failure, unspecified: Secondary | ICD-10-CM | POA: Diagnosis present

## 2012-12-29 DIAGNOSIS — I714 Abdominal aortic aneurysm, without rupture, unspecified: Secondary | ICD-10-CM | POA: Diagnosis present

## 2012-12-29 DIAGNOSIS — J441 Chronic obstructive pulmonary disease with (acute) exacerbation: Secondary | ICD-10-CM | POA: Diagnosis present

## 2012-12-29 DIAGNOSIS — I252 Old myocardial infarction: Secondary | ICD-10-CM

## 2012-12-29 DIAGNOSIS — Z7901 Long term (current) use of anticoagulants: Secondary | ICD-10-CM

## 2012-12-29 DIAGNOSIS — E1129 Type 2 diabetes mellitus with other diabetic kidney complication: Secondary | ICD-10-CM | POA: Diagnosis present

## 2012-12-29 DIAGNOSIS — Z87891 Personal history of nicotine dependence: Secondary | ICD-10-CM

## 2012-12-29 DIAGNOSIS — I4891 Unspecified atrial fibrillation: Secondary | ICD-10-CM | POA: Diagnosis present

## 2012-12-29 DIAGNOSIS — Z833 Family history of diabetes mellitus: Secondary | ICD-10-CM

## 2012-12-29 DIAGNOSIS — I255 Ischemic cardiomyopathy: Secondary | ICD-10-CM | POA: Diagnosis present

## 2012-12-29 DIAGNOSIS — I5043 Acute on chronic combined systolic (congestive) and diastolic (congestive) heart failure: Secondary | ICD-10-CM | POA: Diagnosis present

## 2012-12-29 DIAGNOSIS — K219 Gastro-esophageal reflux disease without esophagitis: Secondary | ICD-10-CM | POA: Diagnosis present

## 2012-12-29 DIAGNOSIS — Z602 Problems related to living alone: Secondary | ICD-10-CM

## 2012-12-29 DIAGNOSIS — E1122 Type 2 diabetes mellitus with diabetic chronic kidney disease: Secondary | ICD-10-CM | POA: Diagnosis present

## 2012-12-29 DIAGNOSIS — Z886 Allergy status to analgesic agent status: Secondary | ICD-10-CM

## 2012-12-29 DIAGNOSIS — Z8249 Family history of ischemic heart disease and other diseases of the circulatory system: Secondary | ICD-10-CM

## 2012-12-29 LAB — BASIC METABOLIC PANEL
Calcium: 9.1 mg/dL (ref 8.4–10.5)
GFR calc Af Amer: 45 mL/min — ABNORMAL LOW (ref >90)
GFR calc non Af Amer: 39 mL/min — ABNORMAL LOW (ref >90)
Sodium: 140 mEq/L (ref 135–145)

## 2012-12-29 LAB — CBC WITH DIFFERENTIAL/PLATELET
Basophils Absolute: 0 10*3/uL (ref 0.0–0.1)
Basophils Relative: 0 % (ref 0–1)
Eosinophils Absolute: 0.2 10*3/uL (ref 0.0–0.7)
Eosinophils Relative: 2 % (ref 0–5)
Lymphocytes Relative: 24 % (ref 12–46)
MCH: 29.7 pg (ref 26.0–34.0)
MCHC: 33.8 g/dL (ref 30.0–36.0)
MCV: 88 fL (ref 78.0–100.0)
Platelets: 136 10*3/uL — ABNORMAL LOW (ref 150–400)
RDW: 15.6 % — ABNORMAL HIGH (ref 11.5–15.5)
WBC: 8.7 10*3/uL (ref 4.0–10.5)

## 2012-12-29 LAB — MAGNESIUM: Magnesium: 2 mg/dL (ref 1.5–2.5)

## 2012-12-29 LAB — TROPONIN I
Troponin I: <0.3 ng/mL (ref <0.30)
Troponin I: <0.3 ng/mL (ref <0.30)
Troponin I: <0.3 ng/mL (ref <0.30)

## 2012-12-29 LAB — APTT: aPTT: 43 seconds — ABNORMAL HIGH (ref 24–37)

## 2012-12-29 LAB — PRO B NATRIURETIC PEPTIDE: Pro B Natriuretic peptide (BNP): 7375 pg/mL — ABNORMAL HIGH (ref 0–450)

## 2012-12-29 MED ORDER — FUROSEMIDE 10 MG/ML IJ SOLN
80.0000 mg | Freq: Two times a day (BID) | INTRAMUSCULAR | Status: DC
  Administered 2012-12-29 – 2012-12-31 (×4): 80 mg via INTRAVENOUS
  Filled 2012-12-29 (×6): qty 8

## 2012-12-29 MED ORDER — CILOSTAZOL 50 MG PO TABS
50.0000 mg | ORAL_TABLET | Freq: Two times a day (BID) | ORAL | Status: DC
  Administered 2012-12-29 – 2013-01-01 (×6): 50 mg via ORAL
  Filled 2012-12-29 (×7): qty 1

## 2012-12-29 MED ORDER — CARVEDILOL 12.5 MG PO TABS
12.5000 mg | ORAL_TABLET | Freq: Two times a day (BID) | ORAL | Status: DC
  Administered 2012-12-29 – 2013-01-01 (×6): 12.5 mg via ORAL
  Filled 2012-12-29 (×8): qty 1

## 2012-12-29 MED ORDER — COLCHICINE 0.6 MG PO TABS
0.6000 mg | ORAL_TABLET | Freq: Every day | ORAL | Status: DC
  Administered 2012-12-30 – 2013-01-01 (×3): 0.6 mg via ORAL
  Filled 2012-12-29 (×3): qty 1

## 2012-12-29 MED ORDER — TERAZOSIN HCL 5 MG PO CAPS
5.0000 mg | ORAL_CAPSULE | Freq: Every day | ORAL | Status: DC
  Administered 2012-12-29 – 2012-12-31 (×3): 5 mg via ORAL
  Filled 2012-12-29 (×4): qty 1

## 2012-12-29 MED ORDER — CENTRUM SILVER ADULT 50+ PO TABS: 1.0000 | ORAL_TABLET | Freq: Every day | ORAL | Status: DC

## 2012-12-29 MED ORDER — NITROGLYCERIN 0.4 MG SL SUBL: 0.4000 mg | SUBLINGUAL_TABLET | SUBLINGUAL | Status: DC | PRN

## 2012-12-29 MED ORDER — WARFARIN - PHYSICIAN DOSING INPATIENT: Freq: Every day | Status: DC

## 2012-12-29 MED ORDER — SODIUM CHLORIDE 0.9 % IJ SOLN: 3.0000 mL | INTRAMUSCULAR | Status: DC | PRN

## 2012-12-29 MED ORDER — ASPIRIN 81 MG PO CHEW: 324.0000 mg | CHEWABLE_TABLET | ORAL | Status: AC

## 2012-12-29 MED ORDER — FUROSEMIDE 10 MG/ML IJ SOLN: 80.0000 mg | Freq: Two times a day (BID) | INTRAMUSCULAR | Status: DC

## 2012-12-29 MED ORDER — SODIUM CHLORIDE 0.9 % IV SOLN: 250.0000 mL | INTRAVENOUS | Status: DC | PRN

## 2012-12-29 MED ORDER — ACETAMINOPHEN 325 MG PO TABS: 650.0000 mg | ORAL_TABLET | ORAL | Status: DC | PRN

## 2012-12-29 MED ORDER — ALLOPURINOL 300 MG PO TABS
300.0000 mg | ORAL_TABLET | Freq: Every day | ORAL | Status: DC
  Administered 2012-12-29 – 2013-01-01 (×4): 300 mg via ORAL
  Filled 2012-12-29 (×4): qty 1

## 2012-12-29 MED ORDER — ONDANSETRON HCL 4 MG/2ML IJ SOLN: 4.0000 mg | Freq: Four times a day (QID) | INTRAMUSCULAR | Status: DC | PRN

## 2012-12-29 MED ORDER — ADULT MULTIVITAMIN W/MINERALS CH
1.0000 | ORAL_TABLET | Freq: Every day | ORAL | Status: DC
  Administered 2012-12-29 – 2013-01-01 (×4): 1 via ORAL
  Filled 2012-12-29 (×4): qty 1

## 2012-12-29 MED ORDER — VITAMIN D3 25 MCG (1000 UNIT) PO TABS
1000.0000 [IU] | ORAL_TABLET | Freq: Every day | ORAL | Status: DC
  Administered 2012-12-29 – 2013-01-01 (×4): 1000 [IU] via ORAL
  Filled 2012-12-29 (×4): qty 1

## 2012-12-29 MED ORDER — LORAZEPAM 0.5 MG PO TABS
0.5000 mg | ORAL_TABLET | Freq: Every evening | ORAL | Status: DC | PRN
Start: 2012-12-29 — End: 2013-01-01

## 2012-12-29 MED ORDER — FUROSEMIDE 10 MG/ML IJ SOLN
80.0000 mg | Freq: Once | INTRAMUSCULAR | Status: AC
  Administered 2012-12-29: 80 mg via INTRAVENOUS
  Filled 2012-12-29: qty 8

## 2012-12-29 MED ORDER — ASPIRIN EC 81 MG PO TBEC
81.0000 mg | DELAYED_RELEASE_TABLET | Freq: Every day | ORAL | Status: DC
  Administered 2012-12-30 – 2013-01-01 (×3): 81 mg via ORAL
  Filled 2012-12-29 (×3): qty 1

## 2012-12-29 MED ORDER — ASPIRIN 300 MG RE SUPP
300.0000 mg | RECTAL | Status: AC
  Filled 2012-12-29: qty 1

## 2012-12-29 MED ORDER — ATORVASTATIN CALCIUM 80 MG PO TABS
80.0000 mg | ORAL_TABLET | Freq: Every day | ORAL | Status: DC
Start: 2012-12-29 — End: 2013-01-01
  Administered 2012-12-29 – 2012-12-31 (×3): 80 mg via ORAL
  Filled 2012-12-29 (×4): qty 1

## 2012-12-29 MED ORDER — SODIUM CHLORIDE 0.9 % IJ SOLN
3.0000 mL | Freq: Two times a day (BID) | INTRAMUSCULAR | Status: DC
  Administered 2012-12-29 – 2013-01-01 (×5): 3 mL via INTRAVENOUS

## 2012-12-29 MED ORDER — WARFARIN SODIUM 2.5 MG PO TABS
2.5000 mg | ORAL_TABLET | Freq: Every day | ORAL | Status: DC
  Administered 2012-12-29 – 2012-12-31 (×3): 2.5 mg via ORAL
  Filled 2012-12-29 (×4): qty 1

## 2012-12-29 MED ORDER — POTASSIUM CHLORIDE CRYS ER 20 MEQ PO TBCR
20.0000 meq | EXTENDED_RELEASE_TABLET | Freq: Two times a day (BID) | ORAL | Status: DC
  Administered 2012-12-29 – 2013-01-01 (×6): 20 meq via ORAL
  Filled 2012-12-29 (×6): qty 1

## 2012-12-29 NOTE — H&P (Signed)
[name] [name] is an 79 y.o. male.    Primary Cardiologist : Dr. [name] PCP:  [name], MD  Chief Complaint: SOB HPI: 79 y.o. male with relatively complex med h/o as below, presented to ER today with SOB.  Last night he could not sleep secondary to SOB.  He still sleep on his usual 2 pillows but the SOB would waken him.    NO Chest pain, no radiating arm pain. Occ. Cough-clear mucus, no cold, no fever-no sick contacts. Not noticed any LE swelling. NO recent gain in weight, though he only weighs every few days.  Here in ER after Lasix IV he is feeling better.   Last cath 10/02/11- with hx CABG in 1999. Cath revealed EF of 15-20%, moderate pul. HTN with PA pressure of 52 mm, significant known ascending aortic aneurysm with 2+ aortic insuff. Significant native CAD. Patent LIMA to mid LAD with small distal LAD with 40-50% narrowing, Patent initial limb of the vein graft supplying a diagonal vessel, but without visualization of the sequential limb which previously supplied a ramus intermediate vessel. Occluded vein graft to the right coronary artery. Patent initial limb of the vein graft supplying the posterior descending artery with occlusion of the previous sequential limb supplying the posterolateral artery 1 and posterolateral artery 2 vessel. It was felt that the patient's significant reduction in LV function from his prior assessment most likely is due to graft occlusion leading to his diffuse severe global hypocontractility. Medical therapy recommended. Pt refused ICD. Echo 12/2011 EF 25-30%.   Last hospitalization complicated by acute gout attack.   Past Medical History  Diagnosis Date  . HTN (hypertension)   . GERD (gastroesophageal reflux disease)   . Hyperlipidemia   . Hypertensive cardiovascular disease   . CAD (coronary artery disease)     hx of CABG, with graft dysfunction  . MI (myocardial infarction) 1989  . Atrial fibrillation 1/09    s/p DCCV to NSR -now chronic  . Pulmonary  granuloma     left  . Ascending aortic aneurysm     5.6 in 2013, stable at that time  . COPD (chronic obstructive pulmonary disease)   . Shortness of breath   . Ischemic cardiomyopathy 10/02/2011    EF 25-30% on echo 5/13  . CHF (congestive heart failure)   . Hematuria   . Bleeding gastrointestinal   . Diabetes mellitus without complication   . Gout     Past Surgical History  Procedure Laterality Date  . Stomach surgery    . Mastoidectomy      right ear  . Cardiac catheterization  10/01/2011  . Coronary artery bypass graft  12/1994    LIMA-LAD, VG-diag; VG-PDA; occl. VG to PLA,occluded VG to LCX   . Pr vein bypass graft,aorto-fem-pop  1996  . Vagotomy  1973    Family History  Problem Relation Age of Onset  . Liver cancer Sister   . Hypertension Sister   . Cancer Sister   . Diabetes Sister   . Hypertension Father   . Heart disease Father   . Hyperlipidemia Father   . Heart disease Mother    Social History:  reports that he quit smoking about 25 years ago. His smoking use included Cigarettes and Cigars. He started smoking about 70 years ago. He has a 22.5 pack-year smoking history. He has never used smokeless tobacco. He reports that he does not drink alcohol or use illicit drugs.  Lives alone uses a walker to walk.  Allergies:  Allergies  Allergen Reactions  . Ace Inhibitors     Cough  . Cimetidine      unknown reaction, pt states per Dr. Jarold Motto  . Ciprofloxacin Other (See Comments)    unknown  . Nsaids     Dr does not want patient to take due to kidneys  . Zantac (Ranitidine Hcl)     Hepatitis per Dr. Jarold Motto    Out patient Medications: Klor con 20 meq BID Allopurinal 300 mg daily Lorazepam 0.5 mg at HS and prn colcrys 0.6 mg 2 tabs at onset of gout and repeat in 2 hours, prn Coumadin 5 mg half a tab daily Furosemide 40 mg BID Coreg 12.5 mg BID Cilostazol 50 mg BID Atoravastatin 80 mg dialy Vit D3 1000 iu daily Centrum silver MVI daily Terazosin 5  mg at HS     Results for orders placed during the hospital encounter of 12/29/12 (from the past 48 hour(s))  CBC WITH DIFFERENTIAL     Status: Abnormal   Collection Time    12/29/12 10:15 AM      Result Value Range   WBC 8.7  4.0 - 10.5 K/uL   RBC 4.24  4.22 - 5.81 MIL/uL   Hemoglobin 12.6 (*) 13.0 - 17.0 g/dL   HCT 16.1 (*) 09.6 - 04.5 %   MCV 88.0  78.0 - 100.0 fL   MCH 29.7  26.0 - 34.0 pg   MCHC 33.8  30.0 - 36.0 g/dL   RDW 40.9 (*) 81.1 - 91.4 %   Platelets 136 (*) 150 - 400 K/uL   Neutrophils Relative 65  43 - 77 %   Neutro Abs 5.7  1.7 - 7.7 K/uL   Lymphocytes Relative 24  12 - 46 %   Lymphs Abs 2.1  0.7 - 4.0 K/uL   Monocytes Relative 9  3 - 12 %   Monocytes Absolute 0.8  0.1 - 1.0 K/uL   Eosinophils Relative 2  0 - 5 %   Eosinophils Absolute 0.2  0.0 - 0.7 K/uL   Basophils Relative 0  0 - 1 %   Basophils Absolute 0.0  0.0 - 0.1 K/uL  BASIC METABOLIC PANEL     Status: Abnormal   Collection Time    12/29/12 10:15 AM      Result Value Range   Sodium 140  135 - 145 mEq/L   Potassium 3.5  3.5 - 5.1 mEq/L   Chloride 103  96 - 112 mEq/L   CO2 27  19 - 32 mEq/L   Glucose, Bld 136 (*) 70 - 99 mg/dL   BUN 28 (*) 6 - 23 mg/dL   Creatinine, Ser 7.82 (*) 0.50 - 1.35 mg/dL   Calcium 9.1  8.4 - 95.6 mg/dL   GFR calc non Af Amer 39 (*) >90 mL/min   GFR calc Af Amer 45 (*) >90 mL/min   Comment:            The eGFR has been calculated     using the CKD EPI equation.     This calculation has not been     validated in all clinical     situations.     eGFR's persistently     <90 mL/min signify     possible Chronic Kidney Disease.  TROPONIN I     Status: None   Collection Time    12/29/12 10:15 AM      Result Value Range   Troponin I <0.30  <  0.30 ng/mL   Comment:            Due to the release kinetics of cTnI,     a negative result within the first hours     of the onset of symptoms does not rule out     myocardial infarction with certainty.     If myocardial  infarction is still suspected,     repeat the test at appropriate intervals.  PRO B NATRIURETIC PEPTIDE     Status: Abnormal   Collection Time    12/29/12 10:15 AM      Result Value Range   Pro B Natriuretic peptide (BNP) 7375.0 (*) 0 - 450 pg/mL  PROTIME-INR     Status: Abnormal   Collection Time    12/29/12 10:15 AM      Result Value Range   Prothrombin Time 29.4 (*) 11.6 - 15.2 seconds   INR 2.98 (*) 0.00 - 1.49   Dg Chest 2 View  12/29/2012  *RADIOLOGY REPORT*  Clinical Data: Shortness of breath, congestive heart failure  CHEST - 2 VIEW  Comparison: 11/02/2012  Findings: Cardiomediastinal silhouette is stable.  Status post CABG again noted.  Stable chronic interstitial prominence and bilateral pleuroparenchymal scarring.  No definite superimposed infiltrate or convincing pulmonary edema. Old right rib fractures are again noted.  IMPRESSION: Stable chronic interstitial prominence and bilateral pleuroparenchymal scarring.  No definite superimposed infiltrate or convincing pulmonary edema.   Original Report Authenticated By: Natasha Mead, M.D.     ROS: General:no colds or fevers, no weight changes Skin:no rashes or ulcers HEENT:no blurred vision, no congestion CV:see HPI PUL:see HPI GI:no diarrhea constipation or melena, no indigestion GU:no hematuria, no dysuria MS:no joint pain, no claudication. + gout Neuro:no syncope, no lightheadedness Endo:diabetes- diet controlled, no thyroid disease   Blood pressure 146/86, pulse 93, temperature 97.7 F (36.5 C), temperature source Oral, resp. rate 31, SpO2 99.00%. PE: General:alert and oriented pleasant affect, NAD currently after lasix Skin:warm and dry, brisk capillary refill HEENT:normocephlaic sclera clear Neck:supple, mild JVD, no carotid bruits Heart:irreg irreg soft systolic murmur, no gallup or rub Lungs:few rales in bases, no wheezes Abd:+ BS, soft, non tender Ext:no edema 1+ pedal pulses bil. Neuro:alert and oriented X 3, MAE,  follows commands, + facial symmetry   EKG Atrial fib without acute changes  Assessment/Plan Principal Problem:   Acute on chronic combined systolic and diastolic CHF, NYHA class 3 Active Problems:   Ischemic cardiomyopathy, EF 20-25% by 2D Feb 2012   HYPERLIPIDEMIA    CABG 1999, Cath Fen 2013 with patent grafts and distal disease -refused life vest and ICD   Atrial fibrillation, chronic- permanent   Chronic anticoagulation, secondary to Chronic a. fib.   Gout   COPD (chronic obstructive pulmonary disease)   AAA (abdominal aortic aneurysm) 5.6cm by CT 7/12  PLAN:admit to tele, IV lasix, add daily colcrys to prevent acute gout.  See Dr. Blanchie Dessert note  [email or web address],BJYNW R Nurse Practitioner Certified [facility] Pager [phone] 12/29/2012, 2:19 PM

## 2012-12-29 NOTE — ED Notes (Signed)
Cardiology MD at bedside.

## 2012-12-29 NOTE — ED Provider Notes (Signed)
History     CSN: [id number]  Arrival date & time 12/29/12  0957   First MD Initiated Contact with Patient 12/29/12 1002      Chief Complaint  Patient presents with  . Shortness of Breath    (Consider location/radiation/quality/duration/timing/severity/associated sxs/prior treatment) Patient is a 79 y.o. male presenting with shortness of breath.  Shortness of Breath  Pt with history of multiple medical problems including CAD and CHF reports 2 days of increasing SOB, worse with lying flat and difficulty sleeping last night. He has had mild dyspnea with exertion and with talking. Occasional dry cough, no CP, fever, nausea or vomiting. He denies any LE edema or weight gain. Has been taking his meds as described as well as an extra half tab of Lasix without improvement.   Past Medical History  Diagnosis Date  . HTN (hypertension)   . GERD (gastroesophageal reflux disease)   . Hyperlipidemia   . Hypertensive cardiovascular disease   . CAD (coronary artery disease)     hx of CABG, with graft dysfunction  . MI (myocardial infarction) 1989  . Atrial fibrillation 1/09    s/p DCCV to NSR -now chronic  . Pulmonary granuloma     left  . Ascending aortic aneurysm   . COPD (chronic obstructive pulmonary disease)   . Shortness of breath   . CAD (coronary artery disease) of bypass graft,SVG to RCA occluded, Seq. limb of SVG to PLA1 &PLA2 occluded, Seq. VG to Ramus occluded, PATENT LIMA to LAD, Patent VG to DX1, Patent Vg to PDA 10/02/11 10/02/2011  . Ischemic cardiomyopathy 10/02/2011  . CHF (congestive heart failure)   . Hematuria   . Bleeding gastrointestinal   . Diabetes mellitus without complication   . Gout     Past Surgical History  Procedure Laterality Date  . Stomach surgery    . Mastoidectomy      right ear  . Cardiac catheterization  10/01/2011  . Coronary artery bypass graft  12/1994  . Pr vein bypass graft,aorto-fem-pop  1996  . Vagotomy  1973    Family History  Problem  Relation Age of Onset  . Liver cancer Sister   . Hypertension Sister   . Cancer Sister   . Diabetes Sister   . Hypertension Father   . Heart disease Father   . Hyperlipidemia Father   . Heart disease Mother     History  Substance Use Topics  . Smoking status: Former Smoker -- 0.50 packs/day for 45 years    Types: Cigarettes, Cigars    Start date: 08/28/1942    Quit date: 12/26/1987  . Smokeless tobacco: Never Used  . Alcohol Use: No     Comment: Occas Beer      Review of Systems  Respiratory: Positive for shortness of breath.    All other systems reviewed and are negative except as noted in HPI.   Allergies  Ace inhibitors; Cimetidine; Ciprofloxacin; Nsaids; and Zantac  Home Medications   Current Outpatient Rx  Name  Route  Sig  Dispense  Refill  . acetaminophen (TYLENOL) 500 MG tablet   Oral   Take 1,000 mg by mouth every 6 (six) hours as needed for pain.         Marland Kitchen allopurinol (ZYLOPRIM) 300 MG tablet   Oral   Take 300 mg by mouth daily.         Marland Kitchen atorvastatin (LIPITOR) 80 MG tablet   Oral   Take 80 mg by mouth daily.         Marland Kitchen  carvedilol (COREG) 6.25 MG tablet   Oral   Take 1 tablet (6.25 mg total) by mouth 2 (two) times daily with a meal.   60 tablet   5   . cilostazol (PLETAL) 50 MG tablet   Oral   Take 50 mg by mouth 2 (two) times daily.          . colchicine (COLCRYS) 0.6 MG tablet   Oral   Take 0.6 mg by mouth 2 (two) times daily as needed (for acute gout flare).         . furosemide (LASIX) 40 MG tablet   Oral   Take 40 mg by mouth 2 (two) times daily.         Marland Kitchen LORazepam (ATIVAN) 0.5 MG tablet   Oral   Take 1 tablet (0.5 mg total) by mouth at bedtime as needed for anxiety (to help sleep). For anxiety   30 tablet   2   . Multiple Vitamin (MULTIVITAMIN WITH MINERALS) TABS   Oral   Take 1 tablet by mouth daily.         Marland Kitchen olmesartan (BENICAR) 20 MG tablet   Oral   Take 10 mg by mouth daily.          Marland Kitchen terazosin  (HYTRIN) 5 MG capsule   Oral   Take 5 mg by mouth at bedtime.           Marland Kitchen warfarin (COUMADIN) 5 MG tablet   Oral   Take 2.5 mg by mouth daily.            BP 142/90  Pulse 88  Temp(Src) 97.7 F (36.5 C) (Oral)  Resp 20  SpO2 97%  Physical Exam  Nursing note and vitals reviewed. Constitutional: He is oriented to person, place, and time. He appears well-developed and well-nourished.  HENT:  Head: Normocephalic and atraumatic.  Eyes: EOM are normal. Pupils are equal, round, and reactive to light.  Neck: Normal range of motion. Neck supple.  Cardiovascular: Normal rate, normal heart sounds and intact distal pulses.   Pulmonary/Chest: Effort normal and breath sounds normal.  Abdominal: Bowel sounds are normal. He exhibits no distension. There is no tenderness.  Musculoskeletal: Normal range of motion. He exhibits no edema and no tenderness.  Neurological: He is alert and oriented to person, place, and time. He has normal strength. No cranial nerve deficit or sensory deficit.  Skin: Skin is warm and dry. No rash noted.  Psychiatric: He has a normal mood and affect.    ED Course  Procedures (including critical care time)  Labs Reviewed  CBC WITH DIFFERENTIAL - Abnormal; Notable for the following:    Hemoglobin 12.6 (*)    HCT 37.3 (*)    RDW 15.6 (*)    Platelets 136 (*)    All other components within normal limits  BASIC METABOLIC PANEL - Abnormal; Notable for the following:    Glucose, Bld 136 (*)    BUN 28 (*)    Creatinine, Ser 1.56 (*)    GFR calc non Af Amer 39 (*)    GFR calc Af Amer 45 (*)    All other components within normal limits  PRO B NATRIURETIC PEPTIDE - Abnormal; Notable for the following:    Pro B Natriuretic peptide (BNP) 7375.0 (*)    All other components within normal limits  PROTIME-INR - Abnormal; Notable for the following:    Prothrombin Time 29.4 (*)    INR 2.98 (*)  All other components within normal limits  TROPONIN I   Dg Chest 2  View  12/29/2012  *RADIOLOGY REPORT*  Clinical Data: Shortness of breath, congestive heart failure  CHEST - 2 VIEW  Comparison: 11/02/2012  Findings: Cardiomediastinal silhouette is stable.  Status post CABG again noted.  Stable chronic interstitial prominence and bilateral pleuroparenchymal scarring.  No definite superimposed infiltrate or convincing pulmonary edema. Old right rib fractures are again noted.  IMPRESSION: Stable chronic interstitial prominence and bilateral pleuroparenchymal scarring.  No definite superimposed infiltrate or convincing pulmonary edema.   Original Report Authenticated By: Natasha Mead, M.D.      1. CHF exacerbation       MDM   Date: 12/29/2012  Rate: 91  Rhythm: atrial fibrillation and premature ventricular contractions (PVC)  QRS Axis: left  Intervals: normal  ST/T Wave abnormalities: nonspecific ST changes  Conduction Disutrbances:left bundle branch block, incomplete  Narrative Interpretation:   Old EKG Reviewed: unchanged  12:20 PM BNP elevated with vascular congestion on CXR.Given Lasix 80mg  IV, admit for diuresis. [facility] to see in the ED.         Charles B. Bernette Mayers, MD 12/29/12 1221

## 2012-12-29 NOTE — Progress Notes (Signed)
ANTICOAGULATION CONSULT NOTE - Initial Consult  Pharmacy Consult for Coumadin Indication: atrial fibrillation  Allergies  Allergen Reactions  . Ace Inhibitors     Cough  . Cimetidine      unknown reaction, pt states per Dr. Jarold Motto  . Ciprofloxacin Other (See Comments)    unknown  . Nsaids     Dr does not want patient to take due to kidneys  . Zantac (Ranitidine Hcl)     Hepatitis per Dr. Jarold Motto    Patient Measurements: Height: 5\' 11"  (180.3 cm) Weight: 168 lb 6.9 oz (76.4 kg) IBW/kg (Calculated) : 75.3 Heparin Dosing Weight:    Vital Signs: Temp: 97.4 F (36.3 C) (05/04 1610) Temp src: Oral (05/04 1014) BP: 136/80 mmHg (05/04 1610) Pulse Rate: 84 (05/04 1610)  Labs:  Recent Labs  12/29/12 1015  HGB 12.6*  HCT 37.3*  PLT 136*  LABPROT 29.4*  INR 2.98*  CREATININE 1.56*  TROPONINI <0.30    Estimated Creatinine Clearance: 37.5 ml/min (by C-G formula based on Cr of 1.56).   Medical History: Past Medical History  Diagnosis Date  . HTN (hypertension)   . GERD (gastroesophageal reflux disease)   . Hyperlipidemia   . Hypertensive cardiovascular disease   . CAD (coronary artery disease)     hx of CABG, with graft dysfunction  . MI (myocardial infarction) 1989  . Atrial fibrillation 1/09    s/p DCCV to NSR -now chronic  . Pulmonary granuloma     left  . Ascending aortic aneurysm     5.6 in 2013, stable at that time  . COPD (chronic obstructive pulmonary disease)   . Shortness of breath   . Ischemic cardiomyopathy 10/02/2011    EF 25-30% on echo 5/13  . CHF (congestive heart failure)   . Hematuria   . Bleeding gastrointestinal   . Diabetes mellitus without complication   . Gout     Medications:  Prescriptions prior to admission  Medication Sig Dispense Refill  . allopurinol (ZYLOPRIM) 300 MG tablet Take 300 mg by mouth daily.      Marland Kitchen atorvastatin (LIPITOR) 80 MG tablet Take 80 mg by mouth daily.      . carvedilol (COREG) 6.25 MG tablet Take 1  tablet (6.25 mg total) by mouth 2 (two) times daily with a meal.  60 tablet  5  . cholecalciferol (VITAMIN D) 1000 UNITS tablet Take 1,000 Units by mouth daily.      . cilostazol (PLETAL) 50 MG tablet Take 50 mg by mouth 2 (two) times daily.       . colchicine (COLCRYS) 0.6 MG tablet Take 0.6 mg by mouth 2 (two) times daily as needed (for acute gout flare).      . furosemide (LASIX) 40 MG tablet Take 40 mg by mouth 2 (two) times daily.      Marland Kitchen LORazepam (ATIVAN) 0.5 MG tablet Take 1 tablet (0.5 mg total) by mouth at bedtime as needed for anxiety (to help sleep). For anxiety  30 tablet  2  . Multiple Vitamins-Minerals (CENTRUM SILVER ADULT 50+) TABS Take 1 tablet by mouth daily.      . potassium chloride SA (K-DUR,KLOR-CON) 20 MEQ tablet Take 20 mEq by mouth 2 (two) times daily.      Marland Kitchen terazosin (HYTRIN) 5 MG capsule Take 5 mg by mouth at bedtime.        Marland Kitchen warfarin (COUMADIN) 5 MG tablet Take 2.5 mg by mouth daily.  Assessment: SOB 79 y/o M with significant PMH including known CAD, Afib, ICM/CHF (EF 20-25%), AAA presents with CHF exac, possible COPD exac, and gout.  Anticoag: Continue Coumadin as PTA for afib. INR 2.98 in goal. Noted pt does have a h/o GIB.   Goal of Therapy:  INR 2-3 Monitor platelets by anticoagulation protocol: Yes   Plan:  Continue Coumadin 2.5mg  po daily Daily PT/INR  Misty Stanley Stillinger 12/29/2012,4:30 PM

## 2012-12-29 NOTE — Progress Notes (Signed)
Pt transferred from ED via stretcher pt arrived to 4715, placed on tele no complaints. Given HF booklet and safety teaching done, verabalized understanding. bed alarm set. instuctcted pt to call for assistance.

## 2012-12-29 NOTE — ED Notes (Signed)
Received pt from home via EMS with c/o difficulty breathing with exertion. Pt has history of same for 1 year. Pt seen here last month for the same. Pt began feeling bad yesterday.

## 2012-12-29 NOTE — Progress Notes (Signed)
Pt. Stated his Benicar was stopped as outpatient.  Will restart if no rise in Cr. In am.

## 2012-12-29 NOTE — H&P (Signed)
Pt. Seen and examined. Agree with the NP/PA-C note as written.  Well known to [place], history of CAD s/p CABG, CHF, persistent if not permanent a-fib.  Admitted in March for CHF exacerbation (EF 15-20%).  He was diuresed and sent to [facility] for rehab.  He had a gout flare there, despite a normal uric acid level. He has refused AICD in discussion with Dr. Royann Shivers in the past. He now describes 3-4 days of increasing shortness of breath, non-productive cough, but actually a few pounds of weight loss.  He is also concerned about ear fullness on both sides causing him to hear his pulse. Exam notable for irregularly irregular rhythm, decreased breath sounds but no rales, trace LE edema, TM's pearly and canal clear, JVP to 7 cmH20, sclerae anicteric, dry mucous membranes, normal mood and affect, follows commands, A&Ox3.  BNP is elevated compared to recent discharge. CXR shows interstitial prominence and pulmonary vascular congestion, but no overt edema or effusion. EKG is at baseline.  Impression:  1. Acute on chronic systolic congestive heart failure, NYHA Class IV, EF 10-15% 2. Possible mild COPD exacerbation 3. History of gout flare with diuresis.  Plan: 1. Admit to 4700 stepdown. Telemetry. Diuresis with IV lasix. 2. Continue allopurinol and add daily colcyrs 0.6 mg for gout prevention. 3. Creatinine is lower than baseline of 1.7, suspect due to volume overload. 4. Suspect that he is not far off baseline with regards to HF - hopefully, we can minimize the duration of his hopsitalization.  Chrystie Nose, MD, [facility] Attending Cardiologist [facility]

## 2012-12-30 LAB — CBC
HCT: 37.5 % — ABNORMAL LOW (ref 39.0–52.0)
MCH: 29.5 pg (ref 26.0–34.0)
MCHC: 33.6 g/dL (ref 30.0–36.0)
MCV: 87.8 fL (ref 78.0–100.0)
RDW: 15.8 % — ABNORMAL HIGH (ref 11.5–15.5)
WBC: 7.3 10*3/uL (ref 4.0–10.5)

## 2012-12-30 LAB — BASIC METABOLIC PANEL
BUN: 32 mg/dL — ABNORMAL HIGH (ref 6–23)
Calcium: 9.3 mg/dL (ref 8.4–10.5)
Chloride: 103 mEq/L (ref 96–112)
Creatinine, Ser: 1.64 mg/dL — ABNORMAL HIGH (ref 0.50–1.35)
GFR calc Af Amer: 43 mL/min — ABNORMAL LOW (ref >90)

## 2012-12-30 LAB — HEMOGLOBIN A1C
Hgb A1c MFr Bld: 6.5 % — ABNORMAL HIGH (ref <5.7)
Mean Plasma Glucose: 140 mg/dL — ABNORMAL HIGH (ref <117)

## 2012-12-30 LAB — TROPONIN I
Troponin I: <0.3 ng/mL (ref <0.30)
Troponin I: <0.3 ng/mL (ref <0.30)

## 2012-12-30 LAB — HEPATIC FUNCTION PANEL
ALT: 10 U/L (ref 0–53)
AST: 17 U/L (ref 0–37)
Bilirubin, Direct: 0.3 mg/dL (ref 0.0–0.3)
Total Bilirubin: 1.3 mg/dL — ABNORMAL HIGH (ref 0.3–1.2)

## 2012-12-30 LAB — LIPID PANEL
Cholesterol: 141 mg/dL (ref 0–200)
LDL Cholesterol: 75 mg/dL (ref 0–99)
Total CHOL/HDL Ratio: 2.7 RATIO
VLDL: 13 mg/dL (ref 0–40)

## 2012-12-30 MED ORDER — HYDRALAZINE HCL 25 MG PO TABS
12.5000 mg | ORAL_TABLET | Freq: Three times a day (TID) | ORAL | Status: DC
  Administered 2012-12-30 – 2013-01-01 (×6): 12.5 mg via ORAL
  Filled 2012-12-30 (×9): qty 0.5

## 2012-12-30 MED ORDER — GLUCERNA SHAKE PO LIQD
237.0000 mL | ORAL | Status: DC
  Administered 2012-12-30 – 2012-12-31 (×2): 237 mL via ORAL

## 2012-12-30 MED ORDER — ISOSORBIDE MONONITRATE 15 MG HALF TABLET
15.0000 mg | ORAL_TABLET | Freq: Every day | ORAL | Status: DC
  Administered 2012-12-30 – 2013-01-01 (×3): 15 mg via ORAL
  Filled 2012-12-30 (×4): qty 1

## 2012-12-30 NOTE — Progress Notes (Signed)
INITIAL NUTRITION ASSESSMENT  DOCUMENTATION CODES Per approved criteria  -Not Applicable   INTERVENTION: 1. Glucerna Shake po daily, each supplement provides 220 kcal and 10 grams of protein.   NUTRITION DIAGNOSIS: Inadequate oral intake related to decreased appetite as evidenced by weight loss.   Goal: PO intake to meet >/=90% estimated nutrition needs  Monitor:  PO intake, weight trends, labs, I/O's  Reason for Assessment: Malnutrition Screening Tool   79 y.o. male  Admitting Dx: Acute on chronic combined systolic and diastolic CHF, NYHA class 3  ASSESSMENT: Pt admitted with CHF. Has had increased SOB bringing him to the ED.  Pt states that he has lost 5-6 lbs in the past 2 months. Has had little appetite at home, but has improved while here. Poor appetite may be related to fluid status. Pt also reports some sadness over the loss of his wife (five years ago in [place], recently would have been their 60th wedding anniversary.)  Weight hx shows weight loss of 15 lbs in the past 2 months, 8% body weight. Some weight loss likely related to fluids off with lasix.   Height: Ht Readings from Last 1 Encounters:  12/29/12 5\' 11"  (1.803 m)    Weight: Wt Readings from Last 1 Encounters:  12/30/12 165 lb 9.6 oz (75.116 kg)    Ideal Body Weight: 172 lbs   % Ideal Body Weight: 96%  Wt Readings from Last 10 Encounters:  12/30/12 165 lb 9.6 oz (75.116 kg)  12/03/12 174 lb (78.926 kg)  11/06/12 180 lb 12.4 oz (82 kg)  11/02/12 186 lb 3.2 oz (84.46 kg)  07/03/12 185 lb (83.915 kg)  04/18/12 182 lb (82.555 kg)  12/27/11 181 lb (82.101 kg)  10/08/11 180 lb 8.9 oz (81.9 kg)  10/08/11 180 lb 8.9 oz (81.9 kg)  09/26/11 194 lb 9.6 oz (88.27 kg)    Usual Body Weight: 180 lbs   % Usual Body Weight: 92%  BMI:  Body mass index is 23.11 kg/(m^2). WNL   Estimated Nutritional Needs: Kcal: 1800-2000 Protein: 90-100 gm  Fluid: 1.5 L   Skin: intact   Diet Order:  Cardiac  EDUCATION NEEDS: -No education needs identified at this time Pt has been educated on a low sodium diet in the past by this RD.    Intake/Output Summary (Last 24 hours) at 12/30/12 1516 Last data filed at 12/30/12 1503  Gross per 24 hour  Intake    840 ml  Output   1975 ml  Net  -1135 ml    Last BM: 5/5    Labs:   Recent Labs Lab 12/29/12 1015 12/29/12 1645 12/30/12 0430  NA 140  --  142  K 3.5  --  3.9  CL 103  --  103  CO2 27  --  30  BUN 28*  --  32*  CREATININE 1.56*  --  1.64*  CALCIUM 9.1  --  9.3  MG  --  2.0  --   GLUCOSE 136*  --  123*    CBG (last 3)  No results found for this basename: GLUCAP,  in the last 72 hours  Scheduled Meds: . allopurinol  300 mg Oral Daily  . aspirin  324 mg Oral NOW   Or  . aspirin  300 mg Rectal NOW  . aspirin EC  81 mg Oral Daily  . atorvastatin  80 mg Oral q1800  . carvedilol  12.5 mg Oral BID WC  . cholecalciferol  1,000 Units Oral  Daily  . cilostazol  50 mg Oral BID  . colchicine  0.6 mg Oral Daily  . furosemide  80 mg Intravenous Q12H  . hydrALAZINE  12.5 mg Oral Q8H  . isosorbide mononitrate  15 mg Oral Daily  . multivitamin with minerals  1 tablet Oral Daily  . potassium chloride SA  20 mEq Oral BID  . sodium chloride  3 mL Intravenous Q12H  . terazosin  5 mg Oral QHS  . warfarin  2.5 mg Oral q1800    Continuous Infusions:   Past Medical History  Diagnosis Date  . HTN (hypertension)   . GERD (gastroesophageal reflux disease)   . Hyperlipidemia   . Hypertensive cardiovascular disease   . CAD (coronary artery disease)     hx of CABG, with graft dysfunction  . MI (myocardial infarction) 1989  . Atrial fibrillation 1/09    s/p DCCV to NSR -now chronic  . Pulmonary granuloma     left  . Ascending aortic aneurysm     5.6 in 2013, stable at that time  . COPD (chronic obstructive pulmonary disease)   . Shortness of breath   . Ischemic cardiomyopathy 10/02/2011    EF 25-30% on echo 5/13  . CHF  (congestive heart failure)   . Hematuria   . Bleeding gastrointestinal   . Diabetes mellitus without complication   . Gout     Past Surgical History  Procedure Laterality Date  . Stomach surgery    . Mastoidectomy      right ear  . Cardiac catheterization  10/01/2011  . Coronary artery bypass graft  12/1994    LIMA-LAD, VG-diag; VG-PDA; occl. VG to PLA,occluded VG to LCX   . Pr vein bypass graft,aorto-fem-pop  1996  . Vagotomy  1973    Clarene Duke RD, [place] Pager [phone] After Hours pager [phone]

## 2012-12-30 NOTE — Progress Notes (Signed)
Requested Imdur 2x from pharm

## 2012-12-30 NOTE — Progress Notes (Signed)
Utilization Review Completed Miryah Ralls J. Cherri Yera, RN, BSN, NCM [phone]  

## 2012-12-30 NOTE — Progress Notes (Signed)
ANTICOAGULATION CONSULT NOTE - Follow-up  Pharmacy Consult for Coumadin Indication: atrial fibrillation  Allergies  Allergen Reactions  . Ace Inhibitors     Cough  . Cimetidine      unknown reaction, pt states per Dr. Jarold Motto  . Ciprofloxacin Other (See Comments)    unknown  . Nsaids     Dr does not want patient to take due to kidneys  . Zantac (Ranitidine Hcl)     Hepatitis per Dr. Jarold Motto    Patient Measurements: Height: 5\' 11"  (180.3 cm) Weight: 165 lb 9.6 oz (75.116 kg) (a scale) IBW/kg (Calculated) : 75.3  Vital Signs: Temp: 97.3 F (36.3 C) (05/05 0443) Temp src: Oral (05/05 0443) BP: 112/60 mmHg (05/05 0921) Pulse Rate: 72 (05/05 0921)  Labs:  Recent Labs  12/29/12 1015 12/29/12 1645 12/29/12 2255 12/30/12 0430  HGB 12.6*  --   --  12.6*  HCT 37.3*  --   --  37.5*  PLT 136*  --   --  140*  APTT  --  43*  --   --   LABPROT 29.4*  --   --  27.9*  INR 2.98*  --   --  2.77*  CREATININE 1.56*  --   --  1.64*  TROPONINI <0.30 <0.30 <0.30 <0.30    Estimated Creatinine Clearance: 35.6 ml/min (by C-G formula based on Cr of 1.64).  Assessment: SOB 79 y/o M with significant PMH including known CAD, Afib, ICM/CHF (EF 20-25%), AAA presented with CHF exac, possible COPD exac, and gout. He continues on his chronic coumadin for afib. INR remainst at goal at 2.77 today on his home regimen of 2.5mg  daily.   Goal of Therapy:  INR 2-3   Plan:  1. Continue coumadin 2.5mg  PO daily for now 2. F/u AM INR  Lysle Pearl, PharmD, BCPS Pager # [phone] 12/30/2012 10:09 AM

## 2012-12-30 NOTE — Progress Notes (Signed)
[facility]  Subjective: Breathing improved after first dose of IV Lasix, then got worse after 2 dose. After that point, pt was placed on supplemental O2 and breathing is now better.   Objective: Vital signs in last 24 hours: Temp:  [97.2 F (36.2 C)-98 F (36.7 C)] 97.3 F (36.3 C) (05/05 0443) Pulse Rate:  [72-96] 72 (05/05 0921) Resp:  [15-31] 18 (05/05 0921) BP: (112-149)/(60-90) 112/60 mmHg (05/05 0921) SpO2:  [90 %-100 %] 100 % (05/05 0921) Weight:  [165 lb 9.6 oz (75.116 kg)-168 lb 6.9 oz (76.4 kg)] 165 lb 9.6 oz (75.116 kg) (05/05 0443) Last BM Date: 12/29/12  Intake/Output from previous day: 05/04 0701 - 05/05 0700 In: 360 [P.O.:360] Out: 1550 [Urine:1550] Intake/Output this shift: Total I/O In: 240 [P.O.:240] Out: 200 [Urine:200]  Medications Current Facility-Administered Medications  Medication Dose Route Frequency Provider Last Rate Last Dose  . 0.9 %  sodium chloride infusion  250 mL Intravenous PRN Nada Boozer, NP      . acetaminophen (TYLENOL) tablet 650 mg  650 mg Oral Q4H PRN Nada Boozer, NP      . allopurinol (ZYLOPRIM) tablet 300 mg  300 mg Oral Daily Nada Boozer, NP   300 mg at 12/30/12 0929  . aspirin chewable tablet 324 mg  324 mg Oral NOW Nada Boozer, NP       Or  . aspirin suppository 300 mg  300 mg Rectal NOW Nada Boozer, NP      . aspirin EC tablet 81 mg  81 mg Oral Daily Nada Boozer, NP   81 mg at 12/30/12 1610  . atorvastatin (LIPITOR) tablet 80 mg  80 mg Oral q1800 Nada Boozer, NP   80 mg at 12/29/12 2147  . carvedilol (COREG) tablet 12.5 mg  12.5 mg Oral BID WC Nada Boozer, NP   12.5 mg at 12/30/12 9604  . cholecalciferol (VITAMIN D) tablet 1,000 Units  1,000 Units Oral Daily Nada Boozer, NP   1,000 Units at 12/30/12 [phone]  . cilostazol (PLETAL) tablet 50 mg  50 mg Oral BID Nada Boozer, NP   50 mg at 12/30/12 8119  . colchicine tablet 0.6 mg  0.6 mg Oral Daily Nada Boozer, NP   0.6 mg at 12/30/12 1478  .  furosemide (LASIX) injection 80 mg  80 mg Intravenous Q12H Nada Boozer, NP   80 mg at 12/30/12 2956  . LORazepam (ATIVAN) tablet 0.5 mg  0.5 mg Oral QHS PRN Nada Boozer, NP      . multivitamin with minerals tablet 1 tablet  1 tablet Oral Daily Chrystie Nose, MD   1 tablet at 12/30/12 2130  . nitroGLYCERIN (NITROSTAT) SL tablet 0.4 mg  0.4 mg Sublingual Q5 Min x 3 PRN Nada Boozer, NP      . ondansetron [facility]) injection 4 mg  4 mg Intravenous Q6H PRN Nada Boozer, NP      . potassium chloride SA (K-DUR,KLOR-CON) CR tablet 20 mEq  20 mEq Oral BID Nada Boozer, NP   20 mEq at 12/30/12 0929  . sodium chloride 0.9 % injection 3 mL  3 mL Intravenous Q12H Nada Boozer, NP   3 mL at 12/30/12 0932  . sodium chloride 0.9 % injection 3 mL  3 mL Intravenous PRN Nada Boozer, NP      . terazosin (HYTRIN) capsule 5 mg  5 mg Oral QHS Nada Boozer, NP   5 mg at 12/29/12 2147  . warfarin (COUMADIN) tablet 2.5 mg  2.5 mg Oral q1800 Nada Boozer, NP   2.5 mg at 12/29/12 1804    PE: General appearance: alert, cooperative and no distress Lungs: clear to auscultation bilaterally Heart: regular rate and rhythm Extremities: no LEE Pulses: 2+ and symmetric Skin: warm and dry Neurologic: Grossly normal  Lab Results:   Recent Labs  12/29/12 1015 12/30/12 0430  WBC 8.7 7.3  HGB 12.6* 12.6*  HCT 37.3* 37.5*  PLT 136* 140*   BMET  Recent Labs  12/29/12 1015 12/30/12 0430  NA 140 142  K 3.5 3.9  CL 103 103  CO2 27 30  GLUCOSE 136* 123*  BUN 28* 32*  CREATININE 1.56* 1.64*  CALCIUM 9.1 9.3   PT/INR  Recent Labs  12/29/12 1015 12/30/12 0430  LABPROT 29.4* 27.9*  INR 2.98* 2.77*   Cholesterol  Recent Labs  12/30/12 0430  CHOL 141   Cardiac Panel (last 3 results)  Recent Labs  12/29/12 1645 12/29/12 2255 12/30/12 0430  TROPONINI <0.30 <0.30 <0.30   Stuardies/Results: BNP (last 3 results)  Recent Labs  11/05/12 1122 12/29/12 1015 12/30/12 0430  PROBNP 6846.0*  7375.0* 11565.0*    Assessment/Plan  Principal Problem:   Acute on chronic combined systolic and diastolic CHF, NYHA class 3 Active Problems:   HYPERLIPIDEMIA   Gout    CABG 1999, Cath Fen 2013 with patent grafts and distal disease -refused life vest and ICD   Atrial fibrillation, chronic- permanent   Chronic anticoagulation, secondary to Chronic a. fib.   Ischemic cardiomyopathy, EF 20-25% by 2D Feb 2012   COPD (chronic obstructive pulmonary disease)   AAA (abdominal aortic aneurysm) 5.6cm by CT 7/12  Plan: Breathing worsened last PM, but is now better with supplemental O2. Pro BNP is elevated significantly higher than yesterday's value. BNP today is 11,565 compared to 7,375 yesterday, despite diuresis. He is on 80 mg of IV Lasix BID. Net diuresis since admission yesterday is ~1.2 L.  SCr is 1.64  K+ is WNL at 3.9.  Continue with BID IV Lasix today, but continue to monitor renal function closely.     LOS: 1 day    Brittainy M. Delmer Islam 12/30/2012 9:50 AM   Patient seen and examined. Agree with assessment and plan.  Breathing better with diuresis and supplemental oxygen.  I/O -1150 since admission. Continue IV lasix today. Consider nitrates/hydralazine with BNP elevation, ischemic cardiomyopathy and renal insufficiency.   Lennette Bihari, MD, [facility] 12/30/2012 10:29 AM

## 2012-12-31 ENCOUNTER — Ambulatory Visit (INDEPENDENT_AMBULATORY_CARE_PROVIDER_SITE_OTHER): Payer: Medicare Other | Admitting: Pharmacist

## 2012-12-31 ENCOUNTER — Telehealth: Payer: Self-pay | Admitting: Pharmacist

## 2012-12-31 LAB — BASIC METABOLIC PANEL
Chloride: 99 mEq/L (ref 96–112)
GFR calc Af Amer: 44 mL/min — ABNORMAL LOW (ref >90)
Potassium: 3.8 mEq/L (ref 3.5–5.1)

## 2012-12-31 LAB — TROPONIN I: Troponin I: <0.3 ng/mL (ref <0.30)

## 2012-12-31 LAB — POCT INR: INR: 2.8

## 2012-12-31 LAB — PRO B NATRIURETIC PEPTIDE: Pro B Natriuretic peptide (BNP): 6280 pg/mL — ABNORMAL HIGH (ref 0–450)

## 2012-12-31 LAB — PROTIME-INR: Prothrombin Time: 28.1 seconds — ABNORMAL HIGH (ref 11.6–15.2)

## 2012-12-31 MED ORDER — FUROSEMIDE 40 MG PO TABS
60.0000 mg | ORAL_TABLET | Freq: Two times a day (BID) | ORAL | Status: DC
  Administered 2012-12-31 – 2013-01-01 (×2): 60 mg via ORAL
  Filled 2012-12-31 (×4): qty 1

## 2012-12-31 MED ORDER — FUROSEMIDE 40 MG PO TABS
40.0000 mg | ORAL_TABLET | Freq: Two times a day (BID) | ORAL | Status: DC
  Filled 2012-12-31 (×2): qty 1

## 2012-12-31 MED ORDER — WARFARIN - PHARMACIST DOSING INPATIENT
Freq: Every day | Status: DC
  Administered 2012-12-31: 17:00:00

## 2012-12-31 NOTE — Progress Notes (Signed)
[name] [name] [name] was recently followed by [facility] Care Management, however he was discharged to [facility] SNF in the recent past. Therefore, [facility] Care Management was not active. Consents were signed in the past and spoke with patient and family at bedside today to see if he would be interested in [facility] Care Management following when he discharges. Patient agreeable to [facility] Care Management following. He lives alone but has supportive daughter, [name] [name] who lives nearby. Her number is [phone]. [name] [name] could benefit from home health services if he would agree. Family at bedside report he declined home health services in the past. Will update inpatient RNCM. Left [facility] Care Management brochure and contact number with patient.   Raiford Noble, MSN-Ed, RN,BSN- [facility] [phone]

## 2012-12-31 NOTE — Progress Notes (Signed)
SATURATION QUALIFICATIONS: (This note is used to comply with regulatory documentation for home oxygen) ? ?Patient Saturations on Room Air at Rest = 97% ? ?Patient Saturations on Room Air while Ambulating = 98% ? ? ?

## 2012-12-31 NOTE — Progress Notes (Signed)
ANTICOAGULATION CONSULT NOTE - Follow-up  Pharmacy Consult for Coumadin Indication: atrial fibrillation  Allergies  Allergen Reactions  . Ace Inhibitors     Cough  . Cimetidine      unknown reaction, pt states per Dr. Jarold Motto  . Ciprofloxacin Other (See Comments)    unknown  . Nsaids     Dr does not want patient to take due to kidneys  . Zantac (Ranitidine Hcl)     Hepatitis per Dr. Jarold Motto    Patient Measurements: Height: 5\' 11"  (180.3 cm) Weight: 164 lb 10.9 oz (74.7 kg) (Scale A) IBW/kg (Calculated) : 75.3  Vital Signs: Temp: 97.4 F (36.3 C) (05/06 0552) Temp src: Oral (05/06 0552) BP: 99/56 mmHg (05/06 0552) Pulse Rate: 92 (05/06 0552)  Labs:  Recent Labs  12/29/12 1015 12/29/12 1645  12/30/12 0430  12/30/12 1556 12/30/12 2205 12/31/12 0321  HGB 12.6*  --   --  12.6*  --   --   --   --   HCT 37.3*  --   --  37.5*  --   --   --   --   PLT 136*  --   --  140*  --   --   --   --   APTT  --  43*  --   --   --   --   --   --   LABPROT 29.4*  --   --  27.9*  --   --   --  28.1*  INR 2.98*  --   --  2.77*  --   --   --  2.80*  CREATININE 1.56*  --   --  1.64*  --   --   --   --   TROPONINI <0.30 <0.30  < > <0.30  < > <0.30 <0.30 <0.30  < > = values in this interval not displayed.  Estimated Creatinine Clearance: 35.4 ml/min (by C-G formula based on Cr of 1.64).  Assessment: SOB 79 y/o M with significant PMH including known CAD, Afib, ICM/CHF (EF 20-25%), AAA presented with CHF exac, possible COPD exac, and gout. He continues on his chronic coumadin for afib. INR remains at goal at 2.8 today on his home regimen of 2.5mg  daily.   Goal of Therapy:  INR 2-3   Plan:  1. Continue coumadin 2.5mg  PO daily for now 2. F/u AM INR  Lysle Pearl, PharmD, BCPS Pager # [phone] 12/31/2012 8:53 AM

## 2012-12-31 NOTE — Progress Notes (Addendum)
Pt. Seen and examined. Agree with the NP/PA-C note as written.  Breathing is better - suspect mild CHF exacerbation of unknown etiology. Echo looks relatively unchanged, perhaps slightly worse compared to 12/2011 - EF 20-25% - underlying a-fib, elevated LV filling pressures. Improved with diuresis. Agree with re-starting home lasix at higher dose (60 mg BID). Hopeful to dc home tomorrow.  Chrystie Nose, MD, [facility] Attending Cardiologist [facility]

## 2012-12-31 NOTE — Progress Notes (Signed)
  Echocardiogram 2D Echocardiogram has been performed.  [name] [name] 12/31/2012, 9:45 AM

## 2012-12-31 NOTE — Progress Notes (Signed)
[facility]  Subjective: Breathing a lot better off O2.  No orthopnea.  Objective: Vital signs in last 24 hours: Temp:  [97.4 F (36.3 C)-98.6 F (37 C)] 97.4 F (36.3 C) (05/06 0552) Pulse Rate:  [72-92] 92 (05/06 0552) Resp:  [18-20] 20 (05/06 0552) BP: (99-133)/(56-66) 99/56 mmHg (05/06 0552) SpO2:  [96 %-100 %] 96 % (05/06 0552) Weight:  [164 lb 10.9 oz (74.7 kg)] 164 lb 10.9 oz (74.7 kg) (05/06 0552) Last BM Date: 12/30/12  Intake/Output from previous day: 05/05 0701 - 05/06 0700 In: 1320 [P.O.:1320] Out: 2025 [Urine:2025] Intake/Output this shift: Total I/O In: 360 [P.O.:360] Out: 400 [Urine:400]  Medications Current Facility-Administered Medications  Medication Dose Route Frequency Provider Last Rate Last Dose  . 0.9 %  sodium chloride infusion  250 mL Intravenous PRN Nada Boozer, NP      . acetaminophen (TYLENOL) tablet 650 mg  650 mg Oral Q4H PRN Nada Boozer, NP      . allopurinol (ZYLOPRIM) tablet 300 mg  300 mg Oral Daily Nada Boozer, NP   300 mg at 12/30/12 0929  . aspirin EC tablet 81 mg  81 mg Oral Daily Nada Boozer, NP   81 mg at 12/30/12 1610  . atorvastatin (LIPITOR) tablet 80 mg  80 mg Oral q1800 Nada Boozer, NP   80 mg at 12/30/12 1728  . carvedilol (COREG) tablet 12.5 mg  12.5 mg Oral BID WC Nada Boozer, NP   12.5 mg at 12/31/12 9604  . cholecalciferol (VITAMIN D) tablet 1,000 Units  1,000 Units Oral Daily Nada Boozer, NP   1,000 Units at 12/30/12 [phone]  . cilostazol (PLETAL) tablet 50 mg  50 mg Oral BID Nada Boozer, NP   50 mg at 12/30/12 2238  . colchicine tablet 0.6 mg  0.6 mg Oral Daily Nada Boozer, NP   0.6 mg at 12/30/12 8119  . feeding supplement (GLUCERNA SHAKE) liquid 237 mL  237 mL Oral Q24H Tonye Becket, RD   237 mL at 12/30/12 1637  . furosemide (LASIX) injection 80 mg  80 mg Intravenous Q12H Nada Boozer, NP   80 mg at 12/31/12 0604  . hydrALAZINE (APRESOLINE) tablet 12.5 mg  12.5 mg Oral Q8H  Lennette Bihari, MD   12.5 mg at 12/31/12 0604  . isosorbide mononitrate (IMDUR) 24 hr tablet 15 mg  15 mg Oral Daily Lennette Bihari, MD   15 mg at 12/30/12 1632  . LORazepam (ATIVAN) tablet 0.5 mg  0.5 mg Oral QHS PRN Nada Boozer, NP      . multivitamin with minerals tablet 1 tablet  1 tablet Oral Daily Chrystie Nose, MD   1 tablet at 12/30/12 1478  . nitroGLYCERIN (NITROSTAT) SL tablet 0.4 mg  0.4 mg Sublingual Q5 Min x 3 PRN Nada Boozer, NP      . ondansetron [facility]) injection 4 mg  4 mg Intravenous Q6H PRN Nada Boozer, NP      . potassium chloride SA (K-DUR,KLOR-CON) CR tablet 20 mEq  20 mEq Oral BID Nada Boozer, NP   20 mEq at 12/30/12 2238  . sodium chloride 0.9 % injection 3 mL  3 mL Intravenous Q12H Nada Boozer, NP   3 mL at 12/30/12 2243  . sodium chloride 0.9 % injection 3 mL  3 mL Intravenous PRN Nada Boozer, NP      . terazosin (HYTRIN) capsule 5 mg  5 mg Oral QHS Nada Boozer, NP   5 mg  at 12/30/12 2239  . warfarin (COUMADIN) tablet 2.5 mg  2.5 mg Oral q1800 Nada Boozer, NP   2.5 mg at 12/30/12 1728  . Warfarin - Pharmacist Dosing Inpatient   Does not apply q1800 Drake Leach Rumbarger, [facility]        PE: General appearance: alert, cooperative and no distress Lungs: clear to auscultation bilaterally Heart: irregularly irregular rhythm Abdomen: Abd soft and nontender.  no distention  Extremities: No LEE Pulses: 2+ and symmetric Neurologic: Grossly normal  Lab Results:   Recent Labs  12/29/12 1015 12/30/12 0430  WBC 8.7 7.3  HGB 12.6* 12.6*  HCT 37.3* 37.5*  PLT 136* 140*   BMET  Recent Labs  12/29/12 1015 12/30/12 0430  NA 140 142  K 3.5 3.9  CL 103 103  CO2 27 30  GLUCOSE 136* 123*  BUN 28* 32*  CREATININE 1.56* 1.64*  CALCIUM 9.1 9.3   PT/INR  Recent Labs  12/29/12 1015 12/30/12 0430 12/31/12 0321  LABPROT 29.4* 27.9* 28.1*  INR 2.98* 2.77* 2.80*   Cholesterol  Recent Labs  12/30/12 0430  CHOL 141   Lipid Panel     Component Value  Date/Time   CHOL 141 12/30/2012 0430   TRIG 64 12/30/2012 0430   HDL 53 12/30/2012 0430   CHOLHDL 2.7 12/30/2012 0430   VLDL 13 12/30/2012 0430   LDLCALC 75 12/30/2012 0430    Assessment/Plan  Principal Problem:   Acute on chronic combined systolic and diastolic CHF, NYHA class 3 Active Problems:   HYPERLIPIDEMIA   Gout    CABG 1999, Cath Fen 2013 with patent grafts and distal disease -refused life vest and ICD   Atrial fibrillation, chronic- permanent   Chronic anticoagulation, secondary to Chronic a. fib.   Ischemic cardiomyopathy, EF 20-25% by 2D Feb 2012   COPD (chronic obstructive pulmonary disease)   AAA (abdominal aortic aneurysm) 5.6cm by CT 7/12  Plan:   Net fluids: > -0.7L(24hrs): > -1.9L(adm).  SP echocardiogram today.  Cardiologist to read.  He appears euvolemic.  BP stable.  BNP and BMET pending today.  Ambulate in hall with asst today with O2 sat.  Change to PO lasix 40mg  daily.   Chronic afib-INR therapeutic.  Possible DC home tomorrow.   LOS: 2 days    Chrsitopher Wik 12/31/2012 9:20 AM

## 2012-12-31 NOTE — Progress Notes (Signed)
Went to see if pt was ready to ambulate to check O2 sat and pt states, "i feel a little short of breath"  O2 sat on RA at this time is 97%.  Breath sounds are clear bilaterally.  RR wnl.  Unlabored respirations noted.  Applied O2 for pt comfort.  Will attempt to ambulate patient later when he is back to his baseline.

## 2013-01-01 ENCOUNTER — Ambulatory Visit: Payer: Medicare Other | Admitting: Neurosurgery

## 2013-01-01 LAB — TROPONIN I
Troponin I: <0.3 ng/mL (ref <0.30)
Troponin I: <0.3 ng/mL (ref <0.30)

## 2013-01-01 LAB — PROTIME-INR
INR: 2.46 — ABNORMAL HIGH (ref 0.00–1.49)
Prothrombin Time: 25.5 seconds — ABNORMAL HIGH (ref 11.6–15.2)

## 2013-01-01 LAB — BASIC METABOLIC PANEL
GFR calc Af Amer: 41 mL/min — ABNORMAL LOW (ref >90)
GFR calc non Af Amer: 35 mL/min — ABNORMAL LOW (ref >90)
Potassium: 3.5 mEq/L (ref 3.5–5.1)
Sodium: 137 mEq/L (ref 135–145)

## 2013-01-01 MED ORDER — NITROGLYCERIN 0.4 MG SL SUBL: 0.4000 mg | SUBLINGUAL_TABLET | SUBLINGUAL | Status: AC | PRN

## 2013-01-01 MED ORDER — CARVEDILOL 12.5 MG PO TABS: 12.5000 mg | ORAL_TABLET | Freq: Two times a day (BID) | ORAL | Status: DC

## 2013-01-01 MED ORDER — ASPIRIN 81 MG PO TBEC: 81.0000 mg | DELAYED_RELEASE_TABLET | Freq: Every day | ORAL | Status: DC

## 2013-01-01 MED ORDER — HYDRALAZINE HCL 25 MG PO TABS: 12.5000 mg | ORAL_TABLET | Freq: Three times a day (TID) | ORAL | Status: DC

## 2013-01-01 MED ORDER — ISOSORBIDE MONONITRATE 15 MG HALF TABLET: 15.0000 mg | ORAL_TABLET | Freq: Every day | ORAL | Status: DC

## 2013-01-01 MED ORDER — FUROSEMIDE 20 MG PO TABS: 60.0000 mg | ORAL_TABLET | Freq: Two times a day (BID) | ORAL | Status: DC

## 2013-01-01 NOTE — Discharge Summary (Signed)
Physician Discharge Summary  Patient ID: CASMERE [name] [name] MRN: [id number] DOB/AGE: Sep 18, 1925 79 y.o.  Admit date: 12/29/2012 Discharge date: 01/01/2013  Admission Diagnoses: Acute on Chronic Combined Systolic and Diastolic CHF, NYHA class 3  Discharge Diagnoses:  Principal Problem:   Acute on chronic combined systolic and diastolic CHF, NYHA class 3 Active Problems:   HYPERLIPIDEMIA   Gout    CABG 1999, Cath Fen 2013 with patent grafts and distal disease -refused life vest and ICD   Atrial fibrillation, chronic- permanent   Chronic anticoagulation, secondary to Chronic a. fib.   Ischemic cardiomyopathy, EF 20-25% by 2D Feb 2012   COPD (chronic obstructive pulmonary disease)   AAA (abdominal aortic aneurysm) 5.6cm by CT 7/12   Stage 3 chronic renal impairment associated with type 2 diabetes mellitus   DM (diabetes mellitus)   Discharged Condition: stable  Hospital Course: The patient is an 79 y.o. Caucasian male with relatively complex medical history. His primary cardiologist is Dr. [name]. He has known CAD, s/p CABG in 1999. His last cath was 10/02/11, which revealed an EF of 15-20%, moderate pulmonary hypertension with PA pressure of 52 mm. He also has an ascending aortic aneurysm with 2+ aortic insufficiency. He presented to the [facility] ER on 12/29/12 with complaints of increasing shortness of breath and a non-productive cough. Work up revealed acute on chronic, combined systolic and diastolic CHF, with an elevated BNP at 7,375 as well as pulmonary vascular congestion on CXR. He was admitted and treated with IV Lasix for  diuresis. A combination of a nitrate and hydralazine was also added. He had good diuresis and his symptoms resolved. He underwent a 2D echo, which was relatively unchanged from prior study. He was transitioned back to PO Lasix at a higher home dose of 60 mg BID. On hospital day 3, he was seen and examined by Dr. Allyson Sabal. The patient was euvolemic and he was free of symptoms.  Dr. Allyson Sabal determined that he was stable for discharge home. He is scheduled to follow up with Dr. [name] at [facility] on 01/17/13.    Consults: None  Significant Diagnostic Studies: None Treatments: See Hospital Course  Discharge Exam: Blood pressure 125/63, pulse 77, temperature 97.8 F (36.6 C), temperature source Oral, resp. rate 20, height 5\' 11"  (1.803 m), weight 163 lb 4.8 oz (74.072 kg), SpO2 100.00%.   Disposition: 01-Home or Self Care      Discharge Orders   Future Appointments Provider Department Dept Phone   04/17/2013 12:00 PM Delight Ovens, MD [facility] [phone]   Future Orders Complete By Expires     Diet - low sodium heart healthy  As directed     Increase activity slowly  As directed         Medication List    TAKE these medications       allopurinol 300 MG tablet  Commonly known as:  ZYLOPRIM  Take 300 mg by mouth daily.     aspirin 81 MG EC tablet  Take 1 tablet (81 mg total) by mouth daily.     atorvastatin 80 MG tablet  Commonly known as:  LIPITOR  Take 80 mg by mouth daily.     carvedilol 12.5 MG tablet  Commonly known as:  COREG  Take 1 tablet (12.5 mg total) by mouth 2 (two) times daily with a meal.     CENTRUM SILVER ADULT 50+ Tabs  Take 1 tablet by mouth daily.  cholecalciferol 1000 UNITS tablet  Commonly known as:  VITAMIN D  Take 1,000 Units by mouth daily.     cilostazol 50 MG tablet  Commonly known as:  PLETAL  Take 50 mg by mouth 2 (two) times daily.     COLCRYS 0.6 MG tablet  Generic drug:  colchicine  Take 0.6 mg by mouth 2 (two) times daily as needed (for acute gout flare).     furosemide 20 MG tablet  Commonly known as:  LASIX  Take 3 tablets (60 mg total) by mouth 2 (two) times daily.     hydrALAZINE 25 MG tablet  Commonly known as:  APRESOLINE  Take 0.5 tablets (12.5 mg total) by mouth every 8 (eight) hours.     isosorbide mononitrate 15 mg Tb24  Commonly known  as:  IMDUR  Take 0.5 tablets (15 mg total) by mouth daily.     LORazepam 0.5 MG tablet  Commonly known as:  ATIVAN  Take 1 tablet (0.5 mg total) by mouth at bedtime as needed for anxiety (to help sleep). For anxiety     nitroGLYCERIN 0.4 MG SL tablet  Commonly known as:  NITROSTAT  Place 1 tablet (0.4 mg total) under the tongue every 5 (five) minutes x 3 doses as needed for chest pain.     potassium chloride SA 20 MEQ tablet  Commonly known as:  K-DUR,KLOR-CON  Take 20 mEq by mouth 2 (two) times daily.     terazosin 5 MG capsule  Commonly known as:  HYTRIN  Take 5 mg by mouth at bedtime.     warfarin 5 MG tablet  Commonly known as:  COUMADIN  Take 2.5 mg by mouth daily.       Follow-up Information   Follow up with Governor Rooks, MD On 01/17/2013. (2:30 pm)    Contact information:   [address] [place] [address] [phone]      TIME SPENT ON DISCHARGE, INCLUDING PHYSICIAN TIME: >30 MINUTES  Signed: Allayne Butcher, PA-C 01/06/2013, 11:56 AM

## 2013-01-01 NOTE — Progress Notes (Signed)
ANTICOAGULATION CONSULT NOTE - Follow-up  Pharmacy Consult for Coumadin Indication: atrial fibrillation  Allergies  Allergen Reactions  . Ace Inhibitors     Cough  . Cimetidine      unknown reaction, pt states per Dr. Jarold Motto  . Ciprofloxacin Other (See Comments)    unknown  . Nsaids     Dr does not want patient to take due to kidneys  . Zantac (Ranitidine Hcl)     Hepatitis per Dr. Jarold Motto    Patient Measurements: Height: 5\' 11"  (180.3 cm) Weight: 163 lb 4.8 oz (74.072 kg) (a scale) IBW/kg (Calculated) : 75.3  Vital Signs: Temp: 97.9 F (36.6 C) (05/07 0539) Temp src: Oral (05/07 0539) BP: 121/60 mmHg (05/07 0539) Pulse Rate: 82 (05/07 0539)  Labs:  Recent Labs  12/29/12 1015 12/29/12 1645  12/30/12 0430  12/31/12 12/31/12 0321 12/31/12 1015 12/31/12 1510 12/31/12 2131 01/01/13 0600  HGB 12.6*  --   --  12.6*  --   --   --   --   --   --   --   HCT 37.3*  --   --  37.5*  --   --   --   --   --   --   --   PLT 136*  --   --  140*  --   --   --   --   --   --   --   APTT  --  43*  --   --   --   --   --   --   --   --   --   LABPROT 29.4*  --   --  27.9*  --   --  28.1*  --   --   --  25.5*  INR 2.98*  --   --  2.77*  --  2.8 2.80*  --   --   --  2.46*  CREATININE 1.56*  --   --  1.64*  --   --   --  1.60*  --   --  1.70*  TROPONINI <0.30 <0.30  < > <0.30  < >  --  <0.30 <0.30 <0.30 <0.30 <0.30  < > = values in this interval not displayed.  Estimated Creatinine Clearance: 33.9 ml/min (by C-G formula based on Cr of 1.7).  Assessment: SOB 79 y/o M with significant PMH including known CAD, Afib, ICM/CHF (EF 20-25%), AAA presented with CHF exac, possible COPD exac, and gout. He continues on his chronic coumadin for afib. INR remains at goal at 2.46 today on his home regimen of 2.5mg  daily.   Goal of Therapy:  INR 2-3   Plan:  1. Continue coumadin 2.5mg  PO daily for now 2. Change INR checks to MWF only since INR has been stable  Lysle Pearl, PharmD,  BCPS Pager # [phone] 01/01/2013 9:05 AM

## 2013-01-01 NOTE — Progress Notes (Signed)
[facility]  Subjective: Breathing much better. No supplemental O2 requirements in the last 12 hrs. No orthopnea. He was a bit confused last PM, but otherwise no complaints.   Objective: Vital signs in last 24 hours: Temp:  [97.5 F (36.4 C)-97.9 F (36.6 C)] 97.9 F (36.6 C) (05/07 0539) Pulse Rate:  [76-82] 82 (05/07 0539) Resp:  [18-20] 20 (05/07 0539) BP: (105-121)/(58-66) 121/60 mmHg (05/07 0539) SpO2:  [93 %-98 %] 96 % (05/07 0539) Weight:  [163 lb 4.8 oz (74.072 kg)] 163 lb 4.8 oz (74.072 kg) (05/07 0539) Last BM Date: 12/31/12  Intake/Output from previous day: 05/06 0701 - 05/07 0700 In: 1522 [P.O.:1522] Out: 1835 [Urine:1835] Intake/Output this shift: Total I/O In: 240 [P.O.:240] Out: -   Medications Current Facility-Administered Medications  Medication Dose Route Frequency Provider Last Rate Last Dose  . 0.9 %  sodium chloride infusion  250 mL Intravenous PRN Nada Boozer, NP      . acetaminophen (TYLENOL) tablet 650 mg  650 mg Oral Q4H PRN Nada Boozer, NP      . allopurinol (ZYLOPRIM) tablet 300 mg  300 mg Oral Daily Nada Boozer, NP   300 mg at 01/01/13 0950  . aspirin EC tablet 81 mg  81 mg Oral Daily Nada Boozer, NP   81 mg at 01/01/13 0951  . atorvastatin (LIPITOR) tablet 80 mg  80 mg Oral q1800 Nada Boozer, NP   80 mg at 12/31/12 1728  . carvedilol (COREG) tablet 12.5 mg  12.5 mg Oral BID WC Nada Boozer, NP   12.5 mg at 01/01/13 0640  . cholecalciferol (VITAMIN D) tablet 1,000 Units  1,000 Units Oral Daily Nada Boozer, NP   1,000 Units at 01/01/13 0951  . cilostazol (PLETAL) tablet 50 mg  50 mg Oral BID Nada Boozer, NP   50 mg at 01/01/13 8469  . colchicine tablet 0.6 mg  0.6 mg Oral Daily Nada Boozer, NP   0.6 mg at 01/01/13 0951  . feeding supplement (GLUCERNA SHAKE) liquid 237 mL  237 mL Oral Q24H Tonye Becket, RD   237 mL at 12/31/12 1727  . furosemide (LASIX) tablet 60 mg  60 mg Oral BID Chrystie Nose, MD   60  mg at 01/01/13 0951  . hydrALAZINE (APRESOLINE) tablet 12.5 mg  12.5 mg Oral Q8H Lennette Bihari, MD   12.5 mg at 01/01/13 0640  . isosorbide mononitrate (IMDUR) 24 hr tablet 15 mg  15 mg Oral Daily Lennette Bihari, MD   15 mg at 01/01/13 0953  . LORazepam (ATIVAN) tablet 0.5 mg  0.5 mg Oral QHS PRN Nada Boozer, NP      . multivitamin with minerals tablet 1 tablet  1 tablet Oral Daily Chrystie Nose, MD   1 tablet at 01/01/13 0951  . nitroGLYCERIN (NITROSTAT) SL tablet 0.4 mg  0.4 mg Sublingual Q5 Min x 3 PRN Nada Boozer, NP      . ondansetron [facility]) injection 4 mg  4 mg Intravenous Q6H PRN Nada Boozer, NP      . potassium chloride SA (K-DUR,KLOR-CON) CR tablet 20 mEq  20 mEq Oral BID Nada Boozer, NP   20 mEq at 01/01/13 0951  . sodium chloride 0.9 % injection 3 mL  3 mL Intravenous Q12H Nada Boozer, NP   3 mL at 01/01/13 0953  . sodium chloride 0.9 % injection 3 mL  3 mL Intravenous PRN Nada Boozer, NP      .  terazosin (HYTRIN) capsule 5 mg  5 mg Oral QHS Nada Boozer, NP   5 mg at 12/31/12 2144  . warfarin (COUMADIN) tablet 2.5 mg  2.5 mg Oral q1800 Nada Boozer, NP   2.5 mg at 12/31/12 1728  . Warfarin - Pharmacist Dosing Inpatient   Does not apply q1800 Drake Leach [place], [facility]        PE: General appearance: alert, cooperative and no distress Neck: no JVD Lungs: clear to auscultation bilaterally Heart: irregularly irregular rhythm Extremities: no LEE Pulses: 2+ and symmetric Skin: warm and dry Neurologic: Grossly normal  Lab Results:   Recent Labs  12/30/12 0430  WBC 7.3  HGB 12.6*  HCT 37.5*  PLT 140*   BMET  Recent Labs  12/30/12 0430 12/31/12 1015 01/01/13 0600  NA 142 139 137  K 3.9 3.8 3.5  CL 103 99 99  CO2 30 31 27   GLUCOSE 123* 160* 128*  BUN 32* 38* 40*  CREATININE 1.64* 1.60* 1.70*  CALCIUM 9.3 9.4 8.9   PT/INR  Recent Labs  12/30/12 0430 12/31/12 12/31/12 0321 01/01/13 0600  LABPROT 27.9*  --  28.1* 25.5*  INR 2.77* 2.8 2.80* 2.46*    Cholesterol  Recent Labs  12/30/12 0430  CHOL 141   BNP (last 3 results)  Recent Labs  12/29/12 1015 12/30/12 0430 12/31/12 1015  PROBNP 7375.0* 11565.0* 6280.0*   Studies/Results: 2D echo 12/31/12  Study Conclusions  - Left ventricle: The cavity size was normal. There was moderate concentric hypertrophy. Systolic function was severely reduced. The estimated ejection fraction was in the range of 20% to 25%. The study is not technically sufficient to allow evaluation of LV diastolic function. The E/e' ratio is >10, suggesting elevated LV filling pressure. - Aortic valve: Mild central regurgitation, posteriorly directed jet. - Mitral valve: Moderately calcified posterior annulus. Restricted anterior mitral leaflet motion due to AI. Trace to mild MR. - Left atrium: The atrium was normal in size (23 cm2). - Right ventricle: The cavity size was normal. Systolic function is reduced (TAPSE 14) - Atrial septum: No defect or patent foramen ovale was identified. - Tricuspid valve: Mild leaflet prolapse. Mild regurgitation. - Pulmonic valve: Structurally normal valve. Mild regurgitation. - Pulmonary arteries: PA peak pressure: 32mm Hg (S). - Inferior vena cava: The vessel was normal in size; the respirophasic diameter changes were in the normal range (= 50%); findings are consistent with normal central venous pressure.   Assessment/Plan  Principal Problem:   Acute on chronic combined systolic and diastolic CHF, NYHA class 3 Active Problems:   HYPERLIPIDEMIA   Gout    CABG 1999, Cath Fen 2013 with patent grafts and distal disease -refused life vest and ICD   Atrial fibrillation, chronic- permanent   Chronic anticoagulation, secondary to Chronic a. fib.   Ischemic cardiomyopathy, EF 20-25% by 2D Feb 2012   COPD (chronic obstructive pulmonary disease)   AAA (abdominal aortic aneurysm) 5.6cm by CT 7/12  Plan: No further SOB/orthopnea. No current supplemental O2  requirements. He appears euvolemic on exam. BNP remains elevated, but is improved from 11,565 to 6,280. He was transitioned to PO Lasix yesterday at a dose of 60 mg BID. SCr is slightly up from 1 day ago at 1.70 (1.64 yesterday), however ~1.70 appears to be his baseline. K+ is WNL. BP and HR both stable. A-fib on telemetry w/ rates in the 70s. INR is therapeutic at 2.46. Can likely be discharged home today. Will arrange OP f/u with Dr. [name].  LOS: 3 days    Brittainy M. Delmer Islam 01/01/2013 10:44 AM  Agree with note written by Boyce Medici  PAC  Appears euvolemic. Diuresed. No CP/SOB. AFIB with CVR. INR therapeutic. Renal stable. BNP decreasing. Exam benign. Lungs clear, no periperal edema. OK for D/C home. ROV with Dr. [name].   Runell Gess 01/01/2013 2:42 PM

## 2013-01-01 NOTE — Clinical Documentation Improvement (Signed)
[facility] DOCUMENTATION CLARIFICATION QUERY   CLINICAL DOCUMENTATION QUERIES ARE NOT PART OF THE PERMANENT MEDICAL RECORD   Please update your documentation within the medical record to reflect your response to this query.                                                                                         01/01/13    Velisa Regnier, PA-C,  In a better effort to capture your patient's severity of illness, reflect appropriate length of stay and utilization of resources, a review of the patient medical record has revealed the following information:   "Chronic Renal Disease, Stage III" documented on Non-Hospital Problem List in [facility]  GFR 09/2011 through the present (white male) has ranged in the 30's per [facility].   Based on your clinical judgment, please document in the progress notes and discharge summary if a condition below provides greater specificity regarding the patient's renal function:   - CKD Stage I -  GFR > OR = 90   - CKD Stage II - GFR 60-89   - CKD Stage III - GFR 30-59   - CKD Stage IV - GFR 15-29   - CKD Stage V - GFR < 15   - ESRD (End Stage Renal Disease)   - Other Condition   - Unable to Clinically Determine    In responding to this query please exercise your independent judgment.    The fact that a query is asked, does not imply that any particular answer is desired or expected.   Reviewed: additional documentation in the medical record   Thank You,  Jerral Ralph  RN BSN CCDS Certified Clinical Documentation Specialist: Cell   [phone]  Health Information Management [place]  TO RESPOND TO THE THIS QUERY, FOLLOW THE INSTRUCTIONS BELOW:  1. If needed, update documentation for the patient's encounter via the notes activity.  2. Access this query again and click edit on the In [employer].  3. After updating, or not, click F2 to complete all highlighted (required) fields concerning your review. Select "additional documentation in the  medical record" OR "no additional documentation provided".  4. Click Sign note button.  5. The deficiency will fall out of your In Basket *Please let us know if you are not able to complete this workflow by phone or e-mail (listed below).

## 2013-01-06 ENCOUNTER — Encounter: Payer: Self-pay | Admitting: Cardiology

## 2013-01-06 DIAGNOSIS — E1129 Type 2 diabetes mellitus with other diabetic kidney complication: Secondary | ICD-10-CM

## 2013-01-07 NOTE — Telephone Encounter (Signed)
Patient in hospital now and I am unable to get in touch with him

## 2013-01-13 ENCOUNTER — Telehealth: Payer: Self-pay | Admitting: Cardiovascular Disease

## 2013-01-13 NOTE — Telephone Encounter (Signed)
Returned call.  Pt stated when he was discharged he was supposed to take furosemide 60mg  BID, but when his refill request was sent it was for 20mg  BID.  Pt stated he is out of medicine now because he has to take 3 pills twice a day.  Pt informed a new Rx will be sent to pharmacy with updated instructions.  Pt verbalized understanding and agreed w/ plan.  Furosemide 60mg  BID sent through [employer].

## 2013-01-13 NOTE — Telephone Encounter (Signed)
Mix up in his medicine (generic Lasix)!

## 2013-01-21 ENCOUNTER — Other Ambulatory Visit: Payer: Self-pay | Admitting: Nurse Practitioner

## 2013-01-23 NOTE — Telephone Encounter (Signed)
Pt hospitalized this month. Can't tell if pt suppose to be on this medicine. Please review last hospitalization. Thanks.

## 2013-01-29 ENCOUNTER — Other Ambulatory Visit: Payer: Self-pay | Admitting: Pharmacist Clinician (PhC)/ Clinical Pharmacy Specialist

## 2013-01-30 ENCOUNTER — Other Ambulatory Visit: Payer: Self-pay

## 2013-01-30 MED ORDER — CILOSTAZOL 50 MG PO TABS: 50.0000 mg | ORAL_TABLET | Freq: Two times a day (BID) | ORAL | Status: DC

## 2013-01-30 NOTE — Telephone Encounter (Signed)
Rx was sent to pharmacy electronically via [employer].  

## 2013-02-03 ENCOUNTER — Telehealth: Payer: Self-pay | Admitting: Pharmacist

## 2013-02-03 NOTE — Telephone Encounter (Signed)
Called to reschedule protime appt.  LM on patient's VM

## 2013-02-11 ENCOUNTER — Ambulatory Visit (INDEPENDENT_AMBULATORY_CARE_PROVIDER_SITE_OTHER): Payer: Medicare Other | Admitting: Pharmacist

## 2013-02-11 DIAGNOSIS — I4891 Unspecified atrial fibrillation: Secondary | ICD-10-CM

## 2013-02-11 LAB — POCT INR: INR: 1.8

## 2013-02-11 NOTE — Patient Instructions (Signed)
Anticoagulation Dose Instructions as of 02/11/2013     [name] [name] Tue Wed Thu Fri Sat   New Dose 2.5 mg 2.5 mg 5 mg 2.5 mg 2.5 mg 2.5 mg 2.5 mg    Description       Extra 1/2 tablet today, then start new dose of 1/2 tablet [= 2.5mg ] daily except on tuesdays take 1 tablet [= 5mg ]       INR was 1.8 today.

## 2013-02-25 ENCOUNTER — Telehealth: Payer: Self-pay | Admitting: Family Medicine

## 2013-02-25 NOTE — Telephone Encounter (Signed)
appt given  

## 2013-03-11 ENCOUNTER — Ambulatory Visit (INDEPENDENT_AMBULATORY_CARE_PROVIDER_SITE_OTHER): Payer: Medicare Other | Admitting: Family Medicine

## 2013-03-11 ENCOUNTER — Encounter: Payer: Self-pay | Admitting: Family Medicine

## 2013-03-11 ENCOUNTER — Ambulatory Visit: Payer: Self-pay | Admitting: Pharmacist Clinician (PhC)/ Clinical Pharmacy Specialist

## 2013-03-11 VITALS — BP 122/66 | HR 77 | Temp 96.9°F | Wt 169.0 lb

## 2013-03-11 DIAGNOSIS — I5043 Acute on chronic combined systolic (congestive) and diastolic (congestive) heart failure: Secondary | ICD-10-CM

## 2013-03-11 DIAGNOSIS — I4891 Unspecified atrial fibrillation: Secondary | ICD-10-CM

## 2013-03-11 DIAGNOSIS — E785 Hyperlipidemia, unspecified: Secondary | ICD-10-CM

## 2013-03-11 DIAGNOSIS — I509 Heart failure, unspecified: Secondary | ICD-10-CM

## 2013-03-11 DIAGNOSIS — R5381 Other malaise: Secondary | ICD-10-CM

## 2013-03-11 DIAGNOSIS — R5383 Other fatigue: Secondary | ICD-10-CM

## 2013-03-11 DIAGNOSIS — J449 Chronic obstructive pulmonary disease, unspecified: Secondary | ICD-10-CM

## 2013-03-11 LAB — POCT CBC
Lymph, poc: 2.2 (ref 0.6–3.4)
MCH, POC: 30.9 pg (ref 27–31.2)
MCHC: 34.5 g/dL (ref 31.8–35.4)
MCV: 89.5 fL (ref 80–97)
MPV: 7.5 fL (ref 0–99.8)
POC LYMPH PERCENT: 33.1 %L (ref 10–50)
Platelet Count, POC: 134 10*3/uL — AB (ref 142–424)

## 2013-03-11 LAB — POCT INR
INR: 2.1
INR: 2.1

## 2013-03-11 MED ORDER — LORAZEPAM 0.5 MG PO TABS: 0.5000 mg | ORAL_TABLET | Freq: Every evening | ORAL | Status: DC | PRN

## 2013-03-11 NOTE — Progress Notes (Signed)
  Subjective:    Patient ID: [name] [name], male    DOB: [birth year], 79 y.o.   MRN: [id number]  HPI Patient comes in today for followup of chronic medical problems. These include coronary artery disease and congestive heart failure, hypertension chronic kidney disease and high-risk medication. The patient has had 2 hospitalizations since March for congestive heart failure the most recent time was finished in early May. Shortly after that he lost a son to lung cancer.   Review of Systems  Constitutional: Positive for fatigue.  HENT: Negative for congestion.   Respiratory: Positive for shortness of breath. Negative for cough.   Cardiovascular: Positive for leg swelling (improving). Negative for chest pain.  Genitourinary: Positive for frequency (due to meds). Negative for dysuria.  Musculoskeletal: Positive for back pain (mid R side).  Neurological: Positive for weakness. Negative for dizziness and headaches.  Psychiatric/Behavioral: Positive for sleep disturbance. The patient is nervous/anxious (depression due to loss of son).        Objective:   Physical Exam  Nursing note and vitals reviewed. Constitutional: He is oriented to person, place, and time. No distress.  Somewhat frail and pale today in appearance  Eyes: Conjunctivae are normal. Right eye exhibits no discharge. Left eye exhibits discharge. No scleral icterus.  Neck: Normal range of motion. Neck supple. No thyromegaly present.  Cardiovascular: Normal heart sounds.  Exam reveals no gallop and no friction rub.   No murmur heard. Irregular irregular rate and rhythm at 84 per minute  Pulmonary/Chest: Effort normal and breath sounds normal. No respiratory distress. He has no wheezes. He has no rales.  Abdominal: Soft. Bowel sounds are normal. He exhibits no distension and no mass. There is no tenderness. There is no rebound and no guarding.  Musculoskeletal: He exhibits no edema.  Using a rolling walker  Lymphadenopathy:    He  has no cervical adenopathy.  Neurological: He is alert and oriented to person, place, and time.  Skin: Skin is warm and dry. He is not diaphoretic. There is pallor.  Psychiatric: He has a normal mood and affect. His behavior is normal. Judgment and thought content normal.  Just lost his son with lung cancer          Assessment & Plan:  1. Atrial fibrillation - POCT INR  2. COPD (chronic obstructive pulmonary disease)  3. Chronic renal disease, stage III  4. Acute on chronic combined systolic and diastolic CHF, NYHA class 3 - BASIC METABOLIC PANEL WITH GFR  5. Fatigue - POCT CBC - Vitamin D 25 hydroxy  6. Hyperlipemia - Hepatic function panel - Lipid panel  Patient Instructions  Fall precautions discussed Continue current meds and therapeutic lifestyle changes We will make sure that cardiologist gets a copy of labs Continue to monitor weight   Nyra Capes MD

## 2013-03-11 NOTE — Patient Instructions (Addendum)
Fall precautions discussed Continue current meds and therapeutic lifestyle changes We will make sure that cardiologist gets a copy of labs Continue to monitor weight

## 2013-03-12 ENCOUNTER — Telehealth: Payer: Self-pay | Admitting: *Deleted

## 2013-03-12 LAB — BASIC METABOLIC PANEL WITH GFR
BUN: 27 mg/dL — ABNORMAL HIGH (ref 6–23)
CO2: 30 mEq/L (ref 19–32)
Calcium: 9.3 mg/dL (ref 8.4–10.5)
Creat: 1.71 mg/dL — ABNORMAL HIGH (ref 0.50–1.35)
GFR, Est Non African American: 36 mL/min — ABNORMAL LOW

## 2013-03-12 LAB — HEPATIC FUNCTION PANEL
ALT: 11 U/L (ref 0–53)
Albumin: 4.2 g/dL (ref 3.5–5.2)
Alkaline Phosphatase: 65 U/L (ref 39–117)
Total Protein: 6.8 g/dL (ref 6.0–8.3)

## 2013-03-12 LAB — LIPID PANEL
HDL: 44 mg/dL (ref >39)
LDL Cholesterol: 158 mg/dL — ABNORMAL HIGH (ref 0–99)

## 2013-03-12 LAB — VITAMIN D 25 HYDROXY (VIT D DEFICIENCY, FRACTURES): Vit D, 25-Hydroxy: 36 ng/mL (ref 30–89)

## 2013-03-12 NOTE — Telephone Encounter (Signed)
Message copied by Bearl Mulberry on Wed Mar 12, 2013  6:04 PM ------      Message from: Ernestina Penna      Created: Wed Mar 12, 2013  7:39 AM       Sodium potassium and chloride were all within normal limits. Blood sugar was slightly elevated at 103 but this is okay. The creatinine, a kidney function test, remains elevated at 1.71. This is stable with past readings.      Vitamin D was within normal limits      Liver function tests were good and were within normal limit      : 8 traditional lipid panel the total cholesterol was elevated the triglycerides were good and the LDL cholesterol was elevated at 158. The LDL was much lower in the past. Try to do better with diet and get as much exercise as possible. Continue cholesterol medication .       ------

## 2013-03-12 NOTE — Telephone Encounter (Signed)
Pt notified of results

## 2013-03-14 ENCOUNTER — Telehealth: Payer: Self-pay | Admitting: Critical Care Medicine

## 2013-03-14 NOTE — Telephone Encounter (Signed)
appt set for Wed with VS. Carron Curie, CMA

## 2013-03-14 NOTE — Telephone Encounter (Signed)
Spoke to pt. States that he's having SOB with and without exertion. SOB comes and goes but is worse at night. Pt has been on O2 in the past. Wanting to know if PW will do an ONO. I offered for him to have an appointment and he declined at this time. States that he will come if need be but doesn't feel like his SOB is bad enough for an appointment right now.  PW - please advise. Thanks.

## 2013-03-14 NOTE — Telephone Encounter (Signed)
Needs OV with any provider to sort out

## 2013-03-18 ENCOUNTER — Other Ambulatory Visit: Payer: Self-pay | Admitting: Family Medicine

## 2013-03-19 ENCOUNTER — Telehealth: Payer: Self-pay | Admitting: Family Medicine

## 2013-03-19 ENCOUNTER — Ambulatory Visit (INDEPENDENT_AMBULATORY_CARE_PROVIDER_SITE_OTHER)
Admission: RE | Admit: 2013-03-19 | Discharge: 2013-03-19 | Disposition: A | Discharge status: Home / Self Care | Payer: Medicare Other | Source: Ambulatory Visit | Attending: Pulmonary Disease | Admitting: Pulmonary Disease

## 2013-03-19 ENCOUNTER — Ambulatory Visit (INDEPENDENT_AMBULATORY_CARE_PROVIDER_SITE_OTHER): Payer: Medicare Other | Admitting: Pulmonary Disease

## 2013-03-19 ENCOUNTER — Encounter: Payer: Self-pay | Admitting: Pulmonary Disease

## 2013-03-19 VITALS — BP 132/82 | HR 74 | Temp 98.0°F | Ht 71.0 in | Wt 171.0 lb

## 2013-03-19 DIAGNOSIS — R0609 Other forms of dyspnea: Secondary | ICD-10-CM

## 2013-03-19 DIAGNOSIS — R0600 Dyspnea, unspecified: Secondary | ICD-10-CM

## 2013-03-19 MED ORDER — WARFARIN SODIUM 5 MG PO TABS: ORAL_TABLET | ORAL | Status: DC

## 2013-03-19 NOTE — Telephone Encounter (Signed)
rx called to [facility] - left message for patient.

## 2013-03-19 NOTE — Progress Notes (Signed)
Chief Complaint  Patient presents with  . Acute Visit    Reports increased SOB. Had O2 at night but it was taken away due to his insurace. Wants to know if he needs to get back on O2.    History of Present Illness: [name] [name] is a 79 y.o. male with dyspnea.  He is followed by Dr. Delford Field.  He was last seen in pulmonary office in January 2013.  He has history of systolic heart failure with EF 20 to 25% on Echo from May 2014.  His previous spirometry from November 2012 did not show obstruction, but instead showed restrictive defect.  Chest xray from May 2014 showed stable chronic interstitial prominence and bilateral pleuroparenchymal scarring.  He has noticed problems with his breathing for years.  This has been getting gradually worse.  He denies cough, wheeze, or sputum.  He is not having chest pain.  He was in hospital recently for CHF exacerbation.  His breathing improved with therapy.  He had difficulty with rehab due to gout.  He also has difficulty with lower extremity claudication.  He can only ambulate with a walker.  He gets short of breath after about 1 block, but usually his leg pains limit his activity first.  He previously had home oxygen, but was told he no longer needed it.  He reports that during his last hospital stay he had oxygen desaturation with exertion, but was never given oxygen again at home.   [name] [name]  has a past medical history of HTN (hypertension); GERD (gastroesophageal reflux disease); Hyperlipidemia; Hypertensive cardiovascular disease; CAD (coronary artery disease); MI (myocardial infarction) (1989); Atrial fibrillation (1/09); Pulmonary granuloma; Ascending aortic aneurysm; COPD (chronic obstructive pulmonary disease); Shortness of breath; Ischemic cardiomyopathy (10/02/2011); CHF (congestive heart failure); Hematuria; Bleeding gastrointestinal; Diabetes mellitus without complication; and Gout.  [name] [name]  has past surgical history that includes Stomach  surgery; Mastoidectomy; Cardiac catheterization (10/01/2011); Coronary artery bypass graft (12/1994); vein bypass graft,aorto-fem-pop (1996); and Vagotomy (1973).  Prior to Admission medications   Medication Sig Start Date End Date Taking? Authorizing Provider  allopurinol (ZYLOPRIM) 300 MG tablet Take 300 mg by mouth daily.   Yes Historical Provider, MD  aspirin EC 81 MG EC tablet Take 1 tablet (81 mg total) by mouth daily. 01/01/13  Yes Brittainy Simmons, PA-C  atorvastatin (LIPITOR) 80 MG tablet Take 80 mg by mouth daily.   Yes Historical Provider, MD  carvedilol (COREG) 12.5 MG tablet Take 1 tablet (12.5 mg total) by mouth 2 (two) times daily with a meal. 01/01/13  Yes Brittainy Simmons, PA-C  cholecalciferol (VITAMIN D) 1000 UNITS tablet Take 1,000 Units by mouth daily.   Yes Historical Provider, MD  cilostazol (PLETAL) 50 MG tablet Take 1 tablet (50 mg total) by mouth 2 (two) times daily. 01/30/13  Yes Governor Rooks, MD  colchicine (COLCRYS) 0.6 MG tablet Take 0.6 mg by mouth 2 (two) times daily as needed (for acute gout flare).   Yes Historical Provider, MD  furosemide (LASIX) 20 MG tablet Take 3 tablets (60 mg total) by mouth 2 (two) times daily. 01/01/13  Yes Brittainy Simmons, PA-C  hydrALAZINE (APRESOLINE) 25 MG tablet Take 0.5 tablets (12.5 mg total) by mouth every 8 (eight) hours. 01/01/13  Yes Brittainy Sharol Harness, PA-C  isosorbide mononitrate (IMDUR) 15 mg TB24 Take 0.5 tablets (15 mg total) by mouth daily. 01/01/13  Yes Brittainy Simmons, PA-C  LORazepam (ATIVAN) 0.5 MG tablet Take 1 tablet (0.5 mg total) by mouth  at bedtime as needed for anxiety (to help sleep). For anxiety 03/11/13  Yes Ernestina Penna, MD  Multiple Vitamins-Minerals (CENTRUM SILVER ADULT 50+) TABS Take 1 tablet by mouth daily.   Yes Historical Provider, MD  nitroGLYCERIN (NITROSTAT) 0.4 MG SL tablet Place 1 tablet (0.4 mg total) under the tongue every 5 (five) minutes x 3 doses as needed for chest pain. 01/01/13  Yes Brittainy  Simmons, PA-C  potassium chloride SA (K-DUR,KLOR-CON) 20 MEQ tablet Take 20 mEq by mouth 2 (two) times daily.   Yes Historical Provider, MD  terazosin (HYTRIN) 5 MG capsule Take 5 mg by mouth at bedtime.     Yes Historical Provider, MD  warfarin (COUMADIN) 5 MG tablet TAKE ONE TABLET BY MOUTH EVERY DAY AS DIRECTED 01/21/13  Yes Ernestina Penna, MD    Allergies  Allergen Reactions  . Ace Inhibitors     Cough  . Cimetidine      unknown reaction, pt states per Dr. Jarold Motto  . Ciprofloxacin Other (See Comments)    unknown  . Nsaids     Dr does not want patient to take due to kidneys  . Zantac (Ranitidine Hcl)     Hepatitis per Dr. Jarold Motto  . Percocet (Oxycodone-Acetaminophen) Other (See Comments)    hallucinations     Physical Exam:  General - No distress ENT - No sinus tenderness, no oral exudate, no LAN Cardiac - s1s2 regular, no murmur Chest - Coarse breath sounds b/l, basilar crackles, no wheeze Back - No focal tenderness Abd - Soft, non-tender Ext - No edema Neuro - Normal strength Skin - No rashes Psych - normal mood, and behavior   Assessment/Plan:  Coralyn Helling, MD [facility] Pager:  [phone]

## 2013-03-19 NOTE — Patient Instructions (Addendum)
Chest xray today Will arrange for overnight oxygen test Will arrange for breathing test (PFT) Follow up in 2 to 3 weeks with Tammy Parrett or Dr. Delford Field

## 2013-03-19 NOTE — Assessment & Plan Note (Signed)
He has progressive dyspnea.  This could be related to his systolic heart failure and peripheral artery disease, as well as deconditioning.  He does have chronic parenchymal changes on CXR from May 2014.  Will repeat his chest xray and then will need to determine if he needs f/u CT chest.  Will also arrange for repeat PFT and overnight oximetry.    Will have him follow up with Tammy Parrett or Dr. Delford Field in 2 to 3 weeks to review test results.

## 2013-03-21 ENCOUNTER — Encounter: Payer: Self-pay | Admitting: Cardiovascular Disease

## 2013-03-21 ENCOUNTER — Telehealth: Payer: Self-pay | Admitting: Pulmonary Disease

## 2013-03-21 NOTE — Telephone Encounter (Signed)
Pt is aware of CXR results. Nothing further was needed. 

## 2013-03-21 NOTE — Telephone Encounter (Signed)
Dg Chest 2 View  03/19/2013   *RADIOLOGY REPORT*  Clinical Data: Dyspnea, history COPD, smoking, CHF, hypertension, coronary artery disease post MI and CABG, CHF  CHEST - 2 VIEW  Comparison: 12/29/2012  Findings: Enlargement of cardiac silhouette post CABG. Tortuous aorta. Pulmonary vascularity normal. Scattered interstitial changes mid-to-lower lungs similar to previous exam. Persistent small right pleural effusion. Emphysematous changes with question bibasilar fibrosis. No definite acute infiltrate, pleural effusion or pneumothorax. No acute osseous findings.  IMPRESSION: Enlargement of cardiac silhouette post CABG. COPD and chronic interstitial lung disease changes question fibrosis. No acute abnormalities.   Original Report Authenticated By: Ulyses Southward, M.D.   Will have my nurse inform patient that CXR shows chronic changes, but no acute findings.  Will defer decision to have CT chest until he has follow up with Tammy Parrett/Dr. Delford Field in August.  No change to current treatment plan.  Will route message to Dr. Delford Field to be aware of CXR results.

## 2013-03-26 ENCOUNTER — Telehealth: Payer: Self-pay | Admitting: Pulmonary Disease

## 2013-03-26 NOTE — Telephone Encounter (Signed)
Pt aware I am sending this to VS and nurse to advise on results. VS back in office on Thursday PM.

## 2013-03-27 NOTE — Telephone Encounter (Signed)
ONO with RA 03/20/13 >> Test time 8 hrs 31 min.  Baseline SpO2 93.4%, low SpO2 82%.  Spent 4 min 24 sec with SpO2 < 88%.  Will have my nurse inform pt that oxygen test looked okay.  He does not need supplemental oxygen at night at this time.    Will forward results to Dr. Delford Field for review.

## 2013-03-27 NOTE — Telephone Encounter (Signed)
Pt is aware of ONO results. 

## 2013-03-28 ENCOUNTER — Telehealth: Payer: Self-pay | Admitting: Critical Care Medicine

## 2013-03-28 DIAGNOSIS — R0600 Dyspnea, unspecified: Secondary | ICD-10-CM

## 2013-03-28 DIAGNOSIS — R05 Cough: Secondary | ICD-10-CM

## 2013-03-28 NOTE — Telephone Encounter (Signed)
Call the pt and tell him ONO pos for desaturation He needs to be on oxygen 2L qhs Confirm if he has oxygen, if not order from [employer]

## 2013-03-31 ENCOUNTER — Other Ambulatory Visit: Payer: Self-pay | Admitting: *Deleted

## 2013-03-31 MED ORDER — COLCHICINE 0.6 MG PO TABS: 0.6000 mg | ORAL_TABLET | Freq: Two times a day (BID) | ORAL | Status: DC | PRN

## 2013-04-01 ENCOUNTER — Encounter: Payer: Self-pay | Admitting: Adult Health

## 2013-04-01 ENCOUNTER — Telehealth: Payer: Self-pay | Admitting: Critical Care Medicine

## 2013-04-01 ENCOUNTER — Ambulatory Visit (HOSPITAL_COMMUNITY)
Admission: RE | Admit: 2013-04-01 | Discharge: 2013-04-01 | Disposition: A | Discharge status: Home / Self Care | Payer: Medicare Other | Source: Ambulatory Visit | Attending: Pulmonary Disease | Admitting: Pulmonary Disease

## 2013-04-01 ENCOUNTER — Ambulatory Visit (INDEPENDENT_AMBULATORY_CARE_PROVIDER_SITE_OTHER): Payer: Medicare Other | Admitting: Adult Health

## 2013-04-01 VITALS — BP 120/62 | HR 68 | Temp 97.0°F | Ht 71.0 in | Wt 174.0 lb

## 2013-04-01 DIAGNOSIS — J988 Other specified respiratory disorders: Secondary | ICD-10-CM | POA: Insufficient documentation

## 2013-04-01 DIAGNOSIS — R05 Cough: Secondary | ICD-10-CM

## 2013-04-01 DIAGNOSIS — R0600 Dyspnea, unspecified: Secondary | ICD-10-CM

## 2013-04-01 DIAGNOSIS — Z87891 Personal history of nicotine dependence: Secondary | ICD-10-CM | POA: Insufficient documentation

## 2013-04-01 LAB — PULMONARY FUNCTION TEST

## 2013-04-01 MED ORDER — ALBUTEROL SULFATE (5 MG/ML) 0.5% IN NEBU
2.5000 mg | INHALATION_SOLUTION | Freq: Once | RESPIRATORY_TRACT | Status: AC
  Administered 2013-04-01: 2.5 mg via RESPIRATORY_TRACT

## 2013-04-01 NOTE — Telephone Encounter (Signed)
Called, spoke with pt.  Informed him of ONO results and recs per Dr. Delford Field.  He verbalized understanding of this.  He doesn't currently have o2 and is aware we will have this set up through [employer].  He verbalized understanding and voiced no further questions or concerns at this time.

## 2013-04-01 NOTE — Telephone Encounter (Signed)
Crystal do you still have the ONO that was done on this pt. Dr. Delford Field did a phone note on 03-28-13 with the results. This needs to be faxed to [employer]. Also they state that dyspnea will not be covered. If the pt desaturated at night we need to use nocturnal hypoxemia. Please advise on report. Carron Curie, CMA

## 2013-04-01 NOTE — Telephone Encounter (Signed)
Patient seen today in Office by TP: Patient Instructions    We will hold on Oxygen for now as Medicare will not pay for it.  Continue on current regimen  follow up Dr. Delford Field In 6 weeks and As needed    ------------------------- Spoke with [facility] and made her aware of this Tammy verbalized understanding and stated she would place a note in patients records of this Nothing further needed at this time

## 2013-04-01 NOTE — Patient Instructions (Addendum)
We will hold on Oxygen for now as Medicare will not pay for it.  Continue on current regimen  follow up Dr. Delford Field  In 6 weeks and As needed

## 2013-04-08 NOTE — Progress Notes (Signed)
History of Present Illness: 79 yo male with upper airway instability syndrome and dyspnea, Previous on ACE inhibitor .  Past history is also pertinent in that patient was on mechanical ventilation for prolonged period time with associated tracheostomy placement with the patient's 1996 bypass surgery.  2012 >>ONO >O2 started for nocturnal desats  03/20/13 ONO -<5% desats>MCR will not cover o2.    03/19/13 OV with VS He is followed by Dr. Delford Field.  He was last seen in pulmonary office in January 2013.  He has history of systolic heart failure with EF 20 to 25% on Echo from May 2014.  His previous spirometry from November 2012 did not show obstruction, but instead showed restrictive defect.  Chest xray from May 2014 showed stable chronic interstitial prominence and bilateral pleuroparenchymal scarring.  He has noticed problems with his breathing for years.  This has been getting gradually worse.  He denies cough, wheeze, or sputum.  He is not having chest pain.  He was in hospital recently for CHF exacerbation.  His breathing improved with therapy.  He had difficulty with rehab due to gout.  He also has difficulty with lower extremity claudication.  He can only ambulate with a walker.  He gets short of breath after about 1 block, but usually his leg pains limit his activity first.  He previously had home oxygen, but was told he no longer needed it.  He reports that during his last hospital stay he had oxygen desaturation with exertion, but was never given oxygen again at home. >>ONO   04/01/13 Follow up  Pt returns for follow up and to discuss PFT  PFT today showed FEV1 76%, ratio 87 , no sign change s/p BD DLCO 46 % Had ONO done 7/24 with desats <5% of the time [facility] will not cover nocturnal O2.) .  CXR last ov  showed COPD and chronic interstitial changes at esp at bases  Last CT chest 03/2012 showed stable scarring and subpleural reticulation bilatterally .  He had no desats with ambulation at last ov   He says he is feeling better today with less dyspnea.  MAR review shows not pulmonary toxicity meds currently .   ROS Constitutional:   No  weight loss, night sweats,  Fevers, chills, fatigue, or  lassitude.  HEENT:   No headaches,  Difficulty swallowing,  Tooth/dental problems, or  Sore throat,                No sneezing, itching, ear ache, nasal congestion, post nasal drip,   CV:  No chest pain,  Orthopnea, PND,   anasarca, dizziness, palpitations, syncope.   GI  No heartburn, indigestion, abdominal pain, nausea, vomiting, diarrhea, change in bowel habits, loss of appetite, bloody stools.   Resp:    No coughing up of blood.  No change in color of mucus.  No wheezing.  No chest wall deformity  Skin: no rash or lesions.  GU: no dysuria, change in color of urine, no urgency or frequency.  No flank pain, no hematuria   MS:   .  No back pain.  Psych:  No change in mood or affect. No depression or anxiety.  No memory loss.      Physical Exam:  General - No distress ENT - No sinus tenderness, no oral exudate, no LAN Cardiac - s1s2 regular, no murmur Chest - Coarse breath sounds b/l, basilar crackles, no wheeze Back - No focal tenderness Abd - Soft, non-tender Ext - No edema  Neuro - Normal strength Skin - No rashes Psych - normal mood, and behavior

## 2013-04-09 NOTE — Assessment & Plan Note (Signed)
Cough and dypsnea -suspect are multifactoral  PFT shows restrcition with no sign airflow obstruction and no sign change with BD.  Diffucing capacity is decreased, does have scarring on CXR and CT  Last CT in august 2013 showed stable changes ONO with minimal desats, MCR will not cover as <5%  Will cont to monitor  Can conisder repeat CT chest on return OV with Dr. Delford Field   For now pt appears stable with no ambulatory desats

## 2013-04-14 ENCOUNTER — Ambulatory Visit (INDEPENDENT_AMBULATORY_CARE_PROVIDER_SITE_OTHER): Payer: Medicare Other | Admitting: Pharmacist

## 2013-04-14 DIAGNOSIS — R739 Hyperglycemia, unspecified: Secondary | ICD-10-CM

## 2013-04-14 DIAGNOSIS — I4891 Unspecified atrial fibrillation: Secondary | ICD-10-CM

## 2013-04-14 DIAGNOSIS — R7309 Other abnormal glucose: Secondary | ICD-10-CM

## 2013-04-14 LAB — POCT INR: INR: 2.5

## 2013-04-14 NOTE — Patient Instructions (Addendum)
Anticoagulation Dose Instructions as of 04/14/2013     Glynis Smiles Tue Wed Thu Fri Sat   New Dose 2.5 mg 2.5 mg 5 mg 2.5 mg 2.5 mg 2.5 mg 2.5 mg    Description       Continue 1/2 tablet [= 2.5mg ] daily except on tuesdays take 1 tablet [= 5mg ]       INR was 2.5 today

## 2013-04-14 NOTE — Progress Notes (Signed)
Patient states that he was told by [facility] that he was diabetic but not sure how they diagnosed that.  He wants labs to see if he is diabetic.  Drew A1c today. A1c was 6.1%

## 2013-04-17 ENCOUNTER — Ambulatory Visit: Payer: Medicare Other | Admitting: Cardiothoracic Surgery

## 2013-04-17 ENCOUNTER — Telehealth: Payer: Self-pay | Admitting: Family Medicine

## 2013-04-18 NOTE — Telephone Encounter (Signed)
Ok to give [employer]

## 2013-04-18 NOTE — Telephone Encounter (Signed)
Wants samples but not on his med list. He wants uloric

## 2013-04-21 NOTE — Telephone Encounter (Signed)
Samples given to pt 

## 2013-04-23 ENCOUNTER — Telehealth: Payer: Self-pay | Admitting: Critical Care Medicine

## 2013-04-23 DIAGNOSIS — R05 Cough: Secondary | ICD-10-CM

## 2013-04-23 NOTE — Telephone Encounter (Signed)
PFTs reviewed

## 2013-04-29 ENCOUNTER — Ambulatory Visit (INDEPENDENT_AMBULATORY_CARE_PROVIDER_SITE_OTHER): Payer: Medicare Other

## 2013-04-29 ENCOUNTER — Ambulatory Visit (INDEPENDENT_AMBULATORY_CARE_PROVIDER_SITE_OTHER): Payer: Medicare Other | Admitting: Family Medicine

## 2013-04-29 ENCOUNTER — Other Ambulatory Visit: Payer: Self-pay | Admitting: *Deleted

## 2013-04-29 VITALS — BP 139/77 | HR 84 | Temp 98.4°F | Wt 169.0 lb

## 2013-04-29 DIAGNOSIS — R0602 Shortness of breath: Secondary | ICD-10-CM

## 2013-04-29 MED ORDER — METOLAZONE 2.5 MG PO TABS: ORAL_TABLET | ORAL | Status: DC

## 2013-04-29 MED ORDER — TERAZOSIN HCL 5 MG PO CAPS: 5.0000 mg | ORAL_CAPSULE | Freq: Every day | ORAL | Status: AC

## 2013-04-29 NOTE — Telephone Encounter (Signed)
Rx was sent to pharmacy electronically via [employer] 

## 2013-04-29 NOTE — Patient Instructions (Addendum)
Heart Failure  Heart failure is a condition in which the heart has trouble pumping blood. This means your heart does not pump blood efficiently for your body to work well. In some cases of heart failure, fluid may back up into your lungs or you may have swelling (edema) in your lower legs. Heart failure is a long-term (chronic) condition. It is important for you to take good care of yourself and follow your caregiver's treatment plan.  CAUSES    Health conditions:   High blood pressure (hypertension) causes the heart muscle to work harder than normal. When pressure in the blood vessels is high, the heart needs to pump (contract) with more force in order to circulate blood throughout the body. High blood pressure eventually causes the heart to become stiff and weak.   Coronary artery disease (CAD) is the buildup of cholesterol and fat (plaque) in the arteries of the heart. The blockage in the arteries deprives the heart muscle of oxygen and blood. This can cause chest pain and may lead to a heart attack. High blood pressure can also contribute to CAD.   Heart attack (myocardial infarction) occurs when 1 or more arteries in the heart become blocked. The loss of oxygen damages the muscle tissue of the heart. When this happens, part of the heart muscle dies. The injured tissue does not contract as well and weakens the heart's ability to pump blood.   Abnormal heart valves can cause heart failure when the heart valves do not open and close properly. This makes the heart muscle pump harder to keep the blood flowing.   Heart muscle disease (cardiomyopathy or myocarditis) is damage to the heart muscle from a variety of causes. These can include drug or alcohol abuse, infections, or unknown reasons. These can increase the risk of heart failure.   Lung disease makes the heart work harder because the lungs do not work properly. This can cause a strain on the heart, leading it to fail.    Diabetes increases the risk of heart failure. High blood sugar contributes to high fat (lipid) levels in the blood. Diabetes can also cause slow damage to tiny blood vessels that carry important nutrients to the heart muscle. When the heart does not get enough oxygen and food, it can cause the heart to become weak and stiff. This leads to a heart that does not contract efficiently.   Other diseases can contribute to heart failure. These include abnormal heart rhythms, thyroid problems, and low blood counts (anemia).   Unhealthy behaviors:   Being overweight.   Smoking or chewing tobacco.   Eating foods high in fat and cholesterol.   Eating foods or drinking beverages high in salt.   Abusing drugs or alcohol.   Lacking physical activity.  SYMPTOMS   Heart failure symptoms may vary and can be hard to detect. Symptoms may include:   Shortness of breath with activity, such as climbing stairs.   Persistent cough.   Swelling of the feet, ankles, legs, or abdomen.   Unexplained, sudden weight gain.   Difficulty breathing when lying flat (orthopnea).   Waking from sleep because of the need to sit up and get more air.   Rapid heartbeat.   Fatigue and loss of energy.   Feeling lightheaded, dizzy, or close to fainting.   Loss of appetite.   Nausea.   Increased urination during the night (nocturia).  DIAGNOSIS   A diagnosis of heart failure is based on your history, symptoms, physical   examination, and diagnostic tests.  Diagnostic tests for heart failure may include:   Electrocardiography.   Chest X-ray.   Blood tests.   Exercise stress test.   Echocardiography.   Cardiac angiography.   Radionuclide scans.  TREATMENT   Treatment is aimed at managing the symptoms of heart failure. Medicines, behavioral changes, or surgical intervention may be necessary to treat heart failure.   Medicines to help treat heart failure may include:    Angiotensin-converting enzyme (ACE) inhibitors. This type of medicine blocks the effects of a blood protein called angiotensin-converting enzyme. ACE inhibitors relax (dilate) the blood vessels and help lower blood pressure.   Angiotensin receptor blockers. This type of medicine blocks the actions of a blood protein called angiotensin. Angiotensin receptor blockers dilate the blood vessels and help lower blood pressure.   Aldosterone antagonists. This type of medicine helps rid your body of extra fluid. This loss of extra fluid lowers the volume of blood the heart pumps.   Water pills (diuretics). Diuretics cause the kidneys to remove salt and water from the blood. The extra fluid is removed through urination.   Beta blockers. These prevent the heart from beating too fast and improve heart muscle strength.   Digitalis. This increases the force of the heartbeat.   Healthy behavior changes include:   Obtaining and maintaining a healthy weight.   Stopping smoking or chewing tobacco.   Eating heart healthy foods.   Limiting or avoiding alcohol.   Stopping drug use.   Becoming as physically active as possible.   Surgical treatment for heart failure may include:   A procedure to open blocked arteries, repair damaged heart valves, or remove damaged heart muscle tissue.   A pacemaker to improve heart muscle function and control certain abnormal heart rhythms.   An internal cardioverter defibrillator to possibly prevent sudden cardiac death.   A left ventricular assist device to assist the pumping ability of the heart.  HOME CARE INSTRUCTIONS    Take your medicine as directed by your caregiver. Medicines are important in reducing the workload of your heart, slowing the progression of heart failure, and improving your symptoms.   Do not stop taking your medicine unless directed by your caregiver.   Do not skip any dose of medicine.    Refill your prescriptions before you run out of medicine. Your medicines are needed every day.   Take over-the-counter medicine only as directed by your caregiver or pharmacist.   Stay physically active. Your caregiver or cardiac rehabilitation program can help determine your level of physical activity. It is important to follow your specific physical activity program. Regular physical activity can control weight and improve your energy, endurance, health, cholesterol levels, mood, and nighttime sleep. It is important to pace your physical activity to avoid shortness of breath or chest pain. It is recommended to rest for 1 hour before and after meals.   Eat heart healthy foods. Food choices should be low in saturated fat, trans fat, cholesterol, and salt (sodium). Healthy choices include fresh or frozen fruits and vegetables, fish, lean meats, legumes, fat-free or low-fat dairy products, and whole grain or high fiber foods. Limit your sodium to 1500 milligrams (mg) each day or as recommended by your caregiver. Talk to a dietitian to learn more about heart healthy foods and healthy seasonings.   Use healthy cooking methods. Healthy cooking methods include roasting, grilling, broiling, baking, poaching, steaming, or stir-frying. Talk to a dietitian to learn more about healthy cooking methods.     Limit fluids if directed by your caregiver. Fluid restriction may reduce symptoms of heart failure in some individuals.   Weigh yourself every day. Daily weights are important in the early recognition of excess fluid. You should weigh yourself every morning after you urinate and before you eat breakfast. Wear the same amount of clothing each time you weigh yourself. Record your daily weight. Provide your caregiver with your weight record.   Monitor and record your blood pressure if directed by your caregiver.   Check your pulse if directed by your caregiver.    If you are overweight, it is time to lose and maintain a healthy weight.   Stop smoking or chewing tobacco. Nicotine makes your heart work harder by causing your blood vessels to constrict. Do not use nicotine gum or patches before talking to your caregiver.   Schedule and attend follow-up visits as directed by your caregiver. It is important to keep all your appointments.   Limit alcohol intake to no more than 1 drink per day for nonpregnant women and 2 drinks per day for men. Drinking more than that is harmful to your heart. Tell your caregiver if you drink alcohol several times a week. Talk with your caregiver about whether alcohol is safe for you. If your heart has already been damaged by alcohol or you have severe heart failure, drinking alcohol should be stopped completely.   Stop illegal drug use.   Stay up-to-date with immunizations. It is especially important to prevent respiratory infections through current pneumococcal and influenza immunizations.   Manage other health conditions such as hypertension, diabetes, thyroid disease, or abnormal heart rhythms as directed by your caregiver.   Learn to manage stress.   Plan rest periods when fatigued.   Learn strategies to manage high temperatures. If the weather is extremely hot:   Avoid vigorous physical activity.   Use air conditioning or fans or seek a cooler location.   Avoid caffeine and alcohol.   Wear loose-fitting, lightweight, and light-colored clothing.   Learn strategies to manage cold temperatures. If the weather is extremely cold:   Avoid vigorous physical activity.   Layer clothes.   Wear mittens or gloves, a hat, and a scarf when going outside.   Avoid alcohol.   Obtain ongoing education and support as needed.   Participate or seek rehabilitation as needed to maintain or improve independence and quality of life.  SEEK MEDICAL CARE IF:    Your weight increases by 3 lb/1.4 kg in 1 day or 5 lb/2.3 kg in a week.    You have increasing shortness of breath that is unusual for you.   You are unable to participate in your usual physical activities.   You tire easily.   You cough more than normal, especially with physical activity.   You have any or more swelling in areas such as your hands, feet, ankles, or abdomen.   You are unable to sleep because it is hard to breathe.   You cough up bloody mucus (sputum).   You feel like your heart is beating fast (palpitations).   You become dizzy or lightheaded upon standing up.  SEEK IMMEDIATE MEDICAL CARE IF:    You have difficulty breathing.   There is a change in mental status such as decreased alertness or difficulty with concentration.   You have a pain or discomfort in your chest.   You have an episode of fainting (syncope).   You are in an area of high temperatures and   you have signs of heat exhaustion:   Heavy sweating.   Muscle cramps.   Weakness.   Dizziness.   Headaches.   Fainting.   You are in an area of cold temperatures and you have signs of hypothermia:   Clumsiness.   Confusion.   Sleepiness.   Shivering.  MAKE SURE YOU:    Understand these instructions.   Will watch your condition.   Will get help right away if you are not doing well or get worse.  Document Released: 08/14/2005 Document Revised: 07/31/2012 Document Reviewed: 03/14/2012  [employer] Patient Information 2014 [employer].

## 2013-04-29 NOTE — Progress Notes (Signed)
  Subjective:    Patient ID: GEOVONNI MEYERHOFF, male    DOB: 02-21-1926, 79 y.o.   MRN: [id number]  HPI This 79 y.o. male presents for evaluation of difficulty breathing last night.  He has hx Of CHF and gets this occasionally.  He states he has DOE and difficulty with sleep Last night due to chf.  He does not want any heroics he states and has declined AICD Pacer/defib for CHF.  He states he just wants to increase his dieuretics for a few days.   Review of Systems C/o SOB and DOE. No chest pain HA, dizziness, vision change, N/V, diarrhea, constipation, dysuria, urinary urgency or frequency, myalgias, arthralgias or rash.     Objective:   Physical Exam Vital signs noted  Chronically ill appearing male with SOB.  HEENT - Head atraumatic Normocephalic                Eyes - PERRLA, Conjuctiva - clear Sclera- Clear EOMI                Ears - EAC's Wnl TM's Wnl Gross Hearing WNL                Nose - Nares patent                 Throat - oropharanx wnl                Neck - 2 plus JVD Respiratory - Lungs CTA and diminished bilateral Cardiac - RRR S1 and S2 without murmur GI - Abdomen soft Nontender and bowel sounds active x 4 Extremities - No edema. Neuro - Grossly intact.  CXR - Bilateral lung fields with congestion consistent with CHF     Assessment & Plan:  Shortness of breath - Plan: DG Chest 2 View, metolazone (ZAROXOLYN) 2.5 MG tablet One po 1/2 hour prior to am lasix x 3days.  Follow up in 2 days for re-eval or prn.

## 2013-05-08 ENCOUNTER — Encounter: Payer: Self-pay | Admitting: Cardiothoracic Surgery

## 2013-05-08 ENCOUNTER — Ambulatory Visit (INDEPENDENT_AMBULATORY_CARE_PROVIDER_SITE_OTHER): Payer: Medicare Other | Admitting: Cardiothoracic Surgery

## 2013-05-08 VITALS — BP 125/65 | HR 78 | Resp 20 | Ht 70.0 in | Wt 169.0 lb

## 2013-05-08 DIAGNOSIS — I77810 Thoracic aortic ectasia: Secondary | ICD-10-CM

## 2013-05-08 NOTE — Progress Notes (Signed)
[address]       [place] [address]             [id number]         JACQUAN SAVAS [facility] Medical Record #[id number] Date of Birth: 1925/10/27  Referring: Dr [name] Primary Care: [name], MD  Chief Complaint:    Chief Complaint  Patient presents with  . Follow-up    1 year f/u, discuss dilated ascending aorta     History of Present Illness:    Patient returns today for one year followup. He has a known dilated ascending aorta and recurrent coronary occlusive disease. He has  presented with exacerbation congestive heart failure on several occasions requiring hospitalization during the past year.  Currently he is limited in his physical early primarily due to doubt in both lower extremities.   Current Activity/ Functional Status: Patient is not independent with mobility/ambulation, transfers, ADL's, IADL's.   Past Medical History  Diagnosis Date  . HTN (hypertension)   . GERD (gastroesophageal reflux disease)   . Hyperlipidemia   . Hypertensive cardiovascular disease   . CAD (coronary artery disease)   . MI (myocardial infarction) 1989  . Atrial fibrillation 1/09    s/p DCCV to NSR   . Pulmonary granuloma     left  . Ascending aortic aneurysm   . COPD (chronic obstructive pulmonary disease)   . Shortness of breath   . CAD (coronary artery disease) of bypass graft,SVG to RCA occluded, Seq. limb of SVG to PLA1 &PLA2 occluded, Seq. VG to Ramus occluded, PATENT LIMA to LAD, Patent VG to DX1, Patent Vg to PDA 10/02/11 10/02/2011  . Ischemic cardiomyopathy 10/02/2011  . CHF (congestive heart failure)     Past Surgical History  Procedure Laterality Date  . Stomach surgery    . Mastoidectomy      right ear  . Cardiac catheterization  10/01/2011  . Coronary artery bypass graft  12/1994    LIMA-LAD, VG-diag; VG-PDA; occl. VG to PLA,occluded VG to LCX   . Pr vein bypass graft,aorto-fem-pop  1996  . Vagotomy  1973    Family History    Problem Relation Age of Onset  . Liver cancer Sister   . Hypertension Sister   . Cancer Sister   . Diabetes Sister   . Hypertension Father   . Heart disease Father   . Hyperlipidemia Father   . Heart disease Mother     History   Social History  . Marital Status: Widowed    Spouse Name: N/A    Number of Children: 4  . Years of Education: N/A   Occupational History  . 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     Social History Main Topics  . Smoking status: Former Smoker -- 0.50 packs/day for 45 years    Types: Cigarettes, Cigars    Start date: 08/28/1942    Quit date: 12/26/1987  . Smokeless tobacco: Never Used  . Alcohol Use: No     Comment: Occas Beer  . Drug Use: No  . Sexual Activity: Not on file   Other Topics Concern  . Not on file   Social History Narrative  . No narrative on file    History  Smoking status  . Former Smoker -- 0.50 packs/day for 45 years  . Types: Cigarettes, Cigars  . Start date: 08/28/1942  . Quit date: 12/26/1987  Smokeless tobacco  . Never Used    History  Alcohol Use No    Comment: Occas Beer     Allergies  Allergen Reactions  . Ace Inhibitors     Cough  . Cimetidine      unknown reaction, pt states per Dr. Jarold Motto  . Ciprofloxacin Other (See Comments)    unknown  . Nsaids     Dr does not want patient to take due to kidneys  . Zantac [Ranitidine Hcl]     Hepatitis per Dr. Jarold Motto  . Percocet [Oxycodone-Acetaminophen] Other (See Comments)    hallucinations    Current Outpatient Prescriptions  Medication Sig Dispense Refill  . allopurinol (ZYLOPRIM) 300 MG tablet Take 300 mg by mouth daily.      Marland Kitchen aspirin EC 81 MG EC tablet Take 1 tablet (81 mg total) by mouth daily.      Marland Kitchen atorvastatin (LIPITOR) 80 MG tablet Take 80 mg by mouth daily.      . carvedilol (COREG) 12.5 MG tablet Take 1 tablet (12.5 mg total) by mouth 2 (two) times daily with a meal.  60 tablet  5  . cholecalciferol (VITAMIN D) 1000 UNITS tablet Take 1,000  Units by mouth daily.      . colchicine (COLCRYS) 0.6 MG tablet Take 1 tablet (0.6 mg total) by mouth 2 (two) times daily as needed (for acute gout flare).  30 tablet  2  . furosemide (LASIX) 20 MG tablet Take 3 tablets (60 mg total) by mouth 2 (two) times daily.  180 tablet  5  . isosorbide mononitrate (IMDUR) 15 mg TB24 Take 0.5 tablets (15 mg total) by mouth daily.  30 tablet  5  . LORazepam (ATIVAN) 0.5 MG tablet Take 1 tablet (0.5 mg total) by mouth at bedtime as needed for anxiety (to help sleep). For anxiety  30 tablet  2  . metolazone (ZAROXOLYN) 2.5 MG tablet Take one tablet half an hour prior to first lasix in the am for 3 days prn SOB  3 tablet  5  . Multiple Vitamins-Minerals (CENTRUM SILVER ADULT 50+) TABS Take 1 tablet by mouth daily.      . nitroGLYCERIN (NITROSTAT) 0.4 MG SL tablet Place 1 tablet (0.4 mg total) under the tongue every 5 (five) minutes x 3 doses as needed for chest pain.  25 tablet  5  . potassium chloride SA (K-DUR,KLOR-CON) 20 MEQ tablet Take 20 mEq by mouth 2 (two) times daily.      Marland Kitchen terazosin (HYTRIN) 5 MG capsule Take 1 capsule (5 mg total) by mouth at bedtime.  30 capsule  11  . warfarin (COUMADIN) 5 MG tablet Take 1/2 to 1 tablet daily as directed by anticoagulation clinic  30 tablet  2   No current facility-administered medications for this visit.       Review of Systems:     Cardiac Review of Systems: Y or N  Chest Pain [  y  ]  Resting SOB [ y  ] Exertional SOB  [ y ]  [name] [name]  ]   Pedal Edema [ y]    Palpitations [name] ] Syncope  [  n]   Presyncope [  y ]  General Review of Systems: [Y] = yes [  ]=no Constitional: recent weight change [name]  ]; anorexia [  ]; fatigue [name]  ]; nausea [  ]; night sweats [  ]; fever [  ]; or chills [  ];  Dental: poor dentition[ y ];   Eye : blurred vision [  ]; diplopia [   ]; vision  changes [  ];  Amaurosis fugax[  ]; Resp: cough [  ];  wheezing[  ];  hemoptysis[  ]; shortness of breath[  ]; paroxysmal nocturnal dyspnea[  ]; dyspnea on exertion[  ]; or orthopnea[  ];  GI:  gallstones[  ], vomiting[  ];  dysphagia[  ]; melena[  ];  hematochezia [  ]; heartburn[  ];   Hx of  Colonoscopy[  ]; GU: kidney stones [  ]; hematuria[  ];   dysuria [  ];  nocturia[  ];  history of     obstruction [ n ];             Skin: rash, swelling[  ];, hair loss[  ];  peripheral edema[  ];  or itching[  ]; Musculosketetal: myalgias[  ];  joint swelling[y  ];  joint erythema[ y ];  joint pain[  ];  back pain[ y ];  Heme/Lymph: bruising[  ];  bleeding[  ];  anemia[  ];  Neuro: TIA[  ];  headaches[  ];  stroke[  ];  vertigo[  ];  seizures[  ];   paresthesias[  ];  difficulty walking[ y ];  Psych:depression[  ]; anxiety[  ];  Endocrine: diabetes[  ];  thyroid dysfunction[  ];  Immunizations: Flu [  ]; Pneumococcal[  ];  Other:  Physical Exam: BP 125/65  Pulse 78  Resp 20  Ht 5\' 10"  (1.778 m)  Wt 169 lb (76.658 kg)  BMI 24.25 kg/m2  SpO2 99%  General appearance: alert, cooperative, appears older than stated age and fatigued Neurologic: intact Heart: regular rate and rhythm, S1, S2 normal, no murmur, click, rub or gallop and normal apical impulse Lungs: clear to auscultation bilaterally and normal percussion bilaterally Abdomen: soft, non-tender; bowel sounds normal; no masses,  no organomegaly Extremities: Bilateral tenderness in his ankles and great toe consistent with gout Sternum is stable and well healed from previous coronary artery bypass grafting in 1996   Diagnostic Studies & Laboratory data:     Recent Radiology Findings:   No results found.    Recent Lab Findings: Lab Results  Component Value Date   WBC 6.7 03/11/2013   HGB 14.4 03/11/2013   HCT 41.8* 03/11/2013   PLT 140* 12/30/2012   GLUCOSE 103* 03/11/2013   CHOL 223* 03/11/2013   TRIG 104 03/11/2013   HDL 44 03/11/2013    LDLCALC 158* 03/11/2013   ALT 11 03/11/2013   AST 16 03/11/2013   NA 145 03/11/2013   K 3.8 03/11/2013   CL 104 03/11/2013   CREATININE 1.71* 03/11/2013   BUN 27* 03/11/2013   CO2 30 03/11/2013   TSH 6.276* 12/29/2012   INR 2.5 04/14/2013   HGBA1C 6.1 % 04/14/2013   Wt Readings from Last 3 Encounters:  05/08/13 169 lb (76.658 kg)  04/29/13 169 lb (76.658 kg)  04/01/13 174 lb (78.926 kg)      Assessment / Plan:   Patient comes in today with a followup . With the patient's severe underlying medical problems and is desire not to have any major intervention performed we discussed the natural history of dilated ascending aorta and congestive heart failure. He is turned down having a defibrillator or wearing a life vest, his rationale is he does not wish to be resuscitated to end up with the same problems that he currently has. He  would like to make a followup appointment in one year which we will do.  The patient is not interested in any operative intervention, nor do I think redo coronary artery bypass grafting and replacing his descending aorta would benefit him. On his visit this year he is again confirmed the desire not to have any surgical cardiac intervention.    Marland Kitchen HTN (hypertension)   . GERD (gastroesophageal reflux disease)   . Hyperlipidemia   . Hypertensive cardiovascular disease   . CAD (coronary artery disease)   . MI (myocardial infarction) 1989  . Atrial fibrillation 1/09    s/p DCCV to NSR   . Pulmonary granuloma     left  . Ascending aortic aneurysm   . COPD (chronic obstructive pulmonary disease)   . Shortness of breath   . CAD (coronary artery disease) of bypass graft,SVG to RCA occluded, Seq. limb of SVG to PLA1 &PLA2 occluded, Seq. VG to Ramus occluded, PATENT LIMA to LAD, Patent VG to DX1, Patent Vg to PDA 10/02/11 10/02/2011  . Ischemic cardiomyopathy 10/02/2011  . CHF (congestive heart failure)        Delight Ovens MD  Beeper [phone] Office  [phone] 05/08/2013 4:34 PM

## 2013-05-19 ENCOUNTER — Ambulatory Visit (INDEPENDENT_AMBULATORY_CARE_PROVIDER_SITE_OTHER): Payer: Medicare Other | Admitting: Pharmacist

## 2013-05-19 DIAGNOSIS — Z23 Encounter for immunization: Secondary | ICD-10-CM

## 2013-05-19 DIAGNOSIS — I4891 Unspecified atrial fibrillation: Secondary | ICD-10-CM

## 2013-05-19 NOTE — Patient Instructions (Addendum)
Anticoagulation Dose Instructions as of 05/19/2013     Glynis Smiles Tue Wed Thu Fri Sat   New Dose 2.5 mg 2.5 mg 5 mg 2.5 mg 2.5 mg 2.5 mg 2.5 mg    Description       Continue 1/2 tablet [= 2.5mg ] daily except on tuesdays take 1 tablet [= 5mg ]     INR was 2.1 today

## 2013-06-03 ENCOUNTER — Ambulatory Visit (INDEPENDENT_AMBULATORY_CARE_PROVIDER_SITE_OTHER): Payer: Medicare Other | Admitting: Family Medicine

## 2013-06-03 ENCOUNTER — Ambulatory Visit (INDEPENDENT_AMBULATORY_CARE_PROVIDER_SITE_OTHER): Payer: Medicare Other

## 2013-06-03 VITALS — BP 139/80 | HR 64 | Temp 96.6°F | Ht 70.0 in | Wt 172.6 lb

## 2013-06-03 DIAGNOSIS — I4891 Unspecified atrial fibrillation: Secondary | ICD-10-CM

## 2013-06-03 DIAGNOSIS — I5043 Acute on chronic combined systolic (congestive) and diastolic (congestive) heart failure: Secondary | ICD-10-CM

## 2013-06-03 DIAGNOSIS — I509 Heart failure, unspecified: Secondary | ICD-10-CM

## 2013-06-03 DIAGNOSIS — R231 Pallor: Secondary | ICD-10-CM

## 2013-06-03 DIAGNOSIS — R17 Unspecified jaundice: Secondary | ICD-10-CM

## 2013-06-03 DIAGNOSIS — R0602 Shortness of breath: Secondary | ICD-10-CM

## 2013-06-03 DIAGNOSIS — J449 Chronic obstructive pulmonary disease, unspecified: Secondary | ICD-10-CM

## 2013-06-03 LAB — POCT CBC
Granulocyte percent: 59.8 %G (ref 37–80)
Lymph, poc: 2.3 (ref 0.6–3.4)
MPV: 6.8 fL (ref 0–99.8)
POC Granulocyte: 4 (ref 2–6.9)
POC LYMPH PERCENT: 33.6 %L (ref 10–50)
Platelet Count, POC: 174 10*3/uL (ref 142–424)
RBC: 4.7 M/uL (ref 4.69–6.13)
RDW, POC: 13.8 %
WBC: 6.7 10*3/uL (ref 4.6–10.2)

## 2013-06-03 MED ORDER — CEFUROXIME AXETIL 250 MG PO TABS: 250.0000 mg | ORAL_TABLET | Freq: Two times a day (BID) | ORAL | Status: DC

## 2013-06-03 NOTE — Patient Instructions (Signed)
Use 02 as directed at 2 L per minute at night Monitor weight regularly Watch sodium intake If problems with shortness of breath continue please get back in touch with [place] We will arrange a visit with the cardiologist that comes to [facility]

## 2013-06-03 NOTE — Progress Notes (Signed)
Referral made to [facility] for oxygen

## 2013-06-03 NOTE — Progress Notes (Signed)
Subjective:    Patient ID: [name] [name], male    DOB: 1926-04-20, 79 y.o.   MRN: [id number]  HPI Patient here today for SOB. Patient presents this afternoon with increasing complaints of shortness of breath noticed during the night and early morning. He admitted to arising but did lay back down with extra pillows under his head. He denies chest pain or tightness. He denies any nausea vomiting diarrhea melena or blood in the stool. He has had a slight cough. He denies any fever. His pulse ox on arrival was 93.    Patient Active Problem List   Diagnosis Date Noted  . DM (diabetes mellitus) 01/06/2013  . Acute and chronic respiratory failure with hypoxia - due to Acute on Chronic combined CHF; resolved in ER after diuresis 11/07/2012  . Acute on chronic combined systolic and diastolic CHF, NYHA class 3 11/02/2012  . Chronic total occlusion of artery of the extremities 12/27/2011  . Dyspnea 12/09/2011  . Abnormal CXR, RLL infiltrate 12/09/2011  . AAA (abdominal aortic aneurysm) 5.6cm by CT 7/12 12/09/2011  . Stage 3 chronic renal impairment associated with type 2 diabetes mellitus 12/09/2011  . Ventricular tachycardia, non-sustained; secondary to Ischemic CM 10/04/2011    Class: Diagnosis of  . Chronic renal disease, stage III 10/04/2011    Class: Diagnosis of  . Ischemic cardiomyopathy, EF 20-25% by 2D Feb 2012 10/02/2011  . Chronic anticoagulation, secondary to Chronic a. fib. 09/30/2011  . GERD (gastroesophageal reflux disease) 03/15/2011  . Stricture esophagus 03/15/2011  . Gastritis, chronic 03/15/2011  . Gout 12/21/2007  . PEPTIC STRICTURE 12/21/2007  .  CABG 1999, Cath [facility] 2013 with patent grafts and distal disease -refused life vest and ICD 11/15/2007  . Atrial fibrillation, chronic- permanent 11/15/2007  . Cough 11/15/2007  . HYPERLIPIDEMIA 11/13/2007  . HYPERTENSION 11/13/2007  . HYPERTENSIVE CARDIOVASCULAR DISEASE 11/13/2007   Outpatient Encounter Prescriptions as of  06/03/2013  Medication Sig Dispense Refill  . allopurinol (ZYLOPRIM) 300 MG tablet Take 300 mg by mouth daily.      Marland Kitchen aspirin EC 81 MG EC tablet Take 1 tablet (81 mg total) by mouth daily.      Marland Kitchen atorvastatin (LIPITOR) 80 MG tablet Take 80 mg by mouth daily.      . carvedilol (COREG) 12.5 MG tablet Take 1 tablet (12.5 mg total) by mouth 2 (two) times daily with a meal.  60 tablet  5  . cholecalciferol (VITAMIN D) 1000 UNITS tablet Take 1,000 Units by mouth daily.      . colchicine (COLCRYS) 0.6 MG tablet Take 1 tablet (0.6 mg total) by mouth 2 (two) times daily as needed (for acute gout flare).  30 tablet  2  . furosemide (LASIX) 20 MG tablet Take 3 tablets (60 mg total) by mouth 2 (two) times daily.  180 tablet  5  . isosorbide mononitrate (IMDUR) 15 mg TB24 Take 0.5 tablets (15 mg total) by mouth daily.  30 tablet  5  . LORazepam (ATIVAN) 0.5 MG tablet Take 1 tablet (0.5 mg total) by mouth at bedtime as needed for anxiety (to help sleep). For anxiety  30 tablet  2  . metolazone (ZAROXOLYN) 2.5 MG tablet Take one tablet half an hour prior to first lasix in the am for 3 days prn SOB  3 tablet  5  . Multiple Vitamins-Minerals (CENTRUM SILVER ADULT 50+) TABS Take 1 tablet by mouth daily.      . nitroGLYCERIN (NITROSTAT) 0.4 MG SL tablet Place 1  tablet (0.4 mg total) under the tongue every 5 (five) minutes x 3 doses as needed for chest pain.  25 tablet  5  . potassium chloride SA (K-DUR,KLOR-CON) 20 MEQ tablet Take 20 mEq by mouth 2 (two) times daily.      Marland Kitchen terazosin (HYTRIN) 5 MG capsule Take 1 capsule (5 mg total) by mouth at bedtime.  30 capsule  11  . warfarin (COUMADIN) 5 MG tablet Take 1/2 to 1 tablet daily as directed by anticoagulation clinic  30 tablet  2   No facility-administered encounter medications on file as of 06/03/2013.    Review of Systems  Constitutional: Negative.   HENT: Negative.   Eyes: Negative.   Respiratory: Positive for shortness of breath.   Cardiovascular:  Negative.   Gastrointestinal: Negative.   Endocrine: Negative.   Genitourinary: Negative.   Musculoskeletal: Negative.   Skin: Negative.   Allergic/Immunologic: Negative.   Neurological: Negative.   Hematological: Negative.   Psychiatric/Behavioral: Negative.        Objective:   Physical Exam  Nursing note and vitals reviewed. Constitutional: He is oriented to person, place, and time. No distress.  Elderly, frail and somewhat cyanotic appearing patient  HENT:  Head: Normocephalic and atraumatic.  Right Ear: External ear normal.  Left Ear: External ear normal.  Nose: Nose normal.  Mouth/Throat: Oropharynx is clear and moist. No oropharyngeal exudate.  Eyes: Conjunctivae are normal. Right eye exhibits no discharge. Left eye exhibits no discharge. Scleral icterus: this is questionable.  Neck: Normal range of motion. Neck supple. No JVD present. No thyromegaly present.  Cardiovascular: Exam reveals friction rub. Exam reveals no gallop.   No murmur heard. Heart was irregular irregular at 84 per minute  Pulmonary/Chest: Effort normal and breath sounds normal. No respiratory distress. He has no wheezes. He has no rales.  Abdominal: Soft. Bowel sounds are normal. He exhibits no distension and no mass. Tenderness: slight epigastric tenderness. There is no rebound and no guarding.  Musculoskeletal: He exhibits no edema.  Limited and hesitant range of motion. Patient has a walker with him today.  Lymphadenopathy:    He has no cervical adenopathy.  Neurological: He is alert and oriented to person, place, and time.  Skin: Skin is warm and dry. No rash noted. He is not diaphoretic. There is pallor.  Psychiatric: He has a normal mood and affect. His behavior is normal. Judgment and thought content normal.   BP 139/80  Pulse 64  Temp(Src) 96.6 F (35.9 C) (Oral)  Wt 172 lb 9.6 oz (78.291 kg)  BMI 24.77 kg/m2  SpO2 93% Results for orders placed in visit on 06/03/13  POCT CBC       Result Value Range   WBC 6.7  4.6 - 10.2 K/uL   Lymph, poc 2.3  0.6 - 3.4   POC LYMPH PERCENT 33.6  10 - 50 %L   POC Granulocyte 4.0  2 - 6.9   Granulocyte percent 59.8  37 - 80 %G   RBC 4.7  4.69 - 6.13 M/uL   Hemoglobin 14.4  14.1 - 18.1 g/dL   HCT, POC 16.1 (*) 09.6 - 53.7 %   MCV 90.0  80 - 97 fL   MCH, POC 30.5  27 - 31.2 pg   MCHC 33.9  31.8 - 35.4 g/dL   RDW, POC 04.5     Platelet Count, POC 174.0  142 - 424 K/uL   MPV 6.8  0 - 99.8 fL   EKG: Atrial  fibrillation, as in the past  [facility] reading (PRIMARY) by  Dr. Christell Constant: Chest x-ray- enlarged heart, right lower lung scar tissue                               All labs and EKG, and x-ray findings were reviewed with patient during this visit  Patient's sitting oxygen level on room air was 93% The oxygen level walking on room air was 87% Patient's walking with 2 L of O2 oxygen level was 95%      Assessment & Plan:    1. SOB (shortness of breath)   2. Questionable jaundice   3. Pallor   4. Atrial fibrillation   5. Acute on chronic combined systolic and diastolic CHF, NYHA class 3    Orders Placed This Encounter  Procedures  . DG Chest 2 View    Order Specific Question:  Reason for Exam (SYMPTOM  OR DIAGNOSIS REQUIRED)    Answer:  sob    Order Specific Question:  Preferred imaging location?    Answer:  Internal  . Hepatic function panel  . BASIC METABOLIC PANEL WITH GFR    Standing Status: Standing     Number of Occurrences: 4     Standing Expiration Date: 06/03/2014  . POCT CBC  . EKG 12-Lead   We will place an order for home oxygen based on patient's oxygen levels in the office He will be asked to use 2 L per minute at nighttime while sleeping We will do an ambulatory referral to the cardiologist that comes to [facility] since his cardiologist has retired   No orders of the defined types were placed in this encounter.   Patient Instructions  Use 02 as directed at 2 L per minute at night Monitor weight  regularly Watch sodium intake If problems with shortness of breath continue please get back in touch with [place] We will arrange a visit with the cardiologist that comes to [facility]

## 2013-06-03 NOTE — Addendum Note (Signed)
Addended by: Magdalene River on: 06/03/2013 05:22 PM   Modules accepted: Orders

## 2013-06-04 LAB — BASIC METABOLIC PANEL
CO2: 26 mmol/L (ref 18–29)
Calcium: 9 mg/dL (ref 8.6–10.2)
Chloride: 96 mmol/L — ABNORMAL LOW (ref 97–108)
Glucose: 122 mg/dL — ABNORMAL HIGH (ref 65–99)
Potassium: 3.6 mmol/L (ref 3.5–5.2)
Sodium: 141 mmol/L (ref 134–144)

## 2013-06-04 LAB — HEPATIC FUNCTION PANEL
Albumin: 4.3 g/dL (ref 3.5–4.7)
Total Bilirubin: 0.7 mg/dL (ref 0.0–1.2)
Total Protein: 6.8 g/dL (ref 6.0–8.5)

## 2013-06-04 LAB — AMBIG ABBREV BMP8 DEFAULT

## 2013-06-05 ENCOUNTER — Telehealth: Payer: Self-pay | Admitting: Critical Care Medicine

## 2013-06-05 NOTE — Telephone Encounter (Signed)
Spoke with the pt He states that he was seen by Dr Christell Constant and placed on abx based on cxr 06/03/13 He is overdue for rov with PW, and so I have scheduled appt for 06/24/13  Pt states nothing further needed

## 2013-06-07 ENCOUNTER — Emergency Department (HOSPITAL_COMMUNITY): Payer: Medicare Other

## 2013-06-07 ENCOUNTER — Emergency Department (HOSPITAL_COMMUNITY)
Admission: EM | Admit: 2013-06-07 | Discharge: 2013-06-07 | Disposition: A | Discharge status: Home / Self Care | Payer: Medicare Other | Attending: Emergency Medicine | Admitting: Emergency Medicine

## 2013-06-07 ENCOUNTER — Encounter (HOSPITAL_COMMUNITY): Payer: Self-pay | Admitting: Emergency Medicine

## 2013-06-07 DIAGNOSIS — Z7901 Long term (current) use of anticoagulants: Secondary | ICD-10-CM | POA: Insufficient documentation

## 2013-06-07 DIAGNOSIS — Z87891 Personal history of nicotine dependence: Secondary | ICD-10-CM | POA: Insufficient documentation

## 2013-06-07 DIAGNOSIS — J4489 Other specified chronic obstructive pulmonary disease: Secondary | ICD-10-CM | POA: Insufficient documentation

## 2013-06-07 DIAGNOSIS — I252 Old myocardial infarction: Secondary | ICD-10-CM | POA: Insufficient documentation

## 2013-06-07 DIAGNOSIS — I509 Heart failure, unspecified: Secondary | ICD-10-CM | POA: Insufficient documentation

## 2013-06-07 DIAGNOSIS — E785 Hyperlipidemia, unspecified: Secondary | ICD-10-CM | POA: Insufficient documentation

## 2013-06-07 DIAGNOSIS — Z951 Presence of aortocoronary bypass graft: Secondary | ICD-10-CM | POA: Insufficient documentation

## 2013-06-07 DIAGNOSIS — Z79899 Other long term (current) drug therapy: Secondary | ICD-10-CM | POA: Insufficient documentation

## 2013-06-07 DIAGNOSIS — E119 Type 2 diabetes mellitus without complications: Secondary | ICD-10-CM | POA: Insufficient documentation

## 2013-06-07 DIAGNOSIS — J449 Chronic obstructive pulmonary disease, unspecified: Secondary | ICD-10-CM | POA: Insufficient documentation

## 2013-06-07 DIAGNOSIS — I110 Hypertensive heart disease with heart failure: Secondary | ICD-10-CM | POA: Insufficient documentation

## 2013-06-07 DIAGNOSIS — M542 Cervicalgia: Secondary | ICD-10-CM | POA: Insufficient documentation

## 2013-06-07 DIAGNOSIS — M109 Gout, unspecified: Secondary | ICD-10-CM | POA: Insufficient documentation

## 2013-06-07 DIAGNOSIS — Z8719 Personal history of other diseases of the digestive system: Secondary | ICD-10-CM | POA: Insufficient documentation

## 2013-06-07 DIAGNOSIS — I2510 Atherosclerotic heart disease of native coronary artery without angina pectoris: Secondary | ICD-10-CM | POA: Insufficient documentation

## 2013-06-07 DIAGNOSIS — M549 Dorsalgia, unspecified: Secondary | ICD-10-CM | POA: Insufficient documentation

## 2013-06-07 DIAGNOSIS — I4891 Unspecified atrial fibrillation: Secondary | ICD-10-CM | POA: Insufficient documentation

## 2013-06-07 DIAGNOSIS — Z7982 Long term (current) use of aspirin: Secondary | ICD-10-CM | POA: Insufficient documentation

## 2013-06-07 LAB — POCT I-STAT TROPONIN I: Troponin i, poc: 0 ng/mL (ref 0.00–0.08)

## 2013-06-07 LAB — BASIC METABOLIC PANEL
BUN: 31 mg/dL — ABNORMAL HIGH (ref 6–23)
CO2: 30 mEq/L (ref 19–32)
Chloride: 100 mEq/L (ref 96–112)
Creatinine, Ser: 1.61 mg/dL — ABNORMAL HIGH (ref 0.50–1.35)
GFR calc Af Amer: 44 mL/min — ABNORMAL LOW (ref >90)
Glucose, Bld: 185 mg/dL — ABNORMAL HIGH (ref 70–99)
Potassium: 3.5 mEq/L (ref 3.5–5.1)

## 2013-06-07 LAB — CBC
HCT: 40.3 % (ref 39.0–52.0)
Hemoglobin: 14.3 g/dL (ref 13.0–17.0)
MCH: 31.4 pg (ref 26.0–34.0)
MCV: 88.4 fL (ref 78.0–100.0)
Platelets: 157 10*3/uL (ref 150–400)
RBC: 4.56 MIL/uL (ref 4.22–5.81)
WBC: 6.7 10*3/uL (ref 4.0–10.5)

## 2013-06-07 LAB — PROTIME-INR: INR: 1.6 — ABNORMAL HIGH (ref 0.00–1.49)

## 2013-06-07 MED ORDER — TRAMADOL HCL 50 MG PO TABS: 50.0000 mg | ORAL_TABLET | Freq: Once | ORAL | Status: DC

## 2013-06-07 MED ORDER — TRAMADOL HCL 50 MG PO TABS: 50.0000 mg | ORAL_TABLET | Freq: Four times a day (QID) | ORAL | Status: DC | PRN

## 2013-06-07 NOTE — ED Notes (Signed)
Pt states that he had some CP in the early am; however, pain seemed to travel to left side of neck and upper back.  CP resolved and pain seemed to be more musculoskeletal in nature than anything else.  Never had pain like this before.  No lifting or other physical activities noted.

## 2013-06-07 NOTE — ED Notes (Signed)
Pt reports recently diagnosed with pneumonia and started on antibiotics. Early this am, started having a "catch" to mid chest that radiates around to neck/upper back. Pain seems to occur with movement.

## 2013-06-07 NOTE — ED Provider Notes (Signed)
CSN: [id number]     Arrival date & time 06/07/13  1504 History   First MD Initiated Contact with Patient 06/07/13 1527     Chief Complaint  Patient presents with  . Chest Pain    HPI Patient had some upper chest pain last night when he turned over in the bed.  Pain finally went away when he got a comfortable position.  Patient had some back pain this morning.  He states the pain seems to be made worse when he moves certain ways.  Patient states the pain is not associated with shortness of breath, nausea, and diaphoresis, or vomiting. Past Medical History  Diagnosis Date  . HTN (hypertension)   . GERD (gastroesophageal reflux disease)   . Hyperlipidemia   . Hypertensive cardiovascular disease   . CAD (coronary artery disease)     hx of CABG, with graft dysfunction  . MI (myocardial infarction) 1989  . Atrial fibrillation 1/09    s/p DCCV to NSR -now chronic  . Pulmonary granuloma     left  . Ascending aortic aneurysm     5.6 in 2013, stable at that time  . COPD (chronic obstructive pulmonary disease)   . Shortness of breath   . Ischemic cardiomyopathy 10/02/2011    EF 25-30% on echo 5/13  . CHF (congestive heart failure)   . Hematuria   . Bleeding gastrointestinal   . Diabetes mellitus without complication   . Gout    Past Surgical History  Procedure Laterality Date  . Stomach surgery    . Mastoidectomy      right ear  . Cardiac catheterization  10/01/2011  . Coronary artery bypass graft  12/1994    LIMA-LAD, VG-diag; VG-PDA; occl. VG to PLA,occluded VG to LCX   . Pr vein bypass graft,aorto-fem-pop  1996  . Vagotomy  1973   Family History  Problem Relation Age of Onset  . Liver cancer Sister   . Hypertension Sister   . Cancer Sister   . Diabetes Sister   . Hypertension Father   . Heart disease Father   . Hyperlipidemia Father   . Heart disease Mother    History  Substance Use Topics  . Smoking status: Former Smoker -- 0.50 packs/day for 45 years    Types:  Cigarettes, Cigars    Start date: 08/28/1942    Quit date: 12/26/1987  . Smokeless tobacco: Never Used  . Alcohol Use: No     Comment: Occas Beer    Review of Systems All other systems reviewed and are negative Allergies  Ace inhibitors; Cimetidine; Ciprofloxacin; Nsaids; Zantac; and Percocet  Home Medications   Current Outpatient Rx  Name  Route  Sig  Dispense  Refill  . allopurinol (ZYLOPRIM) 300 MG tablet   Oral   Take 300 mg by mouth daily.         Marland Kitchen aspirin EC 81 MG EC tablet   Oral   Take 1 tablet (81 mg total) by mouth daily.         Marland Kitchen atorvastatin (LIPITOR) 80 MG tablet   Oral   Take 80 mg by mouth daily.         . carvedilol (COREG) 12.5 MG tablet   Oral   Take 1 tablet (12.5 mg total) by mouth 2 (two) times daily with a meal.   60 tablet   5   . cefUROXime (CEFTIN) 250 MG tablet   Oral   Take 1 tablet (250 mg total) by  mouth 2 (two) times daily.   14 tablet   0   . cholecalciferol (VITAMIN D) 1000 UNITS tablet   Oral   Take 1,000 Units by mouth daily.         . colchicine (COLCRYS) 0.6 MG tablet   Oral   Take 1 tablet (0.6 mg total) by mouth 2 (two) times daily as needed (for acute gout flare).   30 tablet   2   . furosemide (LASIX) 20 MG tablet   Oral   Take 3 tablets (60 mg total) by mouth 2 (two) times daily.   180 tablet   5   . isosorbide mononitrate (IMDUR) 15 mg TB24   Oral   Take 0.5 tablets (15 mg total) by mouth daily.   30 tablet   5   . LORazepam (ATIVAN) 0.5 MG tablet   Oral   Take 1 tablet (0.5 mg total) by mouth at bedtime as needed for anxiety (to help sleep). For anxiety   30 tablet   2   . metolazone (ZAROXOLYN) 2.5 MG tablet      Take one tablet half an hour prior to first lasix in the am for 3 days prn SOB   3 tablet   5   . Multiple Vitamins-Minerals (CENTRUM SILVER ADULT 50+) TABS   Oral   Take 1 tablet by mouth daily.         . nitroGLYCERIN (NITROSTAT) 0.4 MG SL tablet   Sublingual   Place 1  tablet (0.4 mg total) under the tongue every 5 (five) minutes x 3 doses as needed for chest pain.   25 tablet   5   . potassium chloride SA (K-DUR,KLOR-CON) 20 MEQ tablet   Oral   Take 20 mEq by mouth 2 (two) times daily.         Marland Kitchen terazosin (HYTRIN) 5 MG capsule   Oral   Take 1 capsule (5 mg total) by mouth at bedtime.   30 capsule   11   . traMADol (ULTRAM) 50 MG tablet   Oral   Take 1 tablet (50 mg total) by mouth every 6 (six) hours as needed for pain.   15 tablet   0   . warfarin (COUMADIN) 5 MG tablet      Take 1/2 to 1 tablet daily as directed by anticoagulation clinic   30 tablet   2    BP 142/66  Pulse 76  Temp(Src) 97.9 F (36.6 C) (Oral)  Resp 17  SpO2 97% Physical Exam  Nursing note and vitals reviewed. Constitutional: He is oriented to person, place, and time. He appears well-developed and well-nourished. No distress.  HENT:  Head: Normocephalic and atraumatic.  Eyes: Pupils are equal, round, and reactive to light.  Neck: Normal range of motion.  Cardiovascular: Normal rate and intact distal pulses.   Pulmonary/Chest: No respiratory distress.  Abdominal: Normal appearance. He exhibits no distension.  Musculoskeletal: Normal range of motion.       Arms: Neurological: He is alert and oriented to person, place, and time. No cranial nerve deficit.  Skin: Skin is warm and dry. No rash noted.  Psychiatric: He has a normal mood and affect. His behavior is normal.    ED Course  Procedures (including critical care time) Labs Review Labs Reviewed  BASIC METABOLIC PANEL - Abnormal; Notable for the following:    Glucose, Bld 185 (*)    BUN 31 (*)    Creatinine, Ser 1.61 (*)  GFR calc non Af Amer 38 (*)    GFR calc Af Amer 44 (*)    All other components within normal limits  PRO B NATRIURETIC PEPTIDE - Abnormal; Notable for the following:    Pro B Natriuretic peptide (BNP) 5708.0 (*)    All other components within normal limits  PROTIME-INR -  Abnormal; Notable for the following:    Prothrombin Time 18.6 (*)    INR 1.60 (*)    All other components within normal limits  CBC  POCT I-STAT TROPONIN I   Imaging Review No results found.  Date: 06/07/2013  Rate: 84  Rhythm: Atrial fibrillation  QRS Axis: normal  Intervals: normal  ST/T Wave abnormalities: ST-T wave abnormality  Conduction Disutrbances: none  Narrative Interpretation: Abnormal but unchanged EKG      MDM   1. Neck pain      Patient stated he had a pain similar to this in the past and took one Aleve for 5 days and it got better.    Nelia Shi, MD 06/11/13 [phone]

## 2013-06-23 ENCOUNTER — Ambulatory Visit (INDEPENDENT_AMBULATORY_CARE_PROVIDER_SITE_OTHER): Payer: Medicare Other | Admitting: Pharmacist

## 2013-06-23 DIAGNOSIS — I4891 Unspecified atrial fibrillation: Secondary | ICD-10-CM

## 2013-06-23 NOTE — Patient Instructions (Signed)
Anticoagulation Dose Instructions as of 06/23/2013     Glynis Smiles Tue Wed Thu Fri Sat   New Dose 2.5 mg 2.5 mg 5 mg 2.5 mg 2.5 mg 2.5 mg 2.5 mg    Description       Continue 1/2 tablet [= 2.5mg ] daily except on tuesdays take 1 tablet [= 5mg ]      INR was 2.6 today

## 2013-06-24 ENCOUNTER — Encounter: Payer: Self-pay | Admitting: Critical Care Medicine

## 2013-06-24 ENCOUNTER — Ambulatory Visit (INDEPENDENT_AMBULATORY_CARE_PROVIDER_SITE_OTHER): Payer: Medicare Other | Admitting: Critical Care Medicine

## 2013-06-24 VITALS — BP 140/84 | HR 66 | Temp 98.4°F | Ht 70.0 in | Wt 167.6 lb

## 2013-06-24 DIAGNOSIS — J849 Interstitial pulmonary disease, unspecified: Secondary | ICD-10-CM

## 2013-06-24 DIAGNOSIS — J8410 Pulmonary fibrosis, unspecified: Secondary | ICD-10-CM

## 2013-06-24 DIAGNOSIS — R05 Cough: Secondary | ICD-10-CM

## 2013-06-24 NOTE — Patient Instructions (Signed)
No change in medications. Return in      6 month

## 2013-06-24 NOTE — Progress Notes (Signed)
Subjective:    Patient ID: [name] [name], male    DOB: 1926/08/22, 79 y.o.   MRN: [id number]  HPI History of Present Illness: 79 y.o. male with upper airway instability syndrome and dyspnea, Previous on ACE inhibitor .  Past history is also pertinent in that patient was on mechanical ventilation for prolonged period time with associated tracheostomy placement with the patient's 1996 bypass surgery.  2012 >>ONO >O2 started for nocturnal desats  03/20/13 ONO -<5% desats>M[facility] will not cover o2.     04/01/13 Follow up  Pt returns for follow up and to discuss PFT  PFT today showed FEV1 76%, ratio 87 , no sign change s/p BD DLCO 46 % Had ONO done 7/24 with desats <5% of the time [facility] will not cover nocturnal O2.) .  CXR last ov  showed COPD and chronic interstitial changes at esp at bases  Last CT chest 03/2012 showed stable scarring and subpleural reticulation bilatterally .  He had no desats with ambulation at last ov  He says he is feeling better today with less dyspnea.  MAR review shows not pulmonary toxicity meds currently .   06/24/2013 Chief Complaint  Patient presents with  . Follow-up    Breathing improved since last OV.  Notes is much better compared before. Oxygen treatment at night. PCP showed desat with exertion.  No cough now .  DLCO low .  CT shows pulm fibrosis and copd changes,  Distant smoker No new issues.  Pt in hosp briefly several months ago. Pt denies any significant sore throat, nasal congestion or excess secretions, fever, chills, sweats, unintended weight loss, pleurtic or exertional chest pain, orthopnea PND, or leg swelling Pt denies any increase in rescue therapy over baseline, denies waking up needing it or having any early am or nocturnal exacerbations of coughing/wheezing/or dyspnea. Pt also denies any obvious fluctuation in symptoms with  weather or environmental change or other alleviating or aggravating factors     Review of Systems 11 pt Ros taken in  detail pos in hpi els neg    Objective:   Physical Exam Filed Vitals:   06/24/13 1457  BP: 140/84  Pulse: 66  Temp: 98.4 F (36.9 C)  TempSrc: Oral  Height: 5\' 10"  (1.778 m)  Weight: 167 lb 9.6 oz (76.023 kg)  SpO2: 98%    Gen: Pleasant, well-nourished, in no distress,  normal affect  ENT: No lesions,  mouth clear,  oropharynx clear, no postnasal drip  Neck: No JVD, no TMG, no carotid bruits  Lungs: No use of accessory muscles, no dullness to percussion,distant BS  Cardiovascular: RRR, heart sounds normal, no murmur or gallops, no  peripheral edema  Abdomen: soft and NT, no HSM,  BS normal  Musculoskeletal: No deformities, no cyanosis or clubbing  Neuro: alert, non focal  Skin: Warm, no lesions or rashes  No results found.     Assessment & Plan:   ILD (interstitial lung disease) Chronic cough with overnight desaturation and now chronic resp failure, Interstitial lung disease with peripheral reticulations Suspect gerd with micro aspiration playing a role, 22 pack year cigar smoker none since 1989 PFTs ok 03/2013 ONO pos for desaturation Plan Maintain oxygen therapy No specific therapy for ILD offered.    Updated Medication List Outpatient Encounter Prescriptions as of 06/24/2013  Medication Sig Dispense Refill  . aspirin EC 81 MG EC tablet Take 1 tablet (81 mg total) by mouth daily.      Marland Kitchen atorvastatin (LIPITOR) 80 MG  tablet Take 80 mg by mouth daily.      . carvedilol (COREG) 12.5 MG tablet Take 1 tablet (12.5 mg total) by mouth 2 (two) times daily with a meal.  60 tablet  5  . cholecalciferol (VITAMIN D) 1000 UNITS tablet Take 1,000 Units by mouth daily.      . colchicine (COLCRYS) 0.6 MG tablet Take 1 tablet (0.6 mg total) by mouth 2 (two) times daily as needed (for acute gout flare).  30 tablet  2  . furosemide (LASIX) 20 MG tablet Take 3 tablets (60 mg total) by mouth 2 (two) times daily.  180 tablet  5  . isosorbide mononitrate (IMDUR) 15 mg TB24 Take  0.5 tablets (15 mg total) by mouth daily.  30 tablet  5  . LORazepam (ATIVAN) 0.5 MG tablet Take 1 tablet (0.5 mg total) by mouth at bedtime as needed for anxiety (to help sleep). For anxiety  30 tablet  2  . Multiple Vitamins-Minerals (CENTRUM SILVER ADULT 50+) TABS Take 1 tablet by mouth daily.      . nitroGLYCERIN (NITROSTAT) 0.4 MG SL tablet Place 1 tablet (0.4 mg total) under the tongue every 5 (five) minutes x 3 doses as needed for chest pain.  25 tablet  5  . potassium chloride SA (K-DUR,KLOR-CON) 20 MEQ tablet Take 20 mEq by mouth 2 (two) times daily.      Marland Kitchen terazosin (HYTRIN) 5 MG capsule Take 1 capsule (5 mg total) by mouth at bedtime.  30 capsule  11  . traMADol (ULTRAM) 50 MG tablet Take 1 tablet (50 mg total) by mouth every 6 (six) hours as needed for pain.  15 tablet  0  . warfarin (COUMADIN) 5 MG tablet Take 1/2 to 1 tablet daily as directed by anticoagulation clinic  30 tablet  2  . [DISCONTINUED] metolazone (ZAROXOLYN) 2.5 MG tablet Take one tablet half an hour prior to first lasix in the am for 3 days prn SOB  3 tablet  5  . allopurinol (ZYLOPRIM) 300 MG tablet Take 300 mg by mouth daily.      . [DISCONTINUED] cefUROXime (CEFTIN) 250 MG tablet Take 1 tablet (250 mg total) by mouth 2 (two) times daily.  14 tablet  0   No facility-administered encounter medications on file as of 06/24/2013.

## 2013-06-26 NOTE — Assessment & Plan Note (Addendum)
Chronic cough with overnight desaturation and now chronic resp failure, Interstitial lung disease with peripheral reticulations Suspect gerd with micro aspiration playing a role, 22 pack year cigar smoker none since 1989 PFTs ok 03/2013 ONO pos for desaturation Plan Maintain oxygen therapy No specific therapy for ILD offered.

## 2013-06-27 ENCOUNTER — Telehealth: Payer: Self-pay | Admitting: Cardiovascular Disease

## 2013-06-27 ENCOUNTER — Other Ambulatory Visit: Payer: Self-pay | Admitting: *Deleted

## 2013-06-27 MED ORDER — CARVEDILOL 12.5 MG PO TABS: 12.5000 mg | ORAL_TABLET | Freq: Two times a day (BID) | ORAL | Status: AC

## 2013-06-27 MED ORDER — CARVEDILOL 12.5 MG PO TABS: 12.5000 mg | ORAL_TABLET | Freq: Two times a day (BID) | ORAL | Status: DC

## 2013-06-27 NOTE — Telephone Encounter (Signed)
Please call-need to talk to you about his medicine.He wants to talk to J C

## 2013-06-27 NOTE — Telephone Encounter (Signed)
Handled by Kem Boroughs

## 2013-06-27 NOTE — Progress Notes (Signed)
Med refilled coreg 12.5 bid #90 [id number]

## 2013-07-04 ENCOUNTER — Other Ambulatory Visit: Payer: Self-pay

## 2013-07-04 NOTE — Telephone Encounter (Signed)
Last seen 06/03/13  DWM   If approved route to nurse to call into [facility]

## 2013-07-05 MED ORDER — LORAZEPAM 0.5 MG PO TABS: 0.5000 mg | ORAL_TABLET | Freq: Every evening | ORAL | Status: AC | PRN

## 2013-07-05 NOTE — Telephone Encounter (Signed)
This is okay to refill 

## 2013-07-07 NOTE — Telephone Encounter (Signed)
Med called to pharm 

## 2013-07-18 ENCOUNTER — Other Ambulatory Visit: Payer: Self-pay | Admitting: *Deleted

## 2013-07-18 MED ORDER — FUROSEMIDE 20 MG PO TABS: 60.0000 mg | ORAL_TABLET | Freq: Two times a day (BID) | ORAL | Status: AC

## 2013-07-18 MED ORDER — FUROSEMIDE 20 MG PO TABS: 60.0000 mg | ORAL_TABLET | Freq: Two times a day (BID) | ORAL | Status: DC

## 2013-07-28 ENCOUNTER — Ambulatory Visit (INDEPENDENT_AMBULATORY_CARE_PROVIDER_SITE_OTHER): Payer: Medicare Other | Admitting: Pharmacist

## 2013-07-28 DIAGNOSIS — I4891 Unspecified atrial fibrillation: Secondary | ICD-10-CM

## 2013-07-28 LAB — POCT INR: INR: 3.2

## 2013-07-28 NOTE — Patient Instructions (Signed)
Anticoagulation Dose Instructions as of 07/28/2013     [name] [name] Tue Wed Thu Fri Sat   New Dose 2.5 mg 2.5 mg 2.5 mg 2.5 mg 2.5 mg 2.5 mg 2.5 mg    Description       No warfarin tomorrow, then decrease to 1/2 tablet [= 2.5mg ] daily.      INR was 3.2 today

## 2013-08-13 ENCOUNTER — Ambulatory Visit (INDEPENDENT_AMBULATORY_CARE_PROVIDER_SITE_OTHER): Payer: Medicare Other | Admitting: Family Medicine

## 2013-08-13 ENCOUNTER — Ambulatory Visit (INDEPENDENT_AMBULATORY_CARE_PROVIDER_SITE_OTHER): Payer: Medicare Other | Admitting: Pharmacist

## 2013-08-13 ENCOUNTER — Encounter: Payer: Self-pay | Admitting: Family Medicine

## 2013-08-13 VITALS — BP 131/72 | HR 79 | Temp 97.0°F | Ht 70.0 in | Wt 166.0 lb

## 2013-08-13 DIAGNOSIS — N058 Unspecified nephritic syndrome with other morphologic changes: Secondary | ICD-10-CM

## 2013-08-13 DIAGNOSIS — I4891 Unspecified atrial fibrillation: Secondary | ICD-10-CM

## 2013-08-13 DIAGNOSIS — E785 Hyperlipidemia, unspecified: Secondary | ICD-10-CM

## 2013-08-13 DIAGNOSIS — IMO0002 Reserved for concepts with insufficient information to code with codable children: Secondary | ICD-10-CM

## 2013-08-13 DIAGNOSIS — K219 Gastro-esophageal reflux disease without esophagitis: Secondary | ICD-10-CM

## 2013-08-13 DIAGNOSIS — N183 Chronic kidney disease, stage 3 unspecified: Secondary | ICD-10-CM

## 2013-08-13 DIAGNOSIS — E559 Vitamin D deficiency, unspecified: Secondary | ICD-10-CM

## 2013-08-13 DIAGNOSIS — Z23 Encounter for immunization: Secondary | ICD-10-CM

## 2013-08-13 DIAGNOSIS — I10 Essential (primary) hypertension: Secondary | ICD-10-CM

## 2013-08-13 DIAGNOSIS — E1129 Type 2 diabetes mellitus with other diabetic kidney complication: Secondary | ICD-10-CM

## 2013-08-13 LAB — POCT INR: INR: 2.2

## 2013-08-13 NOTE — Patient Instructions (Signed)
Anticoagulation Dose Instructions as of 08/13/2013     [name] [name] Tue Wed Thu Fri Sat   New Dose 2.5 mg 2.5 mg 2.5 mg 2.5 mg 2.5 mg 2.5 mg 2.5 mg    Description       Continue 1/2 tablet [= 2.5mg ] daily      INR was 2.2 today

## 2013-08-13 NOTE — Addendum Note (Signed)
Addended by: Magdalene River on: 08/13/2013 04:55 PM   Modules accepted: Orders

## 2013-08-13 NOTE — Progress Notes (Signed)
Subjective:    Patient ID: [name] [name], male    DOB: [birth year], 79 y.o.   MRN: [id number]  HPI Pt here for follow up and management of chronic medical problems. The patient indicates he has episodes of shortness of breath.  These episodes are not any more frequent or any more severe than usual. He also complains of upper back and neck pain worse when he gets up in the morning and that this gets better with more activity during the day. The patient is to do a rectal exam today he will get traditional lab work done today and he will get a Prevnar vaccination today. The patient denies chest pain. He indicates that the use of O2 on an as-needed basis has helped him some. He did see the pulmonologist in the past couple of months. No additional treatments were ordered.      Patient Active Problem List   Diagnosis Date Noted  . DM type 2, uncontrolled, with renal complications 01/06/2013  . Chronic combined systolic and diastolic heart failure, NYHA class 3 11/07/2012  . Chronic total occlusion of artery of the extremities 12/27/2011  . AAA (abdominal aortic aneurysm) 5.6cm by CT 7/12 12/09/2011  . Stage 3 chronic renal impairment associated with type 2 diabetes mellitus 12/09/2011  . Ventricular tachycardia, non-sustained; secondary to Ischemic CM 10/04/2011    Class: Diagnosis of  . Chronic renal disease, stage III 10/04/2011    Class: Diagnosis of  . Ischemic cardiomyopathy, EF 20-25% by 2D Feb 2012 10/02/2011  . Chronic anticoagulation, secondary to Chronic a. fib. 09/30/2011  . GERD (gastroesophageal reflux disease) 03/15/2011  . Stricture esophagus 03/15/2011  . Gastritis, chronic 03/15/2011  . Gout 12/21/2007  . PEPTIC STRICTURE 12/21/2007  .  CABG 1999, Cath [facility] 2013 with patent grafts and distal disease -refused life vest and ICD 11/15/2007  . Atrial fibrillation, chronic- permanent 11/15/2007  . ILD (interstitial lung disease) 11/15/2007  . HYPERLIPIDEMIA 11/13/2007  .  HYPERTENSION 11/13/2007  . HYPERTENSIVE CARDIOVASCULAR DISEASE 11/13/2007   Outpatient Encounter Prescriptions as of 08/13/2013  Medication Sig  . allopurinol (ZYLOPRIM) 300 MG tablet Take 300 mg by mouth daily.  Marland Kitchen aspirin EC 81 MG EC tablet Take 1 tablet (81 mg total) by mouth daily.  Marland Kitchen atorvastatin (LIPITOR) 80 MG tablet Take 80 mg by mouth daily.  . carvedilol (COREG) 12.5 MG tablet Take 1 tablet (12.5 mg total) by mouth 2 (two) times daily.  . cholecalciferol (VITAMIN D) 1000 UNITS tablet Take 1,000 Units by mouth daily.  . colchicine (COLCRYS) 0.6 MG tablet Take 1 tablet (0.6 mg total) by mouth 2 (two) times daily as needed (for acute gout flare).  . furosemide (LASIX) 20 MG tablet Take 3 tablets (60 mg total) by mouth 2 (two) times daily.  . isosorbide mononitrate (IMDUR) 15 mg TB24 Take 0.5 tablets (15 mg total) by mouth daily.  Marland Kitchen LORazepam (ATIVAN) 0.5 MG tablet Take 1 tablet (0.5 mg total) by mouth at bedtime as needed for anxiety (to help sleep). For anxiety  . Multiple Vitamins-Minerals (CENTRUM SILVER ADULT 50+) TABS Take 1 tablet by mouth daily.  . nitroGLYCERIN (NITROSTAT) 0.4 MG SL tablet Place 1 tablet (0.4 mg total) under the tongue every 5 (five) minutes x 3 doses as needed for chest pain.  . potassium chloride SA (K-DUR,KLOR-CON) 20 MEQ tablet Take 20 mEq by mouth 2 (two) times daily.  Marland Kitchen terazosin (HYTRIN) 5 MG capsule Take 1 capsule (5 mg total) by mouth at bedtime.  Marland Kitchen  traMADol (ULTRAM) 50 MG tablet Take 1 tablet (50 mg total) by mouth every 6 (six) hours as needed for pain.  Marland Kitchen warfarin (COUMADIN) 5 MG tablet Take 1/2 to 1 tablet daily as directed by anticoagulation clinic    Review of Systems  Constitutional: Negative.   HENT: Negative.   Eyes: Negative.   Respiratory: Negative.   Cardiovascular: Negative.   Gastrointestinal: Negative.   Endocrine: Negative.   Genitourinary: Negative.   Musculoskeletal: Positive for arthralgias.  Skin: Negative.     Allergic/Immunologic: Negative.   Neurological: Negative.   Hematological: Negative.   Psychiatric/Behavioral: Negative.        Objective:   Physical Exam  Nursing note and vitals reviewed. Constitutional: He is oriented to person, place, and time. He appears well-developed and well-nourished. No distress.  For his age  HENT:  Head: Normocephalic and atraumatic.  Right Ear: External ear normal.  Left Ear: External ear normal.  Nose: Nose normal.  Mouth/Throat: Oropharynx is clear and moist. No oropharyngeal exudate.  Eyes: Conjunctivae and EOM are normal. Pupils are equal, round, and reactive to light. Right eye exhibits no discharge. Left eye exhibits no discharge. No scleral icterus.  Neck: Normal range of motion. Neck supple. No thyromegaly present.  No carotid bruits were heard  Cardiovascular: Normal rate, normal heart sounds and intact distal pulses.  Exam reveals no gallop and no friction rub.   No murmur heard. At 72 per minute irregular rhythm  Pulmonary/Chest: Effort normal and breath sounds normal. No respiratory distress. He has no wheezes. He has no rales. He exhibits no tenderness.  Abdominal: Soft. Bowel sounds are normal. He exhibits no mass. There is no tenderness. There is no rebound and no guarding.  Genitourinary: Rectum normal and penis normal.  Prostate is very enlarged and there are no rectal masses. The external genitalia were normal. There was no inguinal hernia or inguinal nodes.  Musculoskeletal: Normal range of motion. He exhibits no edema and no tenderness.  Lymphadenopathy:    He has no cervical adenopathy.  Neurological: He is alert and oriented to person, place, and time. He has normal reflexes. No cranial nerve deficit.  Skin: Skin is warm and dry. No rash noted. No erythema. No pallor.  Psychiatric: He has a normal mood and affect. His behavior is normal. Judgment and thought content normal.  This patient is very alert and very with it for his age  and his multiple medical conditions. He is calm and pleasant and seems to be stable at this point in time.    BP 131/72  Pulse 79  Temp(Src) 97 F (36.1 C) (Oral)  Ht 5\' 10"  (1.778 m)  Wt 166 lb (75.297 kg)  BMI 23.82 kg/m2       Assessment & Plan:  1. Atrial fibrillation, chronic- permanent -cardiology followup will be arranged  2. Chronic renal disease, stage III - POCT CBC  3. DM type 2,  4. GERD (gastroesophageal reflux disease) - POCT CBC - Hepatic function panel  5. HYPERTENSION - BMP8+EGFR - Hepatic function panel  6. HYPERLIPIDEMIA - Lipid panel  7. Vitamin D deficiency - Vit D  25 hydroxy (rtn osteoporosis monitoring)  No orders of the defined types were placed in this encounter.   Patient Instructions  Continue current medications. Continue good therapeutic lifestyle changes which include good diet and exercise. Fall precautions discussed with patient. Schedule your flu vaccine if you haven't had it yet If you are over 74 years old - you may need  Prevnar 13 or the adult Pneumonia vaccine. The Prevnar shot he received today may make your arm somewhat sore Try to keep the house as cool as possible this winter Always keep yourself well hydrated We will once again try to arrange a visit with the cardiologist for followup since your previous cardiologist has retired   Nyra Capes MD

## 2013-08-13 NOTE — Patient Instructions (Addendum)
Continue current medications. Continue good therapeutic lifestyle changes which include good diet and exercise. Fall precautions discussed with patient. Schedule your flu vaccine if you haven't had it yet If you are over 77 years old - you may need Prevnar 13 or the adult Pneumonia vaccine. The Prevnar shot he received today may make your arm somewhat sore Try to keep the house as cool as possible this winter Always keep yourself well hydrated We will once again try to arrange a visit with the cardiologist for followup since your previous cardiologist has retired

## 2013-08-14 LAB — BMP8+EGFR
BUN: 53 mg/dL — ABNORMAL HIGH (ref 8–27)
CO2: 30 mmol/L — ABNORMAL HIGH (ref 18–29)
Calcium: 9.2 mg/dL (ref 8.6–10.2)
Creatinine, Ser: 2.15 mg/dL — ABNORMAL HIGH (ref 0.76–1.27)
GFR calc Af Amer: 31 mL/min/{1.73_m2} — ABNORMAL LOW (ref >59)
GFR calc non Af Amer: 27 mL/min/{1.73_m2} — ABNORMAL LOW (ref >59)
Glucose: 126 mg/dL — ABNORMAL HIGH (ref 65–99)
Sodium: 141 mmol/L (ref 134–144)

## 2013-08-14 LAB — POCT CBC
Granulocyte percent: 70.9 %G (ref 37–80)
HCT, POC: 41.8 % — AB (ref 43.5–53.7)
Hemoglobin: 14 g/dL — AB (ref 14.1–18.1)
MCH, POC: 30.8 pg (ref 27–31.2)
MCHC: 33.6 g/dL (ref 31.8–35.4)
MPV: 8.3 fL (ref 0–99.8)
POC LYMPH PERCENT: 26.3 %L (ref 10–50)
RBC: 4.6 M/uL — AB (ref 4.69–6.13)

## 2013-08-14 LAB — HEPATIC FUNCTION PANEL
ALT: 9 IU/L (ref 0–44)
Albumin: 4.5 g/dL (ref 3.5–4.7)
Alkaline Phosphatase: 68 IU/L (ref 39–117)
Bilirubin, Direct: 0.26 mg/dL (ref 0.00–0.40)
Total Bilirubin: 0.9 mg/dL (ref 0.0–1.2)
Total Protein: 7 g/dL (ref 6.0–8.5)

## 2013-08-14 LAB — LIPID PANEL
Chol/HDL Ratio: 3.8 ratio units (ref 0.0–5.0)
LDL Calculated: 118 mg/dL — ABNORMAL HIGH (ref 0–99)
VLDL Cholesterol Cal: 25 mg/dL (ref 5–40)

## 2013-08-14 LAB — VITAMIN D 25 HYDROXY (VIT D DEFICIENCY, FRACTURES): Vit D, 25-Hydroxy: 26.4 ng/mL — ABNORMAL LOW (ref 30.0–100.0)

## 2013-08-25 ENCOUNTER — Telehealth: Payer: Self-pay | Admitting: Family Medicine

## 2013-08-25 NOTE — Telephone Encounter (Signed)
Pt notified and aware of labs 

## 2013-08-27 ENCOUNTER — Other Ambulatory Visit: Payer: Self-pay | Admitting: Family Medicine

## 2013-08-27 ENCOUNTER — Telehealth: Payer: Self-pay | Admitting: Family Medicine

## 2013-08-27 MED ORDER — WARFARIN SODIUM 5 MG PO TABS: ORAL_TABLET | ORAL | Status: DC

## 2013-08-27 NOTE — Telephone Encounter (Signed)
Refill was set to phone in instead of normal. Med refill information left on [facility]

## 2013-09-03 ENCOUNTER — Other Ambulatory Visit (INDEPENDENT_AMBULATORY_CARE_PROVIDER_SITE_OTHER): Payer: Medicare Other

## 2013-09-03 DIAGNOSIS — R17 Unspecified jaundice: Secondary | ICD-10-CM

## 2013-09-03 LAB — BASIC METABOLIC PANEL WITH GFR
BUN: 52 mg/dL — AB (ref 6–23)
CHLORIDE: 97 meq/L (ref 96–112)
CO2: 33 meq/L — AB (ref 19–32)
CREATININE: 2.06 mg/dL — AB (ref 0.50–1.35)
Calcium: 9.6 mg/dL (ref 8.4–10.5)
GFR, Est African American: 33 mL/min — ABNORMAL LOW
GFR, Est Non African American: 29 mL/min — ABNORMAL LOW
GLUCOSE: 129 mg/dL — AB (ref 70–99)
POTASSIUM: 3.6 meq/L (ref 3.5–5.3)
Sodium: 141 mEq/L (ref 135–145)

## 2013-09-09 ENCOUNTER — Telehealth: Payer: Self-pay | Admitting: Family Medicine

## 2013-09-09 NOTE — Telephone Encounter (Signed)
Patient did have his INR checked in the lab so he will need to keep his regular protime appointment

## 2013-09-11 ENCOUNTER — Telehealth: Payer: Self-pay | Admitting: Family Medicine

## 2013-09-11 NOTE — Telephone Encounter (Signed)
Please get home health to visit patient and do protimes at home because patient is homebound and he has congestive heart failure and atrial fibrillation

## 2013-09-16 NOTE — Telephone Encounter (Signed)
Must have a skilled need, protimes alone are not covered. Looking into Home PT/INR for pt

## 2013-09-17 NOTE — Telephone Encounter (Signed)
Patient is going to come in for INR tomorrow as planned but will have [name] CorkGina [name] send request to see if his insurance will cover MDINR checks at home.

## 2013-09-17 NOTE — Telephone Encounter (Signed)
Tammy do you think Mr. Loleta ChanceHill would be a good candidate for Home PT/INR?

## 2013-09-18 ENCOUNTER — Ambulatory Visit (INDEPENDENT_AMBULATORY_CARE_PROVIDER_SITE_OTHER): Payer: Medicare Other | Admitting: Pharmacist

## 2013-09-18 DIAGNOSIS — I4891 Unspecified atrial fibrillation: Secondary | ICD-10-CM

## 2013-09-18 LAB — POCT INR: INR: 4

## 2013-09-18 NOTE — Patient Instructions (Addendum)
Anticoagulation Dose Instructions as of 09/18/2013     [name] SmilesSun Mon Tue Wed Thu Fri Sat   New Dose 2.5 mg 2.5 mg 2.5 mg 2.5 mg 2.5 mg Hold 2.5 mg    Description       No warfarin tomorrow 09/19/13 or Saturday 09/20/13. Then decrease to 1/2 tablet daily except none on Fridays.

## 2013-10-01 ENCOUNTER — Encounter: Payer: Self-pay | Admitting: Cardiology

## 2013-10-01 ENCOUNTER — Ambulatory Visit (INDEPENDENT_AMBULATORY_CARE_PROVIDER_SITE_OTHER): Payer: Medicare Other | Admitting: Pharmacist

## 2013-10-01 ENCOUNTER — Ambulatory Visit (INDEPENDENT_AMBULATORY_CARE_PROVIDER_SITE_OTHER): Payer: Medicare Other | Admitting: Cardiology

## 2013-10-01 VITALS — BP 109/52 | HR 51 | Ht 71.0 in | Wt 168.0 lb

## 2013-10-01 DIAGNOSIS — I4891 Unspecified atrial fibrillation: Secondary | ICD-10-CM

## 2013-10-01 LAB — POCT INR: INR: 2.7

## 2013-10-01 NOTE — Progress Notes (Signed)
HPI The patient has a complicated cardiac history. He was seen for years by Dr. Alanda AmassWeintraub.  He has an ischemic cardiomyopathy. He has baseline dyspnea and progressive weakness with his advancing age. He is on oxygen at night and when necessary. He's had no dramatic change in his complaints. He does get short of breath with any activity. He gets around in a wheelchair if he has to go any distance. He's not had any new chest pressure, neck or arm discomfort. He doesn't notice his atrial fibrillation has had no presyncope or syncope. He doesn't have any new PND or orthopnea, weight gain or edema.  I did review his most recent catheterization done in 2013 in the most recent hospitalization in May 2014 when he had some volume overload.  Allergies  Allergen Reactions  . Ace Inhibitors     Cough  . Cimetidine      unknown reaction, pt states per Dr. Jarold MottoPatterson  . Ciprofloxacin Other (See Comments)    unknown  . Nsaids     Dr does not want patient to take due to kidneys  . Zantac [Ranitidine Hcl]     Hepatitis per Dr. Jarold MottoPatterson  . Percocet [Oxycodone-Acetaminophen] Other (See Comments)    hallucinations    Current Outpatient Prescriptions  Medication Sig Dispense Refill  . carvedilol (COREG) 12.5 MG tablet Take 1 tablet (12.5 mg total) by mouth 2 (two) times daily.  180 tablet  3  . furosemide (LASIX) 20 MG tablet Take 3 tablets (60 mg total) by mouth 2 (two) times daily.  180 tablet  5  . LORazepam (ATIVAN) 0.5 MG tablet Take 1 tablet (0.5 mg total) by mouth at bedtime as needed for anxiety (to help sleep). For anxiety  30 tablet  2  . nitroGLYCERIN (NITROSTAT) 0.4 MG SL tablet Place 1 tablet (0.4 mg total) under the tongue every 5 (five) minutes x 3 doses as needed for chest pain.  25 tablet  5  . terazosin (HYTRIN) 5 MG capsule Take 1 capsule (5 mg total) by mouth at bedtime.  30 capsule  11  . traMADol (ULTRAM) 50 MG tablet Take 1 tablet (50 mg total) by mouth every 6 (six) hours as needed  for pain.  15 tablet  0  . warfarin (COUMADIN) 5 MG tablet Take 1/2 to 1 tablet daily as directed by anticoagulation clinic  30 tablet  2   No current facility-administered medications for this visit.    Past Medical History  Diagnosis Date  . HTN (hypertension)   . GERD (gastroesophageal reflux disease)   . Hyperlipidemia   . Hypertensive cardiovascular disease   . CAD (coronary artery disease)     hx of CABG, with graft dysfunction  . MI (myocardial infarction) 1989  . Atrial fibrillation 1/09    s/p DCCV to NSR -now chronic  . Pulmonary granuloma     left  . Ascending aortic aneurysm     5.6 in 2013, stable at that time  . COPD (chronic obstructive pulmonary disease)   . Shortness of breath   . Ischemic cardiomyopathy 10/02/2011    EF 25-30% on echo 5/13  . CHF (congestive heart failure)   . Hematuria   . Bleeding gastrointestinal   . Diabetes mellitus without complication   . Gout     Past Surgical History  Procedure Laterality Date  . Stomach surgery    . Mastoidectomy      right ear  . Cardiac catheterization  10/01/2011  .  Coronary artery bypass graft  12/1994    LIMA-LAD, VG-diag; VG-PDA; occl. VG to PLA,occluded VG to LCX   . Pr vein bypass graft,aorto-fem-pop  1996  . Vagotomy  1973    Family History  Problem Relation Age of Onset  . Liver cancer Sister   . Hypertension Sister   . Cancer Sister   . Diabetes Sister   . Hypertension Father   . Heart disease Father   . Hyperlipidemia Father   . Heart disease Mother     History   Social History  . Marital Status: Widowed    Spouse Name: N/A    Number of Children: 4  . Years of Education: N/A   Occupational History  . 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     Social History Main Topics  . Smoking status: Former Smoker -- 0.50 packs/day for 45 years    Types: Cigarettes, Cigars    Start date: 08/28/1942    Quit date: 12/26/1987  . Smokeless tobacco: Never Used  . Alcohol Use: No     Comment: Occas Beer   . Drug Use: No  . Sexual Activity: Not on file   Other Topics Concern  . Not on file   Social History Narrative  . No narrative on file    ROS:  As stated in the HPI and negative for all other systems.  PHYSICAL EXAM BP 109/52  Pulse 51  Ht 5\' 11"  (1.803 m)  Wt 168 lb (76.204 kg)  BMI 23.44 kg/m2 PHYSICAL EXAM GEN:  No distress, frail. NECK:  No jugular venous distention at 90 degrees, waveform within normal limits, carotid upstroke brisk and symmetric, no bruits, no thyromegaly LYMPHATICS:  No cervical adenopathy LUNGS:  Clear to auscultation bilaterally BACK:  No CVA tenderness CHEST:  Unremarkable HEART:  S1 and S2 within normal limits, no S3, no clicks, no rubs, no murmurs, distant heart sounds.  ABD:  Positive bowel sounds normal in frequency in pitch, no bruits, no rebound, no guarding, unable to assess midline mass or bruit with the patient seated. EXT:  2 plus pulses throughout, moderate edema, no cyanosis no clubbing SKIN:  No rashes no nodules NEURO:  Cranial nerves II through XII grossly intact, motor grossly intact throughout, diffuse muscle wasting PSYCH:  Cognitively intact, oriented to person place and time    EKG:  Atrial fibrillation, rate 78, left bundle branch block, left axis deviation.  10/01/2013  ASSESSMENT AND PLAN  CAD/CABG:  He has no acute anginal symptoms and he will continue with medical management.  HTN:  His blood pressure is well controlled. He will continue the meds as listed.  ATRIAL FIB:  He tolerates anticoagulation and has no interest in switching to a NOAC.   ISCHEMIC CARDIOMYOPATHY:  He has refused a defibrillator.  He seems to be euvolemic now and we will continue the meds as listed. We've reviewed salt and fluid restriction.  ASCENDING AORTIC DILATATION:    I reviewed the notes from Dr. Tyrone Sage.  The patient would not want any surgical intervention on this will reduce surgeries. He does however want to follow with Dr. Tyrone Sage.

## 2013-10-01 NOTE — Patient Instructions (Signed)
The current medical regimen is effective;  continue present plan and medications.  Follow up in 4 months with Dr Hochrein 

## 2013-10-01 NOTE — Patient Instructions (Signed)
Anticoagulation Dose Instructions as of 10/01/2013     [name] SmilesSun Mon Tue Wed Thu Fri Sat   New Dose 2.5 mg 2.5 mg 2.5 mg 2.5 mg 2.5 mg Hold 2.5 mg    Description       Continue to take warfarin 1/2 tablet daily except none on Fridays.      INR was 2.7 today

## 2013-10-06 ENCOUNTER — Telehealth: Payer: Self-pay | Admitting: *Deleted

## 2013-10-06 DIAGNOSIS — R350 Frequency of micturition: Secondary | ICD-10-CM

## 2013-10-06 NOTE — Telephone Encounter (Signed)
See if someone can bring a clean catch midstream specimen for urinalysis and culture and sensitivity, freshly voided.. I am sure that his son, Aurther Lofterry, would bring a urine specimen to the office

## 2013-10-06 NOTE — Telephone Encounter (Signed)
Patient stated he can not get to the office and everytime he urinates it hurts he states it is like the urine is backed up and doesn't want to come through please advise. I tried to get patient to come in and he said he can not get here

## 2013-10-07 ENCOUNTER — Telehealth: Payer: Self-pay | Admitting: Family Medicine

## 2013-10-07 ENCOUNTER — Other Ambulatory Visit (INDEPENDENT_AMBULATORY_CARE_PROVIDER_SITE_OTHER): Payer: Medicare Other

## 2013-10-07 DIAGNOSIS — R350 Frequency of micturition: Secondary | ICD-10-CM

## 2013-10-07 LAB — POCT UA - MICROSCOPIC ONLY
Bacteria, U Microscopic: NEGATIVE
Casts, Ur, LPF, POC: NEGATIVE
Crystals, Ur, HPF, POC: NEGATIVE
MUCUS UA: NEGATIVE
WBC, UR, HPF, POC: NEGATIVE
Yeast, UA: NEGATIVE

## 2013-10-07 LAB — POCT URINALYSIS DIPSTICK
BILIRUBIN UA: NEGATIVE
GLUCOSE UA: NEGATIVE
KETONES UA: NEGATIVE
Leukocytes, UA: NEGATIVE
Nitrite, UA: NEGATIVE
Protein, UA: NEGATIVE
Urobilinogen, UA: NEGATIVE
pH, UA: 6

## 2013-10-07 NOTE — Telephone Encounter (Signed)
Spoke with Mr. Loleta ChanceHill and he is going to have someone to bring a urine specimen and I went ahead and placed the order for them

## 2013-10-07 NOTE — Progress Notes (Signed)
Pt dropped off urine only 

## 2013-10-08 ENCOUNTER — Encounter (HOSPITAL_COMMUNITY): Payer: Self-pay | Admitting: Emergency Medicine

## 2013-10-08 ENCOUNTER — Emergency Department (HOSPITAL_COMMUNITY)
Admission: EM | Admit: 2013-10-08 | Discharge: 2013-10-08 | Disposition: A | Discharge status: Home / Self Care | Payer: Medicare Other | Attending: Emergency Medicine | Admitting: Emergency Medicine

## 2013-10-08 DIAGNOSIS — Z951 Presence of aortocoronary bypass graft: Secondary | ICD-10-CM | POA: Insufficient documentation

## 2013-10-08 DIAGNOSIS — K5900 Constipation, unspecified: Secondary | ICD-10-CM | POA: Insufficient documentation

## 2013-10-08 DIAGNOSIS — R300 Dysuria: Secondary | ICD-10-CM | POA: Insufficient documentation

## 2013-10-08 DIAGNOSIS — E119 Type 2 diabetes mellitus without complications: Secondary | ICD-10-CM | POA: Insufficient documentation

## 2013-10-08 DIAGNOSIS — Z9889 Other specified postprocedural states: Secondary | ICD-10-CM | POA: Insufficient documentation

## 2013-10-08 DIAGNOSIS — I252 Old myocardial infarction: Secondary | ICD-10-CM | POA: Insufficient documentation

## 2013-10-08 DIAGNOSIS — Z7901 Long term (current) use of anticoagulants: Secondary | ICD-10-CM | POA: Insufficient documentation

## 2013-10-08 DIAGNOSIS — I2510 Atherosclerotic heart disease of native coronary artery without angina pectoris: Secondary | ICD-10-CM | POA: Insufficient documentation

## 2013-10-08 DIAGNOSIS — Z87448 Personal history of other diseases of urinary system: Secondary | ICD-10-CM | POA: Insufficient documentation

## 2013-10-08 DIAGNOSIS — J4489 Other specified chronic obstructive pulmonary disease: Secondary | ICD-10-CM | POA: Insufficient documentation

## 2013-10-08 DIAGNOSIS — I10 Essential (primary) hypertension: Secondary | ICD-10-CM | POA: Insufficient documentation

## 2013-10-08 DIAGNOSIS — Z8639 Personal history of other endocrine, nutritional and metabolic disease: Secondary | ICD-10-CM | POA: Insufficient documentation

## 2013-10-08 DIAGNOSIS — Z862 Personal history of diseases of the blood and blood-forming organs and certain disorders involving the immune mechanism: Secondary | ICD-10-CM | POA: Insufficient documentation

## 2013-10-08 DIAGNOSIS — Z87891 Personal history of nicotine dependence: Secondary | ICD-10-CM | POA: Insufficient documentation

## 2013-10-08 DIAGNOSIS — I4891 Unspecified atrial fibrillation: Secondary | ICD-10-CM | POA: Insufficient documentation

## 2013-10-08 DIAGNOSIS — J449 Chronic obstructive pulmonary disease, unspecified: Secondary | ICD-10-CM | POA: Insufficient documentation

## 2013-10-08 DIAGNOSIS — Z79899 Other long term (current) drug therapy: Secondary | ICD-10-CM | POA: Insufficient documentation

## 2013-10-08 LAB — OCCULT BLOOD, POC DEVICE: Fecal Occult Bld: POSITIVE — AB

## 2013-10-08 MED ORDER — FLEET ENEMA 7-19 GM/118ML RE ENEM
1.0000 | ENEMA | Freq: Once | RECTAL | Status: AC
  Administered 2013-10-08: 17:00:00 via RECTAL
  Filled 2013-10-08: qty 1

## 2013-10-08 MED ORDER — DOCUSATE SODIUM 100 MG PO CAPS: 100.0000 mg | ORAL_CAPSULE | Freq: Two times a day (BID) | ORAL | Status: AC

## 2013-10-08 MED ORDER — MAGNESIUM CITRATE PO SOLN
1.0000 | Freq: Once | ORAL | Status: AC
  Administered 2013-10-08: 1 via ORAL
  Filled 2013-10-08: qty 296

## 2013-10-08 NOTE — ED Notes (Signed)
Initial contact - pt resting on stretcher, c/o constipation x1 week.  Pt reports feeling pain in rectum while urinating and when attempting to have BM.  Otherwise pt denies pain.  Pt denies n/v/d.  Abd s/nt/nd, +bs x4 quads, hypoactive.  Pt reports eating/drinking well over the last week.  Skin PWD.  MAEI.  Speaking full/clear sentences, rr even/un-lab.  Pt denies other complaints.  NAD.

## 2013-10-08 NOTE — ED Provider Notes (Signed)
CSN: [id number]     Arrival date & time 10/08/13  1558 History   First MD Initiated Contact with Patient 10/08/13 1606     Chief Complaint  Patient presents with  . Constipation     (Consider location/radiation/quality/duration/timing/severity/associated sxs/prior Treatment) HPI  80 y.o. Male complaining of no bowel movement for a week. After he was unable to have a bowel movement for 1-2 days, he had pain with urination.  Patient feels like he starts to have bowel movement then has pain and unable to stool.  He has had some episodes of constipation in the past but has not had to come to ed or seek medical intervention for same.  He states he took metamucil once yesterday as his only intervention.  He has been taking po without difficulty.  PMD is Dr. Christell Constant in [place] and he took urine sample in there yesterday which was normal by chart review with exception of rbc 1-5.  Patient states his other medical problems seem about baseline to him.  He has some dyspnea but feels it is controlled with oxygen.  He has been eating ok.    Past Medical History  Diagnosis Date  . HTN (hypertension)   . GERD (gastroesophageal reflux disease)   . Hyperlipidemia   . CAD (coronary artery disease)     hx of CABG.  2013 with patent LIMA, and SVG to the diagonal patent the sequential limb to the ramus intermediate occluded, SVG to right coronary artery occluded, SVG to PDA open the sequential limb to posterolateral line and posterior lateral to occluded.  . MI (myocardial infarction) 1989  . Atrial fibrillation 1/09    s/p DCCV to NSR -now chronic  . Pulmonary granuloma     left  . Ascending aortic aneurysm     5.6 in 2013, stable at that time  . COPD (chronic obstructive pulmonary disease)   . Ischemic cardiomyopathy 10/02/2011    EF 25-30% on echo 5/13  . Hematuria   . Bleeding gastrointestinal   . Diabetes mellitus without complication   . Gout    Past Surgical History  Procedure Laterality Date  .  Stomach surgery      PUD  . Mastoidectomy      right ear  . Cardiac catheterization  10/01/2011  . Coronary artery bypass graft  12/1994    LIMA-LAD, VG-diag; VG-PDA; occl. VG to PLA,occluded VG to LCX   . Pr vein bypass graft,aorto-fem-pop  1996  . Vagotomy  1973   Family History  Problem Relation Age of Onset  . Liver cancer Sister   . Hypertension Sister   . Cancer Sister   . Diabetes Sister   . Hypertension Father   . Heart disease Father   . Hyperlipidemia Father   . Heart disease Mother    History  Substance Use Topics  . Smoking status: Former Smoker -- 0.50 packs/day for 45 years    Types: Cigarettes, Cigars    Start date: 08/28/1942    Quit date: 12/26/1987  . Smokeless tobacco: Never Used  . Alcohol Use: No     Comment: Occas Beer    Review of Systems  All other systems reviewed and are negative.      Allergies  Ace inhibitors; Cimetidine; Ciprofloxacin; Nsaids; Zantac; and Percocet  Home Medications   Current Outpatient Rx  Name  Route  Sig  Dispense  Refill  . carvedilol (COREG) 12.5 MG tablet   Oral   Take 1 tablet (12.5 mg  total) by mouth 2 (two) times daily.   180 tablet   3   . furosemide (LASIX) 20 MG tablet   Oral   Take 3 tablets (60 mg total) by mouth 2 (two) times daily.   180 tablet   5   . LORazepam (ATIVAN) 0.5 MG tablet   Oral   Take 1 tablet (0.5 mg total) by mouth at bedtime as needed for anxiety (to help sleep). For anxiety   30 tablet   2   . nitroGLYCERIN (NITROSTAT) 0.4 MG SL tablet   Sublingual   Place 1 tablet (0.4 mg total) under the tongue every 5 (five) minutes x 3 doses as needed for chest pain.   25 tablet   5   . terazosin (HYTRIN) 5 MG capsule   Oral   Take 1 capsule (5 mg total) by mouth at bedtime.   30 capsule   11   . traMADol (ULTRAM) 50 MG tablet   Oral   Take 1 tablet (50 mg total) by mouth every 6 (six) hours as needed for pain.   15 tablet   0   . warfarin (COUMADIN) 5 MG tablet   Oral    Take 2.5 mg by mouth daily. 2.5mg   M,Tu,W,Th,Sa,Su          BP 106/59  Pulse 74  Temp(Src) 98.7 F (37.1 C) (Oral)  Resp 14  SpO2 95% Physical Exam  Nursing note and vitals reviewed. Constitutional: He is oriented to person, place, and time. He appears well-developed.  HENT:  Head: Normocephalic and atraumatic.  Right Ear: External ear normal.  Left Ear: External ear normal.  Nose: Nose normal.  Mouth/Throat: Oropharynx is clear and moist.  Eyes: Conjunctivae and EOM are normal. Pupils are equal, round, and reactive to light.  Neck: Normal range of motion. Neck supple.  Cardiovascular: Normal rate.  An irregularly irregular rhythm present.  Pulmonary/Chest: Effort normal and breath sounds normal.  Abdominal: Soft. Bowel sounds are normal.  Genitourinary: Prostate normal. No penile tenderness.  Soft stool in rectum  Musculoskeletal: Normal range of motion. He exhibits no edema and no tenderness.  Neurological: He is alert and oriented to person, place, and time. He has normal reflexes.  Skin: Skin is warm and dry.  Psychiatric: He has a normal mood and affect. His behavior is normal. Judgment and thought content normal.    ED Course  Procedures  EKG Interpretation   None       MDM   Final diagnoses:  None   Discussed treatment options with patient and family.  Patient with soft, moderate amount of stool in rectum and likely able to pass on own at home.   Patient given fleets and mag citrate here and digital disimpaction attempted and moderate amount of stool removed but some remains in rectal vault.  Patient advised regarding stool softener and need for follow up.  There is some slight external bleeding with digital manipulation and stool positive but this appears to be secondary to manipulation with patient on coumadin.  No active gross bleeding and stool is brown.       Hilario Quarryanielle S Marla Pouliot, MD 10/08/13 2007

## 2013-10-08 NOTE — Discharge Instructions (Signed)
Constipation, Adult  Constipation is when a person:  · Poops (bowel movement) less than 3 times a week.  · Has a hard time pooping.  · Has poop that is dry, hard, or bigger than normal.  HOME CARE   · Eat more fiber, such as fruits, vegetables, whole grains like brown rice, and beans.  · Eat less fatty foods and sugar. This includes French fries, hamburgers, cookies, candy, and soda.  · If you are not getting enough fiber from food, take products with added fiber in them (supplements).  · Drink enough fluid to keep your pee (urine) clear or pale yellow.  · Go to the restroom when you feel like you need to poop. Do not hold it.  · Only take medicine as told by your doctor. Do not take medicines that help you poop (laxatives) without talking to your doctor first.  · Exercise on a regular basis, or as told by your doctor.  GET HELP RIGHT AWAY IF:   · You have bright red blood in your poop (stool).  · Your constipation lasts more than 4 days or gets worse.  · You have belly (abdomen) or butt (rectal) pain.  · You have thin poop (as thin as a pencil).  · You lose weight, and it cannot be explained.  MAKE SURE YOU:   · Understand these instructions.  · Will watch your condition.  · Will get help right away if you are not doing well or get worse.  Document Released: 01/31/2008 Document Revised: 11/06/2011 Document Reviewed: 05/26/2013  [employer]® Patient Information ©2014 [employer].

## 2013-10-08 NOTE — ED Notes (Signed)
Pt produced small amt soft, brown stool after fleet. EDP aware.

## 2013-10-08 NOTE — ED Notes (Signed)
Bed: [id number] Expected date:  Expected time:  Means of arrival:  Comments: constipation

## 2013-10-08 NOTE — ED Notes (Signed)
Pt presents with NAD- Per [facility] pt reports LBM 1 weeks ago. Spoke with PCP yesterday. Took urine sample by yesterday and was awaiting results. Did not hear from PCP today and decided to come in for evaluation. Has rectal pain with urination. Denies N/V and fever

## 2013-10-10 NOTE — Telephone Encounter (Signed)
PAtient aware 

## 2013-10-24 ENCOUNTER — Telehealth: Payer: Self-pay | Admitting: Family Medicine

## 2013-10-25 ENCOUNTER — Emergency Department (HOSPITAL_COMMUNITY): Payer: Medicare Other

## 2013-10-25 ENCOUNTER — Inpatient Hospital Stay (HOSPITAL_COMMUNITY)
Admission: EM | Admit: 2013-10-25 | Discharge: 2013-11-26 | DRG: 194 | Disposition: E | Payer: Medicare Other | Attending: Internal Medicine | Admitting: Internal Medicine

## 2013-10-25 ENCOUNTER — Encounter (HOSPITAL_COMMUNITY): Payer: Self-pay | Admitting: Emergency Medicine

## 2013-10-25 DIAGNOSIS — R531 Weakness: Secondary | ICD-10-CM

## 2013-10-25 DIAGNOSIS — I712 Thoracic aortic aneurysm, without rupture, unspecified: Secondary | ICD-10-CM | POA: Diagnosis present

## 2013-10-25 DIAGNOSIS — I714 Abdominal aortic aneurysm, without rupture, unspecified: Secondary | ICD-10-CM | POA: Diagnosis present

## 2013-10-25 DIAGNOSIS — B1920 Unspecified viral hepatitis C without hepatic coma: Secondary | ICD-10-CM | POA: Diagnosis present

## 2013-10-25 DIAGNOSIS — K2950 Unspecified chronic gastritis without bleeding: Secondary | ICD-10-CM | POA: Diagnosis present

## 2013-10-25 DIAGNOSIS — I10 Essential (primary) hypertension: Secondary | ICD-10-CM | POA: Diagnosis present

## 2013-10-25 DIAGNOSIS — M1A9XX0 Chronic gout, unspecified, without tophus (tophi): Secondary | ICD-10-CM | POA: Diagnosis present

## 2013-10-25 DIAGNOSIS — N184 Chronic kidney disease, stage 4 (severe): Secondary | ICD-10-CM | POA: Diagnosis present

## 2013-10-25 DIAGNOSIS — Z66 Do not resuscitate: Secondary | ICD-10-CM | POA: Diagnosis present

## 2013-10-25 DIAGNOSIS — N183 Chronic kidney disease, stage 3 unspecified: Secondary | ICD-10-CM

## 2013-10-25 DIAGNOSIS — Z87891 Personal history of nicotine dependence: Secondary | ICD-10-CM

## 2013-10-25 DIAGNOSIS — E1165 Type 2 diabetes mellitus with hyperglycemia: Secondary | ICD-10-CM

## 2013-10-25 DIAGNOSIS — I447 Left bundle-branch block, unspecified: Secondary | ICD-10-CM | POA: Diagnosis present

## 2013-10-25 DIAGNOSIS — Z951 Presence of aortocoronary bypass graft: Secondary | ICD-10-CM

## 2013-10-25 DIAGNOSIS — J189 Pneumonia, unspecified organism: Principal | ICD-10-CM | POA: Diagnosis present

## 2013-10-25 DIAGNOSIS — Z800 Family history of malignant neoplasm of digestive organs: Secondary | ICD-10-CM

## 2013-10-25 DIAGNOSIS — I252 Old myocardial infarction: Secondary | ICD-10-CM

## 2013-10-25 DIAGNOSIS — I119 Hypertensive heart disease without heart failure: Secondary | ICD-10-CM | POA: Diagnosis present

## 2013-10-25 DIAGNOSIS — I472 Ventricular tachycardia: Secondary | ICD-10-CM | POA: Diagnosis present

## 2013-10-25 DIAGNOSIS — R791 Abnormal coagulation profile: Secondary | ICD-10-CM

## 2013-10-25 DIAGNOSIS — I7092 Chronic total occlusion of artery of the extremities: Secondary | ICD-10-CM | POA: Diagnosis present

## 2013-10-25 DIAGNOSIS — R5381 Other malaise: Secondary | ICD-10-CM

## 2013-10-25 DIAGNOSIS — J849 Interstitial pulmonary disease, unspecified: Secondary | ICD-10-CM

## 2013-10-25 DIAGNOSIS — R9389 Abnormal findings on diagnostic imaging of other specified body structures: Secondary | ICD-10-CM

## 2013-10-25 DIAGNOSIS — Z833 Family history of diabetes mellitus: Secondary | ICD-10-CM

## 2013-10-25 DIAGNOSIS — I2589 Other forms of chronic ischemic heart disease: Secondary | ICD-10-CM | POA: Diagnosis present

## 2013-10-25 DIAGNOSIS — I2510 Atherosclerotic heart disease of native coronary artery without angina pectoris: Secondary | ICD-10-CM

## 2013-10-25 DIAGNOSIS — E1122 Type 2 diabetes mellitus with diabetic chronic kidney disease: Secondary | ICD-10-CM | POA: Diagnosis present

## 2013-10-25 DIAGNOSIS — K219 Gastro-esophageal reflux disease without esophagitis: Secondary | ICD-10-CM | POA: Diagnosis present

## 2013-10-25 DIAGNOSIS — I5042 Chronic combined systolic (congestive) and diastolic (congestive) heart failure: Secondary | ICD-10-CM

## 2013-10-25 DIAGNOSIS — Z9981 Dependence on supplemental oxygen: Secondary | ICD-10-CM

## 2013-10-25 DIAGNOSIS — E1129 Type 2 diabetes mellitus with other diabetic kidney complication: Secondary | ICD-10-CM

## 2013-10-25 DIAGNOSIS — Z889 Allergy status to unspecified drugs, medicaments and biological substances status: Secondary | ICD-10-CM

## 2013-10-25 DIAGNOSIS — J4489 Other specified chronic obstructive pulmonary disease: Secondary | ICD-10-CM | POA: Diagnosis present

## 2013-10-25 DIAGNOSIS — Z8249 Family history of ischemic heart disease and other diseases of the circulatory system: Secondary | ICD-10-CM

## 2013-10-25 DIAGNOSIS — I739 Peripheral vascular disease, unspecified: Secondary | ICD-10-CM | POA: Diagnosis present

## 2013-10-25 DIAGNOSIS — R5383 Other fatigue: Secondary | ICD-10-CM

## 2013-10-25 DIAGNOSIS — I129 Hypertensive chronic kidney disease with stage 1 through stage 4 chronic kidney disease, or unspecified chronic kidney disease: Secondary | ICD-10-CM | POA: Diagnosis present

## 2013-10-25 DIAGNOSIS — E119 Type 2 diabetes mellitus without complications: Secondary | ICD-10-CM | POA: Diagnosis present

## 2013-10-25 DIAGNOSIS — Z515 Encounter for palliative care: Secondary | ICD-10-CM

## 2013-10-25 DIAGNOSIS — J8410 Pulmonary fibrosis, unspecified: Secondary | ICD-10-CM | POA: Diagnosis present

## 2013-10-25 DIAGNOSIS — I509 Heart failure, unspecified: Secondary | ICD-10-CM | POA: Diagnosis present

## 2013-10-25 DIAGNOSIS — J449 Chronic obstructive pulmonary disease, unspecified: Secondary | ICD-10-CM | POA: Diagnosis present

## 2013-10-25 DIAGNOSIS — Z7901 Long term (current) use of anticoagulants: Secondary | ICD-10-CM

## 2013-10-25 DIAGNOSIS — N179 Acute kidney failure, unspecified: Secondary | ICD-10-CM | POA: Diagnosis present

## 2013-10-25 DIAGNOSIS — I255 Ischemic cardiomyopathy: Secondary | ICD-10-CM | POA: Diagnosis present

## 2013-10-25 DIAGNOSIS — Z79899 Other long term (current) drug therapy: Secondary | ICD-10-CM

## 2013-10-25 DIAGNOSIS — E785 Hyperlipidemia, unspecified: Secondary | ICD-10-CM | POA: Diagnosis present

## 2013-10-25 DIAGNOSIS — K222 Esophageal obstruction: Secondary | ICD-10-CM | POA: Diagnosis present

## 2013-10-25 DIAGNOSIS — N390 Urinary tract infection, site not specified: Secondary | ICD-10-CM | POA: Diagnosis present

## 2013-10-25 DIAGNOSIS — IMO0002 Reserved for concepts with insufficient information to code with codable children: Secondary | ICD-10-CM

## 2013-10-25 DIAGNOSIS — M109 Gout, unspecified: Secondary | ICD-10-CM | POA: Diagnosis present

## 2013-10-25 DIAGNOSIS — J40 Bronchitis, not specified as acute or chronic: Secondary | ICD-10-CM

## 2013-10-25 DIAGNOSIS — I469 Cardiac arrest, cause unspecified: Secondary | ICD-10-CM | POA: Diagnosis not present

## 2013-10-25 DIAGNOSIS — M1A00X0 Idiopathic chronic gout, unspecified site, without tophus (tophi): Secondary | ICD-10-CM | POA: Diagnosis present

## 2013-10-25 DIAGNOSIS — I4891 Unspecified atrial fibrillation: Secondary | ICD-10-CM

## 2013-10-25 DIAGNOSIS — I4729 Other ventricular tachycardia: Secondary | ICD-10-CM | POA: Diagnosis present

## 2013-10-25 HISTORY — DX: Unspecified viral hepatitis C without hepatic coma: B19.20

## 2013-10-25 LAB — COMPREHENSIVE METABOLIC PANEL
ALT: 11 U/L (ref 0–53)
AST: 18 U/L (ref 0–37)
Albumin: 2.6 g/dL — ABNORMAL LOW (ref 3.5–5.2)
Alkaline Phosphatase: 57 U/L (ref 39–117)
BUN: 54 mg/dL — ABNORMAL HIGH (ref 6–23)
CHLORIDE: 92 meq/L — AB (ref 96–112)
CO2: 30 meq/L (ref 19–32)
CREATININE: 1.91 mg/dL — AB (ref 0.50–1.35)
Calcium: 8.7 mg/dL (ref 8.4–10.5)
GFR calc Af Amer: 35 mL/min — ABNORMAL LOW (ref >90)
GFR calc non Af Amer: 30 mL/min — ABNORMAL LOW (ref >90)
GLUCOSE: 123 mg/dL — AB (ref 70–99)
POTASSIUM: 3.8 meq/L (ref 3.7–5.3)
Sodium: 137 mEq/L (ref 137–147)
Total Bilirubin: 1.4 mg/dL — ABNORMAL HIGH (ref 0.3–1.2)
Total Protein: 6.8 g/dL (ref 6.0–8.3)

## 2013-10-25 LAB — CBC WITH DIFFERENTIAL/PLATELET
Basophils Absolute: 0 10*3/uL (ref 0.0–0.1)
Basophils Relative: 0 % (ref 0–1)
Eosinophils Absolute: 0.1 10*3/uL (ref 0.0–0.7)
Eosinophils Relative: 1 % (ref 0–5)
HCT: 38.5 % — ABNORMAL LOW (ref 39.0–52.0)
HEMOGLOBIN: 13 g/dL (ref 13.0–17.0)
LYMPHS ABS: 1.5 10*3/uL (ref 0.7–4.0)
Lymphocytes Relative: 16 % (ref 12–46)
MCH: 30.9 pg (ref 26.0–34.0)
MCHC: 33.8 g/dL (ref 30.0–36.0)
MCV: 91.4 fL (ref 78.0–100.0)
Monocytes Absolute: 1.1 10*3/uL — ABNORMAL HIGH (ref 0.1–1.0)
Monocytes Relative: 12 % (ref 3–12)
NEUTROS ABS: 6.7 10*3/uL (ref 1.7–7.7)
NEUTROS PCT: 71 % (ref 43–77)
Platelets: 148 10*3/uL — ABNORMAL LOW (ref 150–400)
RBC: 4.21 MIL/uL — AB (ref 4.22–5.81)
RDW: 13.6 % (ref 11.5–15.5)
WBC: 9.4 10*3/uL (ref 4.0–10.5)

## 2013-10-25 LAB — URINE MICROSCOPIC-ADD ON

## 2013-10-25 LAB — LACTIC ACID, PLASMA: Lactic Acid, Venous: 1.9 mmol/L (ref 0.5–2.2)

## 2013-10-25 LAB — URINALYSIS, ROUTINE W REFLEX MICROSCOPIC
Bilirubin Urine: NEGATIVE
GLUCOSE, UA: NEGATIVE mg/dL
Ketones, ur: NEGATIVE mg/dL
LEUKOCYTES UA: NEGATIVE
Nitrite: NEGATIVE
Protein, ur: NEGATIVE mg/dL
SPECIFIC GRAVITY, URINE: 1.018 (ref 1.005–1.030)
UROBILINOGEN UA: 1 mg/dL (ref 0.0–1.0)
pH: 5 (ref 5.0–8.0)

## 2013-10-25 LAB — URIC ACID: Uric Acid, Serum: 13 mg/dL — ABNORMAL HIGH (ref 4.0–7.8)

## 2013-10-25 LAB — STREP PNEUMONIAE URINARY ANTIGEN: STREP PNEUMO URINARY ANTIGEN: NEGATIVE

## 2013-10-25 LAB — PROTIME-INR
INR: 5.04 — AB (ref 0.00–1.49)
PROTHROMBIN TIME: 44.7 s — AB (ref 11.6–15.2)

## 2013-10-25 LAB — POC OCCULT BLOOD, ED: Fecal Occult Bld: NEGATIVE

## 2013-10-25 MED ORDER — DOXYCYCLINE HYCLATE 100 MG IV SOLR
100.0000 mg | Freq: Two times a day (BID) | INTRAVENOUS | Status: DC
  Administered 2013-10-25 – 2013-10-26 (×2): 100 mg via INTRAVENOUS
  Filled 2013-10-25 (×3): qty 100

## 2013-10-25 MED ORDER — LORAZEPAM 0.5 MG PO TABS
0.5000 mg | ORAL_TABLET | Freq: Every evening | ORAL | Status: DC | PRN
  Administered 2013-10-25: 0.5 mg via ORAL
  Filled 2013-10-25: qty 1

## 2013-10-25 MED ORDER — WARFARIN - PHARMACIST DOSING INPATIENT: Freq: Every day | Status: DC

## 2013-10-25 MED ORDER — NITROGLYCERIN 0.4 MG SL SUBL: 0.4000 mg | SUBLINGUAL_TABLET | SUBLINGUAL | Status: DC | PRN

## 2013-10-25 MED ORDER — POLYETHYLENE GLYCOL 3350 17 G PO PACK: 17.0000 g | PACK | Freq: Every day | ORAL | Status: DC | PRN

## 2013-10-25 MED ORDER — ONDANSETRON HCL 4 MG/2ML IJ SOLN
4.0000 mg | Freq: Four times a day (QID) | INTRAMUSCULAR | Status: DC | PRN
  Administered 2013-10-25: 4 mg via INTRAVENOUS
  Filled 2013-10-25: qty 2

## 2013-10-25 MED ORDER — HYDROCODONE-ACETAMINOPHEN 5-325 MG PO TABS: 1.0000 | ORAL_TABLET | ORAL | Status: DC | PRN

## 2013-10-25 MED ORDER — CARVEDILOL 12.5 MG PO TABS
12.5000 mg | ORAL_TABLET | Freq: Two times a day (BID) | ORAL | Status: DC
  Administered 2013-10-25 – 2013-10-26 (×2): 12.5 mg via ORAL
  Filled 2013-10-25 (×5): qty 1

## 2013-10-25 MED ORDER — SODIUM CHLORIDE 0.9 % IV SOLN: INTRAVENOUS | Status: DC

## 2013-10-25 MED ORDER — ALUM & MAG HYDROXIDE-SIMETH 200-200-20 MG/5ML PO SUSP: 30.0000 mL | Freq: Four times a day (QID) | ORAL | Status: DC | PRN

## 2013-10-25 MED ORDER — PHYTONADIONE 5 MG PO TABS
5.0000 mg | ORAL_TABLET | Freq: Once | ORAL | Status: DC
  Filled 2013-10-25: qty 1

## 2013-10-25 MED ORDER — ONDANSETRON HCL 4 MG PO TABS: 4.0000 mg | ORAL_TABLET | Freq: Four times a day (QID) | ORAL | Status: DC | PRN

## 2013-10-25 MED ORDER — ALBUTEROL SULFATE (2.5 MG/3ML) 0.083% IN NEBU
2.5000 mg | INHALATION_SOLUTION | Freq: Four times a day (QID) | RESPIRATORY_TRACT | Status: AC | PRN
  Administered 2013-10-26: 2.5 mg via RESPIRATORY_TRACT
  Filled 2013-10-25: qty 3

## 2013-10-25 MED ORDER — SODIUM CHLORIDE 0.9 % IV SOLN
INTRAVENOUS | Status: DC
  Administered 2013-10-26: 08:00:00 via INTRAVENOUS

## 2013-10-25 MED ORDER — SODIUM CHLORIDE 0.9 % IV SOLN
INTRAVENOUS | Status: DC
  Administered 2013-10-25: 13:00:00 via INTRAVENOUS

## 2013-10-25 MED ORDER — COLCHICINE 0.6 MG PO TABS
0.6000 mg | ORAL_TABLET | Freq: Two times a day (BID) | ORAL | Status: DC
  Administered 2013-10-25 – 2013-10-26 (×2): 0.6 mg via ORAL
  Filled 2013-10-25 (×5): qty 1

## 2013-10-25 MED ORDER — DOCUSATE SODIUM 100 MG PO CAPS
100.0000 mg | ORAL_CAPSULE | Freq: Two times a day (BID) | ORAL | Status: DC
  Administered 2013-10-25 – 2013-10-26 (×3): 100 mg via ORAL
  Filled 2013-10-25 (×3): qty 1

## 2013-10-25 MED ORDER — GUAIFENESIN-DM 100-10 MG/5ML PO SYRP: 5.0000 mL | ORAL_SOLUTION | ORAL | Status: DC | PRN

## 2013-10-25 NOTE — ED Notes (Signed)
[facility]. Pt was dx with UTI 5 weeks ago and had finished his ABT. Family thinks that pt could be septic d/t increased weakness. Family tried to get in touch with PCP but was unable to. No fevers or chills. Pt stated that he is urinating more often but doesn't have a lot of output. Denies burning with urination.

## 2013-10-25 NOTE — ED Notes (Signed)
MD at bedside. 

## 2013-10-25 NOTE — ED Notes (Signed)
Pt remains in radiology 

## 2013-10-25 NOTE — ED Notes (Signed)
Admitting MD at bedside.

## 2013-10-25 NOTE — ED Notes (Signed)
PANIC LEVEL INR called in from lab of 5.04  Dr. Radford PaxBeaton made aware.

## 2013-10-25 NOTE — ED Notes (Signed)
Patient transported to CT 

## 2013-10-25 NOTE — H&P (Signed)
Patient Demographics  [name] [name], is a 80 y.o. male  MRN: [id number]   DOB - 1925-12-03  Admit Date - 29-Oct-2013  Outpatient Primary MD for the patient is [name], MD   With History of -  Past Medical History  Diagnosis Date  . HTN (hypertension)   . GERD (gastroesophageal reflux disease)   . Hyperlipidemia   . CAD (coronary artery disease)     hx of CABG.  2013 with patent LIMA, and SVG to the diagonal patent the sequential limb to the ramus intermediate occluded, SVG to right coronary artery occluded, SVG to PDA open the sequential limb to posterolateral line and posterior lateral to occluded.  . MI (myocardial infarction) 1989  . Atrial fibrillation 1/09    s/p DCCV to NSR -now chronic  . Pulmonary granuloma     left  . Ascending aortic aneurysm     5.6 in 2013, stable at that time  . COPD (chronic obstructive pulmonary disease)   . Ischemic cardiomyopathy 10/02/2011    EF 25-30% on echo 5/13  . Hematuria   . Bleeding gastrointestinal   . Diabetes mellitus without complication   . Gout       Past Surgical History  Procedure Laterality Date  . Stomach surgery      PUD  . Mastoidectomy      right ear  . Cardiac catheterization  10/01/2011  . Coronary artery bypass graft  12/1994    LIMA-LAD, VG-diag; VG-PDA; occl. VG to PLA,occluded VG to LCX   . Pr vein bypass graft,aorto-fem-pop  1996  . Vagotomy  1973    in for   Chief Complaint  Patient presents with  . Urinary Tract Infection     HPI  [name] [name]  is a 80 y.o. male, with H/O CAD -CABG, Isch Cardiomyopathy with Chr combined diastolic and Systolic CHF EF 20% on 2 L nasal cannula oxygen, interstitial lung disease, Afib on Coumadin, CKD 4, Gout, DM-2, AAA, Es.Stricture, PAD - who lives at home and has been developing gradually  progressive generalized weakness over the last 2-3 weeks, for the last 1 week has been bedbound, he has also been experiencing mild productive cough with increasing shortness of breath, denies any headache, no fever chills, no chest pain or palpitations, no abdominal pain, no diarrhea no dysuria, he has gout with generalized joint pains and aches on on-and-off basis, no focal weakness.   Was brought in by family members to the ER for generalized weakness and deconditioning, in the ER workup consisted of a early community-acquired pneumonia, acute to chronic gout with uric acid of over 12, patient unable to get out of the bed, I was called to admit the patient.    Review of Systems    In addition to the HPI above,   No Fever-chills, No Headache, No changes with Vision or hearing, No problems swallowing food or Liquids, No Chest pain, +ve Cough & Shortness of  Breath, No Abdominal pain, No Nausea or Vommitting, Bowel movements are regular, No Blood in stool or Urine, No dysuria, No new skin rashes or bruises, Diffuse joint pains No new weakness, tingling, numbness in any extremity, +ve Gen weakness No recent weight gain or loss, No polyuria, polydypsia or polyphagia, No significant Mental Stressors.  A full 10 point Review of Systems was done, except as stated above, all other Review of Systems were negative.   Social History History  Substance Use Topics  . Smoking status: Former Smoker -- 0.50 packs/day for 45 years    Types: Cigarettes, Cigars    Start date: 08/28/1942    Quit date: 12/26/1987  . Smokeless tobacco: Never Used  . Alcohol Use: No     Comment: Occas Beer      Family History Family History  Problem Relation Age of Onset  . Liver cancer Sister   . Hypertension Sister   . Cancer Sister   . Diabetes Sister   . Hypertension Father   . Heart disease Father   . Hyperlipidemia Father   . Heart disease Mother       Prior to Admission medications     Medication Sig Start Date End Date Taking? Authorizing Provider  acetaminophen (TYLENOL) 500 MG tablet Take 1,000 mg by mouth every 6 (six) hours as needed for moderate pain.   Yes Historical Provider, MD  carvedilol (COREG) 12.5 MG tablet Take 1 tablet (12.5 mg total) by mouth 2 (two) times daily. 06/27/13  Yes Lennette Bihari, MD  cilostazol (PLETAL) 50 MG tablet Take 50 mg by mouth 2 (two) times daily.   Yes Historical Provider, MD  docusate sodium (COLACE) 100 MG capsule Take 1 capsule (100 mg total) by mouth every 12 (twelve) hours. 10/08/13  Yes Hilario Quarry, MD  furosemide (LASIX) 20 MG tablet Take 3 tablets (60 mg total) by mouth 2 (two) times daily. 07/18/13  Yes Runell Gess, MD  LORazepam (ATIVAN) 0.5 MG tablet Take 1 tablet (0.5 mg total) by mouth at bedtime as needed for anxiety (to help sleep). For anxiety 07/04/13  Yes Ernestina Penna, MD  terazosin (HYTRIN) 5 MG capsule Take 1 capsule (5 mg total) by mouth at bedtime. 04/29/13  Yes Governor Rooks, MD  warfarin (COUMADIN) 5 MG tablet Take 2.5 mg by mouth daily. 2.5mg   M,Tu,W,Th,Sa,Su   Yes Historical Provider, MD  nitroGLYCERIN (NITROSTAT) 0.4 MG SL tablet Place 1 tablet (0.4 mg total) under the tongue every 5 (five) minutes x 3 doses as needed for chest pain. 01/01/13   Brittainy Simmons, PA-C    Allergies  Allergen Reactions  . Ace Inhibitors     Cough  . Cimetidine      unknown reaction, pt states per Dr. Jarold Motto  . Ciprofloxacin Other (See Comments)    unknown  . Nsaids     Dr does not want patient to take due to kidneys  . Zantac [Ranitidine Hcl]     Hepatitis per Dr. Jarold Motto  . Percocet [Oxycodone-Acetaminophen] Other (See Comments)    hallucinations    Physical Exam  Vitals  Blood pressure 117/66, pulse 80, temperature 98.1 F (36.7 C), temperature source Rectal, resp. rate 15, SpO2 100.00%.   1. General old frail white male lying in bed in NAD,     2. Normal affect and insight, Not Suicidal or  Homicidal, Awake Alert, Oriented X 3.  3. No F.N deficits, ALL C.Nerves Intact, Strength 5/5 all 4 extremities, Sensation  intact all 4 extremities, Plantars down going. Generalized weakness.  4. Ears and Eyes appear Normal, Conjunctivae clear, PERRLA. Moist Oral Mucosa.  5. Supple Neck, No JVD, No cervical lymphadenopathy appriciated, No Carotid Bruits.  6. Symmetrical Chest wall movement, Good air movement bilaterally, + rales  7. RRR, No Gallops, Rubs or Murmurs, No Parasternal Heave.  8. Positive Bowel Sounds, Abdomen Soft, Non tender, No organomegaly appriciated,No rebound -guarding or rigidity.  9.  No Cyanosis, Normal Skin Turgor, No Skin Rash or Bruise.  10. Good muscle tone,  joints appear normal , no effusions, Normal ROM.  11. No Palpable Lymph Nodes in Neck or Axillae     Data Review  CBC  Recent Labs Lab 10/05/2013 1213  WBC 9.4  HGB 13.0  HCT 38.5*  PLT 148*  MCV 91.4  MCH 30.9  MCHC 33.8  RDW 13.6  LYMPHSABS 1.5  MONOABS 1.1*  EOSABS 0.1  BASOSABS 0.0   ------------------------------------------------------------------------------------------------------------------  Chemistries   Recent Labs Lab 10/20/2013 1213  NA 137  K 3.8  CL 92*  CO2 30  GLUCOSE 123*  BUN 54*  CREATININE 1.91*  CALCIUM 8.7  AST 18  ALT 11  ALKPHOS 57  BILITOT 1.4*   ------------------------------------------------------------------------------------------------------------------ CrCl is unknown because both a height and weight (above a minimum accepted value) are required for this calculation. ------------------------------------------------------------------------------------------------------------------ No results found for this basename: TSH, T4TOTAL, FREET3, T3FREE, THYROIDAB,  in the last 72 hours   Coagulation profile  Recent Labs Lab 10/05/2013 1213  INR 5.04*    ------------------------------------------------------------------------------------------------------------------- No results found for this basename: DDIMER,  in the last 72 hours -------------------------------------------------------------------------------------------------------------------  Cardiac Enzymes No results found for this basename: CK, CKMB, TROPONINI, MYOGLOBIN,  in the last 168 hours ------------------------------------------------------------------------------------------------------------------ No components found with this basename: POCBNP,    ---------------------------------------------------------------------------------------------------------------  Urinalysis    Component Value Date/Time   COLORURINE YELLOW 10/03/2013 1230   APPEARANCEUR CLEAR 10/15/2013 1230   LABSPEC 1.018 10/09/2013 1230   PHURINE 5.0 10/12/2013 1230   GLUCOSEU NEGATIVE 10/01/2013 1230   HGBUR MODERATE* 10/19/2013 1230   BILIRUBINUR NEGATIVE 10/10/2013 1230   BILIRUBINUR neg 10/07/2013 1444   KETONESUR NEGATIVE 10/16/2013 1230   PROTEINUR NEGATIVE 10/20/2013 1230   UROBILINOGEN 1.0 10/14/2013 1230   UROBILINOGEN negative 10/07/2013 1444   NITRITE NEGATIVE 10/12/2013 1230   NITRITE neg 10/07/2013 1444   LEUKOCYTESUR NEGATIVE 09/29/2013 1230    ----------------------------------------------------------------------------------------------------------------  Imaging results:    Dg Chest 2 View  10/04/2013   CLINICAL DATA:  Short of breath. Urinary tract infection and weakness.  EXAM: CHEST  2 VIEW  COMPARISON:  DG CHEST 2 VIEW dated 06/07/2013; DG CHEST 2 VIEW dated 11/02/2012  FINDINGS: Mild hyperinflation. Midline trachea. Moderate cardiomegaly. Small bilateral pleural effusions. No pneumothorax. Interstitial prominence indistinctness. This is increased. Bibasilar airspace disease. Median sternotomy.  IMPRESSION: Suspect moderate congestive heart failure, superimposed upon chronic interstitial  lung disease.  Small bilateral pleural effusions.  Patchy bibasilar airspace disease which could represent atelectasis or concurrent infection.   Electronically Signed   By: Jeronimo Greaves M.D.   On: 10/07/2013 15:09     Ct Head Wo Contrast  10/22/2013   CLINICAL DATA:  Generalized weakness  EXAM: CT HEAD WITHOUT CONTRAST  TECHNIQUE: Contiguous axial images were obtained from the base of the skull through the vertex without intravenous contrast.  COMPARISON:  01/10/2012  FINDINGS: No skull fracture is noted. Paranasal sinuses and mastoid air cells are unremarkable. Stable cerebral atrophy. Stable periventricular and patchy subcortical  chronic white matter disease. 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 No acute cortical infarction. No mass lesion is noted on this unenhanced scan.  IMPRESSION: No acute intracranial abnormality. Again noted atrophy and chronic white matter disease. 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   Electronically Signed   By: Natasha MeadLiviu  Pop M.D.   On: 10/15/2013 13:48     My personal review of EKG: Rhythm Afib, Rate  90 /min, LBBB , no Acute ST changes     Assessment & Plan    1. Early community-acquired pneumonia - admit, sputum culture, urinary strep pneumo and Legionella antigen, oxygen and nebulizer treatment as needed, IV Levaquin per pharmacy.    2. Acute on chronic gout flare which is rather generalized and diffuse. Hydrate with IV fluids, placed on colchicine and monitor.     3. CAD with ischemic cardiomyopathy and chronic combined diastolic systolic heart failure EF 20%, Old LBBB -  Not a candidate for AICD, no acute issues, chest pain-free, for now appears dehydrated and required IV fluids, continue beta blocker and antiplatelet medication Pletal, not ACE/ARB candidate due to renal insufficiency.     4. ARF on CKD 4 . Baseline creatinine  Around 1.6, will check urine electrolytes, gentle IV fluids, avoid nephrotoxins and monitor.     5. A.  fib. Goal will be rate controlled, continue beta blocker as told by blood pressure, Coumadin per pharmacy. Currently INR is supratherapeutic. We'll defer long-term anticoagulation use to PCP and primary cardiologist ordered depending on the clinical scenario upon discharge.     6. Interstitial lung disease. Supportive care for now no acute issues see #1 above.     7. AAA. Last known size is 5.6 cm, continue beta blocker for secondary prevention, not a candidate for surgery.    8.PAD - continue Pletal.    9.GERD - PPI    Patient and family interested in pursuing palliative care options Pall[facility] will be consulted.    DVT Prophylaxis Coumadin   AM Labs Ordered, also please review Full Orders  Family Communication: Admission, patients condition and plan of care including tests being ordered have been discussed with the patient and son and daughter who indicate understanding and agree with the plan and Code Status.   Code Status DNR  Likely DC to  SNF-Hospice  Condition GUARDED     Time spent in minutes : 35    SINGH,PRASHANT K M.D on 10/15/2013 at 3:52 PM  Between 7am to 7pm - Pager - [phone]  After 7pm go to [email or web address] - password [email or web address]  And look for the night coverage person covering me after hours  [facility] Office  [phone]

## 2013-10-25 NOTE — Progress Notes (Signed)
ANTICOAGULATION CONSULT NOTE - Initial Consult  Pharmacy Consult for warfarin Indication: atrial fibrillation  Allergies  Allergen Reactions  . Ace Inhibitors     Cough  . Cimetidine      unknown reaction, pt states per Dr. Jarold MottoPatterson  . Ciprofloxacin Other (See Comments)    unknown  . Nsaids     Dr does not want patient to take due to kidneys  . Zantac [Ranitidine Hcl]     Hepatitis per Dr. Jarold MottoPatterson  . Percocet [Oxycodone-Acetaminophen] Other (See Comments)    hallucinations    Patient Measurements:    Vital Signs: Temp: 98.1 F (36.7 C) (02/28 1449) Temp src: Rectal (02/28 1449) BP: 123/76 mmHg (02/28 1545) Pulse Rate: 84 (02/28 1545)  Labs:  Recent Labs  10/24/2013 1213  HGB 13.0  HCT 38.5*  PLT 148*  LABPROT 44.7*  INR 5.04*  CREATININE 1.91*    The CrCl is unknown because both a height and weight (above a minimum accepted value) are required for this calculation.   Medical History: Past Medical History  Diagnosis Date  . HTN (hypertension)   . GERD (gastroesophageal reflux disease)   . Hyperlipidemia   . CAD (coronary artery disease)     hx of CABG.  2013 with patent LIMA, and SVG to the diagonal patent the sequential limb to the ramus intermediate occluded, SVG to right coronary artery occluded, SVG to PDA open the sequential limb to posterolateral line and posterior lateral to occluded.  . MI (myocardial infarction) 1989  . Atrial fibrillation 1/09    s/p DCCV to NSR -now chronic  . Pulmonary granuloma     left  . Ascending aortic aneurysm     5.6 in 2013, stable at that time  . COPD (chronic obstructive pulmonary disease)   . Ischemic cardiomyopathy 10/02/2011    EF 25-30% on echo 5/13  . Hematuria   . Bleeding gastrointestinal   . Diabetes mellitus without complication   . Gout     Medications:  See med history  Assessment: [age] yom presented to the ED with generalized weakness. He is on chronic coumadin for afib. INR is supratherapeutic  at 5.04, H/H is WNL and plts are slightly low. No overt bleeding noted. Of note, doxycyline is also starting which can increase in the INR.  Goal of Therapy:  INR 2-3   Plan:  1. No warfarin tonight 2. Daily INR  Lemya Greenwell, Drake Leachachel Lynn 10/01/2013,4:10 PM

## 2013-10-25 NOTE — ED Provider Notes (Signed)
CSN: [id number]     Arrival date & time 10/15/2013  1107 History   First MD Initiated Contact with Patient 10/10/2013 1143     Chief Complaint  Patient presents with  . Urinary Tract Infection      HPI Patient presents emergency room with chief complaint of increased lethargy and decreased by mouth intake for the last 7-14 days.  Patient was treated for urinary tract infection several weeks ago.  Was told to come to the emergency room by his private medical physician because of inability to ambulate and rapid decline in his general health.family and patient deny any fever or or specific vomiting.  They deny any recent known falls.  Past Medical History  Diagnosis Date  . HTN (hypertension)   . GERD (gastroesophageal reflux disease)   . Hyperlipidemia   . CAD (coronary artery disease)     hx of CABG.  2013 with patent LIMA, and SVG to the diagonal patent the sequential limb to the ramus intermediate occluded, SVG to right coronary artery occluded, SVG to PDA open the sequential limb to posterolateral line and posterior lateral to occluded.  . MI (myocardial infarction) 1989  . Atrial fibrillation 1/09    s/p DCCV to NSR -now chronic  . Pulmonary granuloma     left  . Ascending aortic aneurysm     5.6 in 2013, stable at that time  . COPD (chronic obstructive pulmonary disease)   . Ischemic cardiomyopathy 10/02/2011    EF 25-30% on echo 5/13  . Hematuria   . Bleeding gastrointestinal   . Diabetes mellitus without complication   . Gout    Past Surgical History  Procedure Laterality Date  . Stomach surgery      PUD  . Mastoidectomy      right ear  . Cardiac catheterization  10/01/2011  . Coronary artery bypass graft  12/1994    LIMA-LAD, VG-diag; VG-PDA; occl. VG to PLA,occluded VG to LCX   . Pr vein bypass graft,aorto-fem-pop  1996  . Vagotomy  1973   Family History  Problem Relation Age of Onset  . Liver cancer Sister   . Hypertension Sister   . Cancer Sister   . Diabetes  Sister   . Hypertension Father   . Heart disease Father   . Hyperlipidemia Father   . Heart disease Mother    History  Substance Use Topics  . Smoking status: Former Smoker -- 0.50 packs/day for 45 years    Types: Cigarettes, Cigars    Start date: 08/28/1942    Quit date: 12/26/1987  . Smokeless tobacco: Never Used  . Alcohol Use: No     Comment: Occas Beer    Review of Systems  All other systems reviewed and are negative  Allergies  Ace inhibitors; Cimetidine; Ciprofloxacin; Nsaids; Zantac; and Percocet  Home Medications   Current Outpatient Rx  Name  Route  Sig  Dispense  Refill  . acetaminophen (TYLENOL) 500 MG tablet   Oral   Take 1,000 mg by mouth every 6 (six) hours as needed for moderate pain.         . carvedilol (COREG) 12.5 MG tablet   Oral   Take 1 tablet (12.5 mg total) by mouth 2 (two) times daily.   180 tablet   3   . cilostazol (PLETAL) 50 MG tablet   Oral   Take 50 mg by mouth 2 (two) times daily.         Marland Kitchen. docusate sodium (COLACE)  100 MG capsule   Oral   Take 1 capsule (100 mg total) by mouth every 12 (twelve) hours.   60 capsule   0   . furosemide (LASIX) 20 MG tablet   Oral   Take 3 tablets (60 mg total) by mouth 2 (two) times daily.   180 tablet   5   . LORazepam (ATIVAN) 0.5 MG tablet   Oral   Take 1 tablet (0.5 mg total) by mouth at bedtime as needed for anxiety (to help sleep). For anxiety   30 tablet   2   . terazosin (HYTRIN) 5 MG capsule   Oral   Take 1 capsule (5 mg total) by mouth at bedtime.   30 capsule   11   . warfarin (COUMADIN) 5 MG tablet   Oral   Take 2.5 mg by mouth daily. 2.5mg   M,Tu,W,Th,Sa,Su         . nitroGLYCERIN (NITROSTAT) 0.4 MG SL tablet   Sublingual   Place 1 tablet (0.4 mg total) under the tongue every 5 (five) minutes x 3 doses as needed for chest pain.   25 tablet   5    BP 117/66  Pulse 80  Temp(Src) 98.1 F (36.7 C) (Rectal)  Resp 15  SpO2 100% Physical Exam  Nursing note and  vitals reviewed. Constitutional: He is oriented to person, place, and time. He appears well-developed and well-nourished. He appears listless. No distress.  HENT:  Head: Normocephalic and atraumatic.  Eyes: Pupils are equal, round, and reactive to light.  Neck: Normal range of motion.  Cardiovascular: Normal rate and intact distal pulses.   Pulmonary/Chest: No respiratory distress.  Abdominal: Normal appearance. He exhibits no distension.  Musculoskeletal: Normal range of motion.  Neurological: He is oriented to person, place, and time. He appears listless. He displays atrophy. No cranial nerve deficit. He exhibits abnormal muscle tone. Gait abnormal. GCS eye subscore is 4. GCS verbal subscore is 5. GCS motor subscore is 6.  Skin: Skin is warm and dry. No rash noted.  Psychiatric: He has a normal mood and affect. His behavior is normal.    ED Course  Procedures (including critical care time) Labs Review Labs Reviewed  PROTIME-INR - Abnormal; Notable for the following:    Prothrombin Time 44.7 (*)    INR 5.04 (*)    All other components within normal limits  CBC WITH DIFFERENTIAL - Abnormal; Notable for the following:    RBC 4.21 (*)    HCT 38.5 (*)    Platelets 148 (*)    Monocytes Absolute 1.1 (*)    All other components within normal limits  COMPREHENSIVE METABOLIC PANEL - Abnormal; Notable for the following:    Chloride 92 (*)    Glucose, Bld 123 (*)    BUN 54 (*)    Creatinine, Ser 1.91 (*)    Albumin 2.6 (*)    Total Bilirubin 1.4 (*)    GFR calc non Af Amer 30 (*)    GFR calc Af Amer 35 (*)    All other components within normal limits  URINALYSIS, ROUTINE W REFLEX MICROSCOPIC - Abnormal; Notable for the following:    Hgb urine dipstick MODERATE (*)    All other components within normal limits  URIC ACID - Abnormal; Notable for the following:    Uric Acid, Serum 13.0 (*)    All other components within normal limits  URINE MICROSCOPIC-ADD ON - Abnormal; Notable for  the following:    Casts HYALINE  CASTS (*)    All other components within normal limits  URINE CULTURE  LACTIC ACID, PLASMA  OCCULT BLOOD X 1 CARD TO LAB, STOOL  URIC ACID  POC OCCULT BLOOD, ED   Imaging Review Dg Chest 2 View  10/01/2013   CLINICAL DATA:  Short of breath. Urinary tract infection and weakness.  EXAM: CHEST  2 VIEW  COMPARISON:  DG CHEST 2 VIEW dated 06/07/2013; DG CHEST 2 VIEW dated 11/02/2012  FINDINGS: Mild hyperinflation. Midline trachea. Moderate cardiomegaly. Small bilateral pleural effusions. No pneumothorax. Interstitial prominence indistinctness. This is increased. Bibasilar airspace disease. Median sternotomy.  IMPRESSION: Suspect moderate congestive heart failure, superimposed upon chronic interstitial lung disease.  Small bilateral pleural effusions.  Patchy bibasilar airspace disease which could represent atelectasis or concurrent infection.   Electronically Signed   By: Jeronimo Greaves M.D.   On: 10/03/2013 15:09   Ct Head Wo Contrast  10/17/2013   CLINICAL DATA:  Generalized weakness  EXAM: CT HEAD WITHOUT CONTRAST  TECHNIQUE: Contiguous axial images were obtained from the base of the skull through the vertex without intravenous contrast.  COMPARISON:  01/10/2012  FINDINGS: No skull fracture is noted. Paranasal sinuses and mastoid air cells are unremarkable. Stable cerebral atrophy. Stable periventricular and patchy subcortical chronic white matter disease. 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 No acute cortical infarction. No mass lesion is noted on this unenhanced scan.  IMPRESSION: No acute intracranial abnormality. Again noted atrophy and chronic white matter disease. 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   Electronically Signed   By: Natasha Mead M.D.   On: 10/17/2013 13:48     EKG Interpretation None     Paatient to be admitted MDM   Final diagnoses:  Weakness generalized  Elevated INR  Abnormal chest x-ray  Bronchitis        Nelia Shi, MD 10/31/13 1544

## 2013-10-26 LAB — URIC ACID: Uric Acid, Serum: 13 mg/dL — ABNORMAL HIGH (ref 4.0–7.8)

## 2013-10-26 LAB — BASIC METABOLIC PANEL
BUN: 59 mg/dL — ABNORMAL HIGH (ref 6–23)
CALCIUM: 8.8 mg/dL (ref 8.4–10.5)
CO2: 24 mEq/L (ref 19–32)
Chloride: 92 mEq/L — ABNORMAL LOW (ref 96–112)
Creatinine, Ser: 2.07 mg/dL — ABNORMAL HIGH (ref 0.50–1.35)
GFR, EST AFRICAN AMERICAN: 32 mL/min — AB (ref >90)
GFR, EST NON AFRICAN AMERICAN: 28 mL/min — AB (ref >90)
Glucose, Bld: 160 mg/dL — ABNORMAL HIGH (ref 70–99)
POTASSIUM: 4.6 meq/L (ref 3.7–5.3)
SODIUM: 134 meq/L — AB (ref 137–147)

## 2013-10-26 LAB — CBC
HCT: 41.6 % (ref 39.0–52.0)
HEMOGLOBIN: 14.1 g/dL (ref 13.0–17.0)
MCH: 31.1 pg (ref 26.0–34.0)
MCHC: 33.9 g/dL (ref 30.0–36.0)
MCV: 91.8 fL (ref 78.0–100.0)
PLATELETS: 160 10*3/uL (ref 150–400)
RBC: 4.53 MIL/uL (ref 4.22–5.81)
RDW: 13.8 % (ref 11.5–15.5)
WBC: 10.4 10*3/uL (ref 4.0–10.5)

## 2013-10-26 LAB — PROTIME-INR
INR: 5.93 (ref 0.00–1.49)
Prothrombin Time: 50.6 seconds — ABNORMAL HIGH (ref 11.6–15.2)

## 2013-10-26 MED ORDER — MORPHINE SULFATE 2 MG/ML IJ SOLN
1.0000 mg | INTRAMUSCULAR | Status: DC | PRN
  Administered 2013-10-26: 1 mg via INTRAVENOUS
  Filled 2013-10-26: qty 1

## 2013-10-26 MED ORDER — QUETIAPINE FUMARATE 25 MG PO TABS
25.0000 mg | ORAL_TABLET | Freq: Two times a day (BID) | ORAL | Status: DC
  Administered 2013-10-26: 25 mg via ORAL
  Filled 2013-10-26 (×6): qty 1

## 2013-10-26 MED ORDER — MORPHINE SULFATE 2 MG/ML IJ SOLN
1.0000 mg | INTRAMUSCULAR | Status: DC | PRN
  Administered 2013-10-26 – 2013-10-27 (×5): 2 mg via INTRAVENOUS
  Filled 2013-10-26 (×5): qty 1

## 2013-10-26 MED ORDER — SODIUM CHLORIDE 0.9 % IV SOLN
INTRAVENOUS | Status: AC
  Administered 2013-10-26: 17:00:00 via INTRAVENOUS

## 2013-10-26 MED ORDER — VITAMIN K1 10 MG/ML IJ SOLN
2.0000 mg | Freq: Once | INTRAVENOUS | Status: AC
  Administered 2013-10-26: 2 mg via INTRAVENOUS
  Filled 2013-10-26: qty 0.2

## 2013-10-26 MED ORDER — HALOPERIDOL LACTATE 5 MG/ML IJ SOLN
5.0000 mg | Freq: Four times a day (QID) | INTRAMUSCULAR | Status: DC | PRN
  Administered 2013-10-26 – 2013-10-27 (×2): 5 mg via INTRAVENOUS
  Filled 2013-10-26 (×2): qty 1

## 2013-10-26 MED ORDER — DOXYCYCLINE HYCLATE 100 MG PO TABS
100.0000 mg | ORAL_TABLET | Freq: Two times a day (BID) | ORAL | Status: DC
  Administered 2013-10-26: 100 mg via ORAL
  Filled 2013-10-26 (×3): qty 1

## 2013-10-26 MED ORDER — LORAZEPAM 2 MG/ML IJ SOLN: 1.0000 mg | INTRAMUSCULAR | Status: DC | PRN

## 2013-10-26 NOTE — Progress Notes (Signed)
ANTICOAGULATION CONSULT NOTE  Pharmacy Consult for warfarin Indication: atrial fibrillation  Allergies  Allergen Reactions  . Ace Inhibitors     Cough  . Cimetidine      unknown reaction, pt states per Dr. Jarold MottoPatterson  . Ciprofloxacin Other (See Comments)    unknown  . Nsaids     Dr does not want patient to take due to kidneys  . Zantac [Ranitidine Hcl]     Hepatitis per Dr. Jarold MottoPatterson  . Percocet [Oxycodone-Acetaminophen] Other (See Comments)    hallucinations    Patient Measurements: Height: 5\' 11"  (180.3 cm) Weight: 160 lb 4.4 oz (72.7 kg) IBW/kg (Calculated) : 75.3  Vital Signs: Temp: 98.1 F (36.7 C) (03/01 0526) Temp src: Oral (03/01 0526) BP: 102/66 mmHg (03/01 0526) Pulse Rate: 101 (03/01 0526)  Labs:  Recent Labs  Sep 27, 2013 1213 10/26/13 0402  HGB 13.0 14.1  HCT 38.5* 41.6  PLT 148* 160  LABPROT 44.7* 50.6*  INR 5.04* 5.93*  CREATININE 1.91* 2.07*    Estimated Creatinine Clearance: 26.8 ml/min (by C-G formula based on Cr of 2.07).  Assessment: [age] yom presented to the ED with generalized weakness. He is on chronic coumadin for afib. INR is supratherapeutic at 5.04 on admission and 5.93 this morning.  Vitamin K 2 mg IV has been given today. Hg 14.1, pltc up to 160 from 148 on admit.  No overt bleeding noted. Of note, doxycyline started which can increase in the INR. Home coumadin dose reported as  2.5 mg daily except none on Fridays, last dose Friday 2/27- does not make sense since he should not get a dose on Fridays.  Per MD note "We'll defer long-term anticoagulation use to PCP and primary cardiologist ordered depending on the clinical scenario upon discharge." Palliative care consulted as pt and family interested in hospice.    Goal of Therapy:  INR 2-3   Plan:  1. No warfarin tonight 2. Vitamin K 2 mg IV given by MD 3. Daily INR 4. Change IV doxy to PO per protocol  Herby AbrahamMichelle T. Xena Propst, Pharm.D. [phone] 10/26/2013 9:41 AM

## 2013-10-26 NOTE — Progress Notes (Signed)
Patient Demographics  [name] [name], is a 80 y.o. male, DOB - 1926-07-17, ZOX:[id number]  Admit date - 10/13/2013   Admitting Physician Leroy SeaPrashant K Rameses Ou, MD  Outpatient Primary MD for the patient is Rudi HeapMOORE, DONALD, MD  LOS - 1   Chief Complaint  Patient presents with  . Urinary Tract Infection        Assessment & Plan    1. Early community-acquired pneumonia - admit, sputum culture, urinary strep pneumo is negative and pending Legionella antigen, oxygen and nebulizer treatment as needed, IV Levaquin per pharmacy. Take improved.    2. Acute on chronic gout flare which is rather generalized and diffuse. Hydrate with IV fluids, placed on colchicine,  he is much improved today.    3. CAD with ischemic cardiomyopathy and chronic combined diastolic systolic heart failure EF 20%, Old LBBB - Not a candidate for AICD, no acute issues, chest pain-free, for now appears dehydrated and required IV fluids, continue beta blocker and antiplatelet medication Pletal, not ACE/ARB candidate due to renal insufficiency.     4. ARF on CKD 4 . Baseline creatinine Around 1.6, will check urine electrolytes, gentle IV fluids, avoid nephrotoxins and monitor.     5. A. fib. Goal will be rate controlled, continue beta blocker as told by blood pressure, Coumadin per pharmacy. Currently INR is supratherapeutic. We'll defer long-term anticoagulation use to PCP and primary cardiologist ordered depending on the clinical scenario upon discharge.     6. Interstitial lung disease. Supportive care for now no acute issues see #1 above.     7. AAA. Last known size is 5.6 cm, continue beta blocker for secondary prevention, not a candidate for surgery.     8.PAD - continue Pletal.     9.GERD - PPI     Patient and family  interested in pursuing palliative care options Pall.Care will be consulted.       Code Status: DNR  Family Communication: Discussed with son and daughter upon admission  Disposition Plan: With hospice   Procedures CT Head   Consults  Pall Care   Medications  Scheduled Meds: . carvedilol  12.5 mg Oral BID  . colchicine  0.6 mg Oral BID  . docusate sodium  100 mg Oral Q12H  . doxycycline  100 mg Oral Q12H  . Warfarin - Pharmacist Dosing Inpatient   Does not apply q1800   Continuous Infusions: . sodium chloride 50 mL/hr at 10/26/13 0816   PRN Meds:.alum & mag hydroxide-simeth, guaiFENesin-dextromethorphan, HYDROcodone-acetaminophen, LORazepam, nitroGLYCERIN, ondansetron (ZOFRAN) IV, polyethylene glycol  DVT Prophylaxis  Coumadin- SCDs    Lab Results  Component Value Date   PLT 160 10/26/2013    Antibiotics    Anti-infectives   Start     Dose/Rate Route Frequency Ordered Stop   10/26/13 1800  doxycycline (VIBRA-TABS) tablet 100 mg     100 mg Oral Every 12 hours 10/26/13 0932     10/12/2013 1700  doxycycline (VIBRAMYCIN) 100 mg in dextrose 5 % 250 mL IVPB  Status:  Discontinued     100 mg 125 mL/hr over 120 Minutes Intravenous Every 12 hours 10/05/2013 1607 10/26/13 0931          Subjective:   [name] Bitteravid Beaulieu today has, No headache,  No chest pain, No abdominal pain - No Nausea, No new weakness tingling or numbness, improved generalized weakness and joint pains, No Cough - SOB.    Objective:   Filed Vitals:   11/09/13 1700 11-09-13 2104 10/26/13 0100 10/26/13 0526  BP: 120/77 126/76  102/66  Pulse: 85 95  101  Temp: 97.4 F (36.3 C) 97.8 F (36.6 C)  98.1 F (36.7 C)  TempSrc: Oral Oral  Oral  Resp: 22 20  20   Height: 5\' 11"  (1.803 m)     Weight: 68.2 kg (150 lb 5.7 oz)   72.7 kg (160 lb 4.4 oz)  SpO2: 100% 100% 98% 99%    Wt Readings from Last 3 Encounters:  10/26/13 72.7 kg (160 lb 4.4 oz)  10/01/13 76.204 kg (168 lb)  08/13/13 75.297 kg (166 lb)      Intake/Output Summary (Last 24 hours) at 10/26/13 1009 Last data filed at 10/26/13 0645  Gross per 24 hour  Intake   2115 ml  Output      4 ml  Net   2111 ml     Physical Exam  Awake Alert, Oriented X 3, No new F.N deficits, Normal affect [place].AT,PERRAL Supple Neck,No JVD, No cervical lymphadenopathy appriciated.  Symmetrical Chest wall movement, Good air movement bilaterally, CTAB RRR,No Gallops,Rubs or new Murmurs, No Parasternal Heave +ve B.Sounds, Abd Soft, Non tender, No organomegaly appriciated, No rebound - guarding or rigidity. No Cyanosis, Clubbing or edema, No new Rash or bruise , R elbow gouty tophus     Data Review   Micro Results No results found for this or any previous visit (from the past 240 hour(s)).  Radiology Reports Dg Chest 2 View  11/09/13   CLINICAL DATA:  Short of breath. Urinary tract infection and weakness.  EXAM: CHEST  2 VIEW  COMPARISON:  DG CHEST 2 VIEW dated 06/07/2013; DG CHEST 2 VIEW dated 11/02/2012  FINDINGS: Mild hyperinflation. Midline trachea. Moderate cardiomegaly. Small bilateral pleural effusions. No pneumothorax. Interstitial prominence indistinctness. This is increased. Bibasilar airspace disease. Median sternotomy.  IMPRESSION: Suspect moderate congestive heart failure, superimposed upon chronic interstitial lung disease.  Small bilateral pleural effusions.  Patchy bibasilar airspace disease which could represent atelectasis or concurrent infection.   Electronically Signed   By: Jeronimo Greaves M.D.   On: 09-Nov-2013 15:09   Ct Head Wo Contrast  11-09-2013   CLINICAL DATA:  Generalized weakness  EXAM: CT HEAD WITHOUT CONTRAST  TECHNIQUE: Contiguous axial images were obtained from the base of the skull through the vertex without intravenous contrast.  COMPARISON:  01/10/2012  FINDINGS: No skull fracture is noted. Paranasal sinuses and mastoid air cells are unremarkable. Stable cerebral atrophy. Stable periventricular and patchy subcortical  chronic white matter disease. 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 No acute cortical infarction. No mass lesion is noted on this unenhanced scan.  IMPRESSION: No acute intracranial abnormality. Again noted atrophy and chronic white matter disease. 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   Electronically Signed   By: Natasha Mead M.D.   On: 09-Nov-2013 13:48    CBC  Recent Labs Lab 11/09/13 1213 10/26/13 0402  WBC 9.4 10.4  HGB 13.0 14.1  HCT 38.5* 41.6  PLT 148* 160  MCV 91.4 91.8  MCH 30.9 31.1  MCHC 33.8 33.9  RDW 13.6 13.8  LYMPHSABS 1.5  --   MONOABS 1.1*  --   EOSABS 0.1  --   BASOSABS 0.0  --     Chemistries  Recent Labs Lab 11-17-2013 1213 10/26/13 0402  NA 137 134*  K 3.8 4.6  CL 92* 92*  CO2 30 24  GLUCOSE 123* 160*  BUN 54* 59*  CREATININE 1.91* 2.07*  CALCIUM 8.7 8.8  AST 18  --   ALT 11  --   ALKPHOS 57  --   BILITOT 1.4*  --    ------------------------------------------------------------------------------------------------------------------ estimated creatinine clearance is 26.8 ml/min (by C-G formula based on Cr of 2.07). ------------------------------------------------------------------------------------------------------------------ No results found for this basename: HGBA1C,  in the last 72 hours ------------------------------------------------------------------------------------------------------------------ No results found for this basename: CHOL, HDL, LDLCALC, TRIG, CHOLHDL, LDLDIRECT,  in the last 72 hours ------------------------------------------------------------------------------------------------------------------ No results found for this basename: TSH, T4TOTAL, FREET3, T3FREE, THYROIDAB,  in the last 72 hours ------------------------------------------------------------------------------------------------------------------ No results found for this basename: VITAMINB12, FOLATE, FERRITIN, TIBC, IRON, RETICCTPCT,  in the last 72  hours  Coagulation profile  Recent Labs Lab 2013-11-17 1213 10/26/13 0402  INR 5.04* 5.93*    No results found for this basename: DDIMER,  in the last 72 hours  Cardiac Enzymes No results found for this basename: CK, CKMB, TROPONINI, MYOGLOBIN,  in the last 168 hours ------------------------------------------------------------------------------------------------------------------ No components found with this basename: POCBNP,      Time Spent in minutes   35   Zahria Ding K M.D on 10/26/2013 at 10:09 AM  Between 7am to 7pm - Pager - [phone]  After 7pm go to [email or web address] - password [email or web address]  And look for the night coverage person covering for me after hours  [facility] Office  [phone]

## 2013-10-26 NOTE — Progress Notes (Signed)
PT Cancellation Note  Patient Details Name: [name] [name] MRN: [id number] DOB: June 30, 1926   Cancelled Treatment:    Reason Eval/Treat Not Completed: Other (comment).  Family requested that PT sign off.  They report patient has been bed bound for a while and is dying and they prefer to keep him comfortable and not pursue PT at this time.  PT will sign off. Thank you,   Olivia CanterMoton, Zared Knoth M, [place] CarolinaPT [phone] 10/26/2013, 2:01 PM

## 2013-10-26 NOTE — Progress Notes (Signed)
Family at bedside since this morning.  They communicated their wish for hospice care and the need for medication to ease patient's restlessness and SOB.  Dr Thedore MinsSingh notified.

## 2013-10-26 DEATH — deceased

## 2013-10-27 DIAGNOSIS — Z515 Encounter for palliative care: Secondary | ICD-10-CM

## 2013-10-27 DIAGNOSIS — Z66 Do not resuscitate: Secondary | ICD-10-CM

## 2013-10-27 DIAGNOSIS — N183 Chronic kidney disease, stage 3 unspecified: Secondary | ICD-10-CM

## 2013-10-27 DIAGNOSIS — J8410 Pulmonary fibrosis, unspecified: Secondary | ICD-10-CM

## 2013-10-27 LAB — BASIC METABOLIC PANEL
BUN: 84 mg/dL — ABNORMAL HIGH (ref 6–23)
CALCIUM: 8.7 mg/dL (ref 8.4–10.5)
CHLORIDE: 92 meq/L — AB (ref 96–112)
CO2: 23 mEq/L (ref 19–32)
CREATININE: 3.06 mg/dL — AB (ref 0.50–1.35)
GFR calc Af Amer: 20 mL/min — ABNORMAL LOW (ref >90)
GFR calc non Af Amer: 17 mL/min — ABNORMAL LOW (ref >90)
GLUCOSE: 115 mg/dL — AB (ref 70–99)
Potassium: 4.5 mEq/L (ref 3.7–5.3)
Sodium: 132 mEq/L — ABNORMAL LOW (ref 137–147)

## 2013-10-27 LAB — URINE CULTURE

## 2013-10-27 LAB — PROTIME-INR
INR: 2.53 — ABNORMAL HIGH (ref 0.00–1.49)
Prothrombin Time: 26.4 seconds — ABNORMAL HIGH (ref 11.6–15.2)

## 2013-10-27 LAB — LEGIONELLA ANTIGEN, URINE: LEGIONELLA ANTIGEN, URINE: NEGATIVE

## 2013-10-27 MED ORDER — SODIUM CHLORIDE 0.9 % IV SOLN
1.0000 mg/h | INTRAVENOUS | Status: DC
  Administered 2013-10-27: 1 mg/h via INTRAVENOUS
  Filled 2013-10-27: qty 10

## 2013-10-27 MED ORDER — BISACODYL 10 MG RE SUPP: 10.0000 mg | Freq: Every day | RECTAL | Status: DC | PRN

## 2013-10-27 MED ORDER — SCOPOLAMINE 1 MG/3DAYS TD PT72
1.0000 | MEDICATED_PATCH | TRANSDERMAL | Status: DC
  Administered 2013-10-27: 1.5 mg via TRANSDERMAL
  Filled 2013-10-27: qty 1

## 2013-10-27 MED ORDER — MORPHINE SULFATE 2 MG/ML IJ SOLN
2.0000 mg | INTRAMUSCULAR | Status: DC | PRN
  Administered 2013-10-27 (×5): 2 mg via INTRAVENOUS
  Filled 2013-10-27 (×5): qty 1

## 2013-10-27 MED ORDER — ATROPINE SULFATE 1 % OP SOLN
4.0000 [drp] | OPHTHALMIC | Status: DC | PRN
  Administered 2013-10-27 – 2013-10-28 (×3): 4 [drp] via SUBLINGUAL
  Filled 2013-10-27: qty 2

## 2013-10-27 NOTE — Clinical Social Work Note (Signed)
CSW received consult from MD for residential hospice placement. CSW contacted palliative NP regarding consult. Per palliative NP, pt is expected to have a hospital death. Palliative NP stated that she will follow-up with pt in the morning and consult CSW if residential hospice placement is necessary. CSW currently holding off on speaking with family regarding residential hospice placement if hospital death is expected on 10/27/2013. CSW will follow-up with palliative NP in the morning of 11/07/2013.  [name] ChamberEmily [name], LCSWA Clinical Social Worker [phone]

## 2013-10-27 NOTE — Progress Notes (Signed)
Periods of apnea >232min. Family @ bs

## 2013-10-27 NOTE — Consult Note (Signed)
Patient [name] [name]      DOB: 02/15/1926      EXB:[id number]     Consult Note from the Palliative Medicine Team at [facility] Requested by: Dr. Candiss Norse     PCP: Redge Gainer, MD Reason for Consultation: [place] and options.    Phone Number:[phone]  Assessment of patients Current state: Mr. [name] [name] is a 80 yo male admitted with worsening weakness, shortness of breath, and cough over past 3 weeks with great worsening over past week. Mr. [name] [name] has PMH of CAD, systolic CHF (EF 10%), COPD, 2 LPM oxygen at home, atrial fibrillation, CKD stage 3, DM 2, and ascending aortic aneurysm.   I met today with his family: [name] [name] (son, HCPOA) and his wife, and daughter [name] [name]. Mr. Feighner family tells me that he has been struggling and has physically declined since his other son died of lung cancer ~8 months ago. He has been ambulating with a walker the past 3 years and has been suffering with his lung disease and breathing. He tells them that he has no quality of life left. His appetite has decreased and he has now not eaten anything in the past 4 days. His daughter-in-law says that she gave him a little bit of water out of the end of a straw and it ran out of his mouth yesterday. The family has noted swelling of his hands overnight and attribute this to worsening of his renal function. They are very realistic in their goals and wish for [name] [name] to be made comfortable. They wish to focus on full comfort and want him to be peaceful as he dies. They have noticed his long pauses in respirations and [name] [name] says that he see the same signs in [name] [name] as when their brother died. They say that the Morphine has helped him and that he was suffering prior to the frequent Morphine administrations. They say that he has extreme pain from his gout, shortness of breath, and trouble sleeping but these have all seemed controlled after initiating Morphine last night. We did discuss his poor prognosis as hours to days and I  would not be surprised if he died in the next 24-48 hours.   I will continue to follow and support [name] [name] and his family holistically and help manage his symptoms at end of life. Please call with any changes/needs.    Goals of Care: 1.  Code Status: DNR   2. Scope of Treatment: 1. Vital Signs: daily 2. Respiratory/Oxygen: for comfort 3. Nutritional Support/Tube Feeds: no 4. Antibiotics: no 5. Blood Products: no 6. IVF: KVO 7. Review of Medications to be discontinued: Minimized for comfort. 8. Labs: no 9. Telemetry: no   4. Disposition: Anticipate hospital death.    3. Symptom Management:   1. Anxiety/Agitation: Haldol prn. 2. Pain: Morphine prn. May consider Morphine continuous infusion.  3. Bowel Regimen: Dulcolax supp prn.  4. Nausea/Vomiting: Ondansetron prn.  5. Terminal Secretions: Scopolamine patch. Atropine SL prn.   4. Psychosocial: Emotional support provided to patient and to family during difficult discussion and difficult decisions.    Patient Documents Completed or Given: Document Given Completed  Advanced Directives Pkt    MOST    DNR    Gone from My Sight    Hard Choices yes     Brief HPI: 80 yo male with systolic CHF (EF 27%), interstitial lung disease, oxygen dependent at home (2 lpm), atrial fibrillation, CKD 3, gout, DM 2, AAA. Increasing  weakness over the past 3 weeks with last week prior to admission extreme weakness and bed-bound with family unable to continue care for him. He has also had increasing shortness of breath and cough over past week and decreased appetite. He also having trouble speaking d/t his shortness of breath per family worsening over past weeks.    ROS: Unable to elicit - cannot speak d/t SOB    PMH:  Past Medical History  Diagnosis Date  . HTN (hypertension)   . GERD (gastroesophageal reflux disease)   . Hyperlipidemia   . CAD (coronary artery disease)     hx of CABG.  2013 with patent LIMA, and SVG to the diagonal  patent the sequential limb to the ramus intermediate occluded, SVG to right coronary artery occluded, SVG to PDA open the sequential limb to posterolateral line and posterior lateral to occluded.  . MI (myocardial infarction) 1989  . Atrial fibrillation 1/09    s/p DCCV to NSR -now chronic  . Pulmonary granuloma     left  . Ascending aortic aneurysm     5.6 in 2013, stable at that time  . COPD (chronic obstructive pulmonary disease)   . Ischemic cardiomyopathy 10/02/2011    EF 25-30% on echo 5/13  . Hematuria   . Bleeding gastrointestinal   . Diabetes mellitus without complication   . Gout   . Hepatitis C      PSH: Past Surgical History  Procedure Laterality Date  . Stomach surgery      PUD  . Mastoidectomy      right ear  . Cardiac catheterization  10/01/2011  . Coronary artery bypass graft  12/1994    LIMA-LAD, VG-diag; VG-PDA; occl. VG to PLA,occluded VG to LCX   . Pr vein bypass graft,aorto-fem-pop  1996  . Vagotomy  1973   I have reviewed the [place] and SH and  If appropriate update it with new information. Allergies  Allergen Reactions  . Ace Inhibitors     Cough  . Cimetidine      unknown reaction, pt states per Dr. Sharlett Iles  . Ciprofloxacin Other (See Comments)    unknown  . Nsaids     Dr does not want patient to take due to kidneys  . Zantac [Ranitidine Hcl]     Hepatitis per Dr. Sharlett Iles  . Percocet [Oxycodone-Acetaminophen] Other (See Comments)    hallucinations   Scheduled Meds: . carvedilol  12.5 mg Oral BID  . colchicine  0.6 mg Oral BID  . docusate sodium  100 mg Oral Q12H  . doxycycline  100 mg Oral Q12H  . QUEtiapine  25 mg Oral BID  . Warfarin - Pharmacist Dosing Inpatient   Does not apply q1800   Continuous Infusions: . sodium chloride 50 mL/hr at 10/26/13 1639   PRN Meds:.alum & mag hydroxide-simeth, guaiFENesin-dextromethorphan, haloperidol lactate, HYDROcodone-acetaminophen, LORazepam, morphine injection, nitroGLYCERIN, ondansetron (ZOFRAN)  IV, polyethylene glycol    BP 119/68  Pulse 83  Temp(Src) 97.2 F (36.2 C) (Axillary)  Resp 18  Ht _0  (1.803 m)  Wt 72.7 kg (160 lb 4.4 oz)  BMI 22.36 kg/m2  SpO2 93%   PPS: 20%   Intake/Output Summary (Last 24 hours) at 10/27/13 0856 Last data filed at 10/27/13 0510  Gross per 24 hour  Intake    360 ml  Output    103 ml  Net    257 ml   LBM: 10/03/2013  Physical Exam:  General: NAD, pale, ill appearing HEENT: [place]/AT, lips cyanotic, no JVD, dry mucous membranes Chest: Coarse throught except clear in LUL, pauses in respirations >2 min CVS: RRR, S1 S2 Abdomen: Soft, NT, ND, hypoactive BS Ext: Trace edema BLE, swelling in bilat hands Neuro: Arousable, follows commands  Labs: CBC    Component Value Date/Time   WBC 10.4 10/26/2013 0402   WBC 6.8 08/14/2013 1018   WBC 6.5 06/25/2012   RBC 4.53 10/26/2013 0402   RBC 4.6* 08/14/2013 1018   HGB 14.1 10/26/2013 0402   HGB 14.0* 08/14/2013 1018   HCT 41.6 10/26/2013 0402   HCT 41.8* 08/14/2013 1018   PLT 160 10/26/2013 0402   MCV 91.8 10/26/2013 0402   MCV 91.6 08/14/2013 1018   MCH 31.1 10/26/2013 0402   MCH 30.8 08/14/2013 1018   MCHC 33.9 10/26/2013 0402   MCHC 33.6 08/14/2013 1018   RDW 13.8 10/26/2013 0402   LYMPHSABS 1.5 10/15/2013 1213   MONOABS 1.1* 10/10/2013 1213   EOSABS 0.1 10/20/2013 1213   BASOSABS 0.0 10/17/2013 1213    BMET    Component Value Date/Time   NA 132* 10/27/2013 0555   NA 141 08/13/2013 1603   K 4.5 10/27/2013 0555   CL 92* 10/27/2013 0555   CO2 23 10/27/2013 0555   GLUCOSE 115* 10/27/2013 0555   GLUCOSE 126* 08/13/2013 1603   BUN 84* 10/27/2013 0555   BUN 53* 08/13/2013 1603   CREATININE 3.06* 10/27/2013 0555   CREATININE 2.06* 09/03/2013 1028   CREATININE 1.7* 09/30/2012   CALCIUM 8.7 10/27/2013 0555   GFRNONAA 17* 10/27/2013 0555   GFRAA 20* 10/27/2013 0555    CMP     Component Value Date/Time   NA 132* 10/27/2013 0555   NA 141 08/13/2013 1603   K 4.5 10/27/2013 0555   CL 92* 10/27/2013  0555   CO2 23 10/27/2013 0555   GLUCOSE 115* 10/27/2013 0555   GLUCOSE 126* 08/13/2013 1603   BUN 84* 10/27/2013 0555   BUN 53* 08/13/2013 1603   CREATININE 3.06* 10/27/2013 0555   CREATININE 2.06* 09/03/2013 1028   CREATININE 1.7* 09/30/2012   CALCIUM 8.7 10/27/2013 0555   PROT 6.8 10/10/2013 1213   PROT 7.0 08/13/2013 1603   ALBUMIN 2.6* 10/04/2013 1213   AST 18 10/10/2013 1213   ALT 11 09/29/2013 1213   ALKPHOS 57 10/05/2013 1213   BILITOT 1.4* 10/17/2013 1213   GFRNONAA 17* 10/27/2013 0555   GFRAA 20* 10/27/2013 0555     Time In Time Out Total Time Spent with Patient Total Overall Time  0820 0930 28mn 785m    Greater than 50%  of this time was spent counseling and coordinating care related to the above assessment and plan.  AlVinie SillNP Palliative Medicine Team Pager # [phone]-F 8a-5p) Team Phone # [phone]/Weekends)

## 2013-10-27 NOTE — Progress Notes (Signed)
Patient Demographics  [name] BitterDavid Bottino, is a 80 y.o. male, DOB - 06-23-1926, UJW:[id number]  Admit date - 10/15/2013   Admitting Physician Leroy SeaPrashant K Zahriah Roes, MD  Outpatient Primary MD for the patient is Rudi HeapMOORE, DONALD, MD  LOS - 2   Chief Complaint  Patient presents with  . Urinary Tract Infection        Assessment & Plan     Patient and family interested in pursuing palliative care options Pall.Care will be consulted, he has been transitioned to full comfort care upon family request, expected to pass away soon. All care and directed towards comfort only now.   Other issues addressed this admission were   1. Early community-acquired pneumonia - admit, sputum culture, urinary strep pneumo is negative and pending Legionella antigen, oxygen and nebulizer treatment as needed, IV Levaquin per pharmacy. Take improved.    2. Acute on chronic gout flare which is rather generalized and diffuse. Hydrate with IV fluids, placed on colchicine,  he is much improved today.    3. CAD with ischemic cardiomyopathy and chronic combined diastolic systolic heart failure EF 20%, Old LBBB - Not a candidate for AICD, no acute issues, chest pain-free, for now appears dehydrated and required IV fluids, continue beta blocker and antiplatelet medication Pletal, not ACE/ARB candidate due to renal insufficiency.     4. ARF on CKD 4 . Baseline creatinine Around 1.6, will check urine electrolytes, gentle IV fluids, avoid nephrotoxins and monitor.     5. A. fib. Goal will be rate controlled, continue beta blocker as told by blood pressure, Coumadin per pharmacy. Currently INR is supratherapeutic. We'll defer long-term anticoagulation use to PCP and primary cardiologist ordered depending on the clinical scenario upon discharge.      6. Interstitial lung disease. Supportive care for now no acute issues see #1 above.     7. AAA. Last known size is 5.6 cm, continue beta blocker for secondary prevention, not a candidate for surgery.     8.PAD - continue Pletal.     9.GERD - PPI         Code Status: DNR  Family Communication: Discussed with son and daughter upon admission  Disposition Plan: With hospice   Procedures CT Head   Consults  Pall Care   Medications  Scheduled Meds: . scopolamine  1 patch Transdermal Q72H   Continuous Infusions:   PRN Meds:.atropine, bisacodyl, haloperidol lactate, morphine injection, nitroGLYCERIN, ondansetron (ZOFRAN) IV  DVT Prophylaxis  Coumadin- SCDs    Lab Results  Component Value Date   PLT 160 10/26/2013    Antibiotics    Anti-infectives   Start     Dose/Rate Route Frequency Ordered Stop   10/26/13 1800  doxycycline (VIBRA-TABS) tablet 100 mg  Status:  Discontinued     100 mg Oral Every 12 hours 10/26/13 0932 10/27/13 0909   10/05/2013 1700  doxycycline (VIBRAMYCIN) 100 mg in dextrose 5 % 250 mL IVPB  Status:  Discontinued     100 mg 125 mL/hr over 120 Minutes Intravenous Every 12 hours 10/06/2013 1607 10/26/13 0931          Subjective:   [name] [name] [place] today has, No headache, No chest pain, No abdominal pain - No Nausea, No new weakness  tingling or numbness, improved generalized weakness and joint pains, No Cough - SOB.    Objective:   Filed Vitals:   10/26/13 0526 10/26/13 1248 10/26/13 1338 10/27/13 0510  BP: 102/66 124/72 122/78 119/68  Pulse: 101 99 93 83  Temp: 98.1 F (36.7 C)  97.4 F (36.3 C) 97.2 F (36.2 C)  TempSrc: Oral  Oral Axillary  Resp: 20  18 18   Height:      Weight: 72.7 kg (160 lb 4.4 oz)     SpO2: 99% 100% 98% 93%    Wt Readings from Last 3 Encounters:  10/26/13 72.7 kg (160 lb 4.4 oz)  10/01/13 76.204 kg (168 lb)  08/13/13 75.297 kg (166 lb)     Intake/Output Summary (Last 24 hours) at 10/27/13  1236 Last data filed at 10/27/13 0900  Gross per 24 hour  Intake    360 ml  Output    104 ml  Net    256 ml     Physical Exam  Awake Alert, Oriented X 3, No new F.N deficits, Normal affect [place].AT,PERRAL Supple Neck,No JVD, No cervical lymphadenopathy appriciated.  Symmetrical Chest wall movement, Good air movement bilaterally, CTAB RRR,No Gallops,Rubs or new Murmurs, No Parasternal Heave +ve B.Sounds, Abd Soft, Non tender, No organomegaly appriciated, No rebound - guarding or rigidity. No Cyanosis, Clubbing or edema, No new Rash or bruise , R elbow gouty tophus     Data Review   Micro Results Recent Results (from the past 240 hour(s))  URINE CULTURE     Status: None   Collection Time    10/14/2013 12:30 PM      Result Value Ref Range Status   Specimen Description URINE, CLEAN CATCH   Final   Special Requests NONE   Final   Culture  Setup Time     Final   Value: 10/10/2013 23:48     Performed at [facility] Count     Final   Value: 35,000 COLONIES/ML     Performed at [facility]   Culture     Final   Value: ESCHERICHIA COLI     Performed at [facility]   Report Status 10/27/2013 FINAL   Final   Organism ID, Bacteria ESCHERICHIA COLI   Final    Radiology Reports Dg Chest 2 View  10/06/2013   CLINICAL DATA:  Short of breath. Urinary tract infection and weakness.  EXAM: CHEST  2 VIEW  COMPARISON:  DG CHEST 2 VIEW dated 06/07/2013; DG CHEST 2 VIEW dated 11/02/2012  FINDINGS: Mild hyperinflation. Midline trachea. Moderate cardiomegaly. Small bilateral pleural effusions. No pneumothorax. Interstitial prominence indistinctness. This is increased. Bibasilar airspace disease. Median sternotomy.  IMPRESSION: Suspect moderate congestive heart failure, superimposed upon chronic interstitial lung disease.  Small bilateral pleural effusions.  Patchy bibasilar airspace disease which could represent atelectasis or concurrent infection.   Electronically  Signed   By: Jeronimo Greaves M.D.   On: 10/24/2013 15:09   Ct Head Wo Contrast  10/13/2013   CLINICAL DATA:  Generalized weakness  EXAM: CT HEAD WITHOUT CONTRAST  TECHNIQUE: Contiguous axial images were obtained from the base of the skull through the vertex without intravenous contrast.  COMPARISON:  01/10/2012  FINDINGS: No skull fracture is noted. Paranasal sinuses and mastoid air cells are unremarkable. Stable cerebral atrophy. Stable periventricular and patchy subcortical chronic white matter disease. 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 No acute cortical infarction. No mass lesion is noted on this  unenhanced scan.  IMPRESSION: No acute intracranial abnormality. Again noted atrophy and chronic white matter disease. 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   Electronically Signed   By: Natasha Mead M.D.   On: 2013-10-27 13:48    CBC  Recent Labs Lab 10-27-2013 1213 10/26/13 0402  WBC 9.4 10.4  HGB 13.0 14.1  HCT 38.5* 41.6  PLT 148* 160  MCV 91.4 91.8  MCH 30.9 31.1  MCHC 33.8 33.9  RDW 13.6 13.8  LYMPHSABS 1.5  --   MONOABS 1.1*  --   EOSABS 0.1  --   BASOSABS 0.0  --     Chemistries   Recent Labs Lab 27-Oct-2013 1213 10/26/13 0402 10/27/13 0555  NA 137 134* 132*  K 3.8 4.6 4.5  CL 92* 92* 92*  CO2 30 24 23   GLUCOSE 123* 160* 115*  BUN 54* 59* 84*  CREATININE 1.91* 2.07* 3.06*  CALCIUM 8.7 8.8 8.7  AST 18  --   --   ALT 11  --   --   ALKPHOS 57  --   --   BILITOT 1.4*  --   --    ------------------------------------------------------------------------------------------------------------------ estimated creatinine clearance is 18.1 ml/min (by C-G formula based on Cr of 3.06). ------------------------------------------------------------------------------------------------------------------ No results found for this basename: HGBA1C,  in the last 72  hours ------------------------------------------------------------------------------------------------------------------ No results found for this basename: CHOL, HDL, LDLCALC, TRIG, CHOLHDL, LDLDIRECT,  in the last 72 hours ------------------------------------------------------------------------------------------------------------------ No results found for this basename: TSH, T4TOTAL, FREET3, T3FREE, THYROIDAB,  in the last 72 hours ------------------------------------------------------------------------------------------------------------------ No results found for this basename: VITAMINB12, FOLATE, FERRITIN, TIBC, IRON, RETICCTPCT,  in the last 72 hours  Coagulation profile  Recent Labs Lab 27-Oct-2013 1213 10/26/13 0402 10/27/13 0555  INR 5.04* 5.93* 2.53*    No results found for this basename: DDIMER,  in the last 72 hours  Cardiac Enzymes No results found for this basename: CK, CKMB, TROPONINI, MYOGLOBIN,  in the last 168 hours ------------------------------------------------------------------------------------------------------------------ No components found with this basename: POCBNP,      Time Spent in minutes   35   Samyria Rudie K M.D on 10/27/2013 at 12:36 PM  Between 7am to 7pm - Pager - [phone]  After 7pm go to [email or web address] - password [email or web address]  And look for the night coverage person covering for me after hours  [facility] Office  [phone]

## 2013-10-27 NOTE — Progress Notes (Signed)
Patient more restless this evening. Gurgling secretions. Weak nonproductive cough. Attempted oral suctioning. Unsuccessful. Medicated with Morphine bolus, haldol and Atropine SQ per orders. Gurgling secretions continue. Family voicing concerns. Dr. Phillips OdorGolding notified. Orders received for Morphine drip and repositioning on sides.

## 2013-10-27 NOTE — Progress Notes (Signed)
Nutrition Brief Note  Patient identified on the Malnutrition Screening Tool (MST) Report  Wt Readings from Last 15 Encounters:  10/26/13 160 lb 4.4 oz (72.7 kg)  10/01/13 168 lb (76.204 kg)  08/13/13 166 lb (75.297 kg)  06/24/13 167 lb 9.6 oz (76.023 kg)  06/03/13 172 lb 9.6 oz (78.291 kg)  05/08/13 169 lb (76.658 kg)  04/29/13 169 lb (76.658 kg)  04/01/13 174 lb (78.926 kg)  03/19/13 171 lb (77.565 kg)  03/11/13 169 lb (76.658 kg)  01/01/13 163 lb 4.8 oz (74.072 kg)  12/03/12 174 lb (78.926 kg)  11/06/12 180 lb 12.4 oz (82 kg)  11/02/12 186 lb 3.2 oz (84.46 kg)  07/03/12 185 lb (83.915 kg)    Body mass index is 22.36 kg/(m^2). Patient meets criteria for Normal Weight based on current BMI.   Current diet order is Heart Healthy. Per chart pt is full comfort care and is expected to pass away soon.  No nutrition interventions warranted at this time. If nutrition issues arise, please consult RD.   Ian Malkineanne Barnett RD, LDN Inpatient Clinical Dietitian Pager: [phone]

## 2013-10-27 NOTE — Progress Notes (Signed)
Repositioned, Morphine drip @1mg /511ml started. Family @ bs emotional support given

## 2013-10-30 NOTE — Telephone Encounter (Signed)
Pt passed away 07/25/2014.

## 2013-11-11 NOTE — Consult Note (Signed)
I have reviewed and discussed the care of this patient in detail with the nurse practitioner including pertinent patient records, physical exam findings and data. I agree with details of this encounter.  

## 2013-11-25 ENCOUNTER — Ambulatory Visit: Payer: Medicare Other | Admitting: Family Medicine

## 2013-11-26 NOTE — Progress Notes (Signed)
[facility]                                                                                          Death Note please see Last Note for all details in breif [name] [name] [id number] is a 80 y.o. male, Outpatient Primary MD for the patient is [name], MD  Pronounced dead by RN   On  2013/11/07  @  8.10 am            Cause of death cardiac arrest, he was on morphine drip with full comfort measures. Underlying history of pneumonia, severe systolic CHF and multiple comorbidities as below.   Leroy Sea M.D on 11-07-2013 at 9:53 AM  [facility] Office Phone -[phone]   Last Note                                                                                                                                                                                                 Patient Demographics  [name] [name], is a 80 y.o. male, DOB - 1926/03/13, [id number]  Admit date - 10/14/2013   Admitting Physician Leroy Sea, MD  Outpatient Primary MD for the patient is [name], MD  LOS - 3   Chief Complaint  Patient presents with  . Urinary Tract Infection        Assessment & Plan     Patient and family interested in pursuing palliative care options Pall.Care will be consulted, he has been transitioned to full comfort care upon family request, expected to pass away soon. All care and directed towards comfort only now.   Other issues addressed this admission were   1. Early community-acquired pneumonia - admit, sputum culture, urinary strep pneumo is negative and pending Legionella antigen, oxygen and nebulizer treatment as needed, IV Levaquin per pharmacy. Take improved.    2. Acute on chronic gout flare which is rather generalized and diffuse. Hydrate with IV fluids, placed on colchicine,  he is much improved today.    3. CAD with ischemic cardiomyopathy and chronic combined  diastolic systolic heart failure EF 20%, Old LBBB - Not a candidate for AICD, no acute issues, chest pain-free, for now appears dehydrated and required IV  fluids, continue beta blocker and antiplatelet medication Pletal, not ACE/ARB candidate due to renal insufficiency.     4. ARF on CKD 4 . Baseline creatinine Around 1.6, will check urine electrolytes, gentle IV fluids, avoid nephrotoxins and monitor.     5. A. fib. Goal will be rate controlled, continue beta blocker as told by blood pressure, Coumadin per pharmacy. Currently INR is supratherapeutic. We'll defer long-term anticoagulation use to PCP and primary cardiologist ordered depending on the clinical scenario upon discharge.     6. Interstitial lung disease. Supportive care for now no acute issues see #1 above.     7. AAA. Last known size is 5.6 cm, continue beta blocker for secondary prevention, not a candidate for surgery.     8.PAD - continue Pletal.     9.GERD - PPI         Code Status: DNR  Family Communication: Discussed with son and daughter upon admission  Disposition Plan: With hospice   Procedures CT Head   Consults  Pall Care   Medications  Scheduled Meds: . scopolamine  1 patch Transdermal Q72H   Continuous Infusions: . morphine 1 mg/hr (10/27/13 2117)   PRN Meds:.atropine, bisacodyl, haloperidol lactate, morphine injection, nitroGLYCERIN, ondansetron (ZOFRAN) IV  DVT Prophylaxis  Coumadin- SCDs    Lab Results  Component Value Date   PLT 160 10/26/2013    Antibiotics    Anti-infectives   Start     Dose/Rate Route Frequency Ordered Stop   10/26/13 1800  doxycycline (VIBRA-TABS) tablet 100 mg  Status:  Discontinued     100 mg Oral Every 12 hours 10/26/13 0932 10/27/13 0909   10/17/2013 1700  doxycycline (VIBRAMYCIN) 100 mg in dextrose 5 % 250 mL IVPB  Status:  Discontinued     100 mg 125 mL/hr over 120 Minutes Intravenous Every 12 hours 10/09/2013 1607 10/26/13 0931           Subjective:   [name] [name] today has, No headache, No chest pain, No abdominal pain - No Nausea, No new weakness tingling or numbness, improved generalized weakness and joint pains, No Cough - SOB.    Objective:   Filed Vitals:   10/26/13 1338 10/27/13 0510 10/27/13 1402 2013-11-13 0558  BP: 122/78 119/68 96/60 94/56   Pulse: 93 83 99 95  Temp: 97.4 F (36.3 C) 97.2 F (36.2 C) 97.6 F (36.4 C) 98.3 F (36.8 C)  TempSrc: Oral Axillary Axillary Axillary  Resp: 18 18 20 12   Height:      Weight:      SpO2: 98% 93% 98% 93%    Wt Readings from Last 3 Encounters:  10/26/13 72.7 kg (160 lb 4.4 oz)  10/01/13 76.204 kg (168 lb)  08/13/13 75.297 kg (166 lb)     Intake/Output Summary (Last 24 hours) at 11/13/2013 0953 Last data filed at 11/13/2013 0622  Gross per 24 hour  Intake      0 ml  Output      0 ml  Net      0 ml     Physical Exam  Awake Alert, Oriented X 3, No new F.N deficits, Normal affect [place].AT,PERRAL Supple Neck,No JVD, No cervical lymphadenopathy appriciated.  Symmetrical Chest wall movement, Good air movement bilaterally, CTAB RRR,No Gallops,Rubs or new Murmurs, No Parasternal Heave +ve B.Sounds, Abd Soft, Non tender, No organomegaly appriciated, No rebound - guarding or rigidity. No Cyanosis, Clubbing or edema, No new Rash or bruise , R elbow gouty tophus  Data Review   Micro Results Recent Results (from the past 240 hour(s))  URINE CULTURE     Status: None   Collection Time    10/04/2013 12:30 PM      Result Value Ref Range Status   Specimen Description URINE, CLEAN CATCH   Final   Special Requests NONE   Final   Culture  Setup Time     Final   Value: 10/11/2013 23:48     Performed at [facility] Count     Final   Value: 35,000 COLONIES/ML     Performed at [facility]   Culture     Final   Value: ESCHERICHIA COLI     Performed at [facility]   Report Status 10/27/2013 FINAL   Final   Organism ID, Bacteria  ESCHERICHIA COLI   Final    Radiology Reports Dg Chest 2 View  10/23/2013   CLINICAL DATA:  Short of breath. Urinary tract infection and weakness.  EXAM: CHEST  2 VIEW  COMPARISON:  DG CHEST 2 VIEW dated 06/07/2013; DG CHEST 2 VIEW dated 11/02/2012  FINDINGS: Mild hyperinflation. Midline trachea. Moderate cardiomegaly. Small bilateral pleural effusions. No pneumothorax. Interstitial prominence indistinctness. This is increased. Bibasilar airspace disease. Median sternotomy.  IMPRESSION: Suspect moderate congestive heart failure, superimposed upon chronic interstitial lung disease.  Small bilateral pleural effusions.  Patchy bibasilar airspace disease which could represent atelectasis or concurrent infection.   Electronically Signed   By: Jeronimo Greaves M.D.   On: 10/22/2013 15:09   Ct Head Wo Contrast  10/02/2013   CLINICAL DATA:  Generalized weakness  EXAM: CT HEAD WITHOUT CONTRAST  TECHNIQUE: Contiguous axial images were obtained from the base of the skull through the vertex without intravenous contrast.  COMPARISON:  01/10/2012  FINDINGS: No skull fracture is noted. Paranasal sinuses and mastoid air cells are unremarkable. Stable cerebral atrophy. Stable periventricular and patchy subcortical chronic white matter disease. 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 No acute cortical infarction. No mass lesion is noted on this unenhanced scan.  IMPRESSION: No acute intracranial abnormality. Again noted atrophy and chronic white matter disease. 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   Electronically Signed   By: Natasha Mead M.D.   On: 09/29/2013 13:48    CBC  Recent Labs Lab 10/11/2013 1213 10/26/13 0402  WBC 9.4 10.4  HGB 13.0 14.1  HCT 38.5* 41.6  PLT 148* 160  MCV 91.4 91.8  MCH 30.9 31.1  MCHC 33.8 33.9  RDW 13.6 13.8  LYMPHSABS 1.5  --   MONOABS 1.1*  --   EOSABS 0.1  --   BASOSABS 0.0  --     Chemistries   Recent Labs Lab 10/23/2013 1213 10/26/13 0402 10/27/13 0555  NA 137  134* 132*  K 3.8 4.6 4.5  CL 92* 92* 92*  CO2 30 24 23   GLUCOSE 123* 160* 115*  BUN 54* 59* 84*  CREATININE 1.91* 2.07* 3.06*  CALCIUM 8.7 8.8 8.7  AST 18  --   --   ALT 11  --   --   ALKPHOS 57  --   --   BILITOT 1.4*  --   --    ------------------------------------------------------------------------------------------------------------------ estimated creatinine clearance is 18.1 ml/min (by C-G formula based on Cr of 3.06). ------------------------------------------------------------------------------------------------------------------ No results found for this basename: HGBA1C,  in the last 72 hours ------------------------------------------------------------------------------------------------------------------ No results found for this basename: CHOL, HDL, LDLCALC, TRIG, CHOLHDL, LDLDIRECT,  in the  last 72 hours ------------------------------------------------------------------------------------------------------------------ No results found for this basename: TSH, T4TOTAL, FREET3, T3FREE, THYROIDAB,  in the last 72 hours ------------------------------------------------------------------------------------------------------------------ No results found for this basename: VITAMINB12, FOLATE, FERRITIN, TIBC, IRON, RETICCTPCT,  in the last 72 hours  Coagulation profile  Recent Labs Lab 10/07/2013 1213 10/26/13 0402 10/27/13 0555  INR 5.04* 5.93* 2.53*    No results found for this basename: DDIMER,  in the last 72 hours  Cardiac Enzymes No results found for this basename: CK, CKMB, TROPONINI, MYOGLOBIN,  in the last 168 hours ------------------------------------------------------------------------------------------------------------------ No components found with this basename: POCBNP,      Time Spent in minutes   35   SINGH,PRASHANT K M.D on 11/05/2013 at 9:53 AM  Between 7am to 7pm - Pager - [phone]  After 7pm go to [email or web address] - password [email or web address]  And look for  the night coverage person covering for me after hours  [facility] Office  [phone]

## 2013-11-26 NOTE — Progress Notes (Signed)
RN verified TOD/no signs of life.

## 2013-11-26 NOTE — Discharge Summary (Signed)
[facility]                                                                                          Death Note please see Last Note for all details in breif [name] [name] [id number] is a 80 y.o. male, Outpatient Primary MD for the patient is [name], MD  Pronounced dead by RN   On  November 12, 2013  @  8.10 am            Cause of death cardiac arrest, he was on morphine drip with full comfort measures. Underlying history of pneumonia, severe systolic CHF and multiple comorbidities as below.   Leroy Sea M.D on 11-12-2013 at 10:23 AM  [facility] Office Phone -[phone]   Last Note                                                                                                                                                                                                 Patient Demographics  [name] [name], is a 80 y.o. male, DOB - June 16, 1926, [id number]:[id number]  Admit date - 11-09-13   Admitting Physician Leroy Sea, MD  Outpatient Primary MD for the patient is [name], MD  LOS - 3   Chief Complaint  Patient presents with  . Urinary Tract Infection        Assessment & Plan     Patient and family interested in pursuing palliative care options Pall.Care will be consulted, he has been transitioned to full comfort care upon family request, expected to pass away soon. All care and directed towards comfort only now.   Other issues addressed this admission were   1. Early community-acquired pneumonia - admit, sputum culture, urinary strep pneumo is negative and pending Legionella antigen, oxygen and nebulizer treatment as needed, IV Levaquin per pharmacy. Take improved.    2. Acute on chronic gout flare which is rather generalized and diffuse. Hydrate with IV fluids, placed on colchicine,  he is much improved today.    3. CAD with ischemic cardiomyopathy and chronic  combined diastolic systolic heart failure EF 20%, Old LBBB - Not a candidate for AICD, no acute issues, chest pain-free, for now appears dehydrated and required IV  fluids, continue beta blocker and antiplatelet medication Pletal, not ACE/ARB candidate due to renal insufficiency.     4. ARF on CKD 4 . Baseline creatinine Around 1.6, will check urine electrolytes, gentle IV fluids, avoid nephrotoxins and monitor.     5. A. fib. Goal will be rate controlled, continue beta blocker as told by blood pressure, Coumadin per pharmacy. Currently INR is supratherapeutic. We'll defer long-term anticoagulation use to PCP and primary cardiologist ordered depending on the clinical scenario upon discharge.     6. Interstitial lung disease. Supportive care for now no acute issues see #1 above.     7. AAA. Last known size is 5.6 cm, continue beta blocker for secondary prevention, not a candidate for surgery.     8.PAD - continue Pletal.     9.GERD - PPI         Code Status: DNR  Family Communication: Discussed with son and daughter upon admission  Disposition Plan: With hospice   Procedures CT Head   Consults  Pall Care   Medications  Scheduled Meds: . scopolamine  1 patch Transdermal Q72H   Continuous Infusions: . morphine 1 mg/hr (10/27/13 2117)   PRN Meds:.atropine, bisacodyl, haloperidol lactate, morphine injection, nitroGLYCERIN, ondansetron (ZOFRAN) IV  DVT Prophylaxis  Coumadin- SCDs    Lab Results  Component Value Date   PLT 160 10/26/2013    Antibiotics    Anti-infectives   Start     Dose/Rate Route Frequency Ordered Stop   10/26/13 1800  doxycycline (VIBRA-TABS) tablet 100 mg  Status:  Discontinued     100 mg Oral Every 12 hours 10/26/13 0932 10/27/13 0909   10/21/2013 1700  doxycycline (VIBRAMYCIN) 100 mg in dextrose 5 % 250 mL IVPB  Status:  Discontinued     100 mg 125 mL/hr over 120 Minutes Intravenous Every 12 hours 10/21/2013 1607 10/26/13 0931           Subjective:   Jaquelyn Bitter today has, No headache, No chest pain, No abdominal pain - No Nausea, No new weakness tingling or numbness, improved generalized weakness and joint pains, No Cough - SOB.    Objective:   Filed Vitals:   10/26/13 1338 10/27/13 0510 10/27/13 1402 11/17/2013 0558  BP: 122/78 119/68 96/60 94/56   Pulse: 93 83 99 95  Temp: 97.4 F (36.3 C) 97.2 F (36.2 C) 97.6 F (36.4 C) 98.3 F (36.8 C)  TempSrc: Oral Axillary Axillary Axillary  Resp: 18 18 20 12   Height:      Weight:      SpO2: 98% 93% 98% 93%    Wt Readings from Last 3 Encounters:  10/26/13 72.7 kg (160 lb 4.4 oz)  10/01/13 76.204 kg (168 lb)  08/13/13 75.297 kg (166 lb)     Intake/Output Summary (Last 24 hours) at 11/16/2013 1023 Last data filed at 10/29/2013 0622  Gross per 24 hour  Intake      0 ml  Output      0 ml  Net      0 ml     Physical Exam  Awake Alert, Oriented X 3, No new F.N deficits, Normal affect [place].AT,PERRAL Supple Neck,No JVD, No cervical lymphadenopathy appriciated.  Symmetrical Chest wall movement, Good air movement bilaterally, CTAB RRR,No Gallops,Rubs or new Murmurs, No Parasternal Heave +ve B.Sounds, Abd Soft, Non tender, No organomegaly appriciated, No rebound - guarding or rigidity. No Cyanosis, Clubbing or edema, No new Rash or bruise , R elbow gouty tophus  Data Review   Micro Results Recent Results (from the past 240 hour(s))  URINE CULTURE     Status: None   Collection Time    10/24/2013 12:30 PM      Result Value Ref Range Status   Specimen Description URINE, CLEAN CATCH   Final   Special Requests NONE   Final   Culture  Setup Time     Final   Value: 10/08/2013 23:48     Performed at [facility] Count     Final   Value: 35,000 COLONIES/ML     Performed at [facility]   Culture     Final   Value: ESCHERICHIA COLI     Performed at [facility]   Report Status 10/27/2013 FINAL   Final   Organism ID, Bacteria  ESCHERICHIA COLI   Final    Radiology Reports Dg Chest 2 View  10/01/2013   CLINICAL DATA:  Short of breath. Urinary tract infection and weakness.  EXAM: CHEST  2 VIEW  COMPARISON:  DG CHEST 2 VIEW dated 06/07/2013; DG CHEST 2 VIEW dated 11/02/2012  FINDINGS: Mild hyperinflation. Midline trachea. Moderate cardiomegaly. Small bilateral pleural effusions. No pneumothorax. Interstitial prominence indistinctness. This is increased. Bibasilar airspace disease. Median sternotomy.  IMPRESSION: Suspect moderate congestive heart failure, superimposed upon chronic interstitial lung disease.  Small bilateral pleural effusions.  Patchy bibasilar airspace disease which could represent atelectasis or concurrent infection.   Electronically Signed   By: Jeronimo Greaves M.D.   On: 10/21/2013 15:09   Ct Head Wo Contrast  10/12/2013   CLINICAL DATA:  Generalized weakness  EXAM: CT HEAD WITHOUT CONTRAST  TECHNIQUE: Contiguous axial images were obtained from the base of the skull through the vertex without intravenous contrast.  COMPARISON:  01/10/2012  FINDINGS: No skull fracture is noted. Paranasal sinuses and mastoid air cells are unremarkable. Stable cerebral atrophy. Stable periventricular and patchy subcortical chronic white matter disease. 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 No acute cortical infarction. No mass lesion is noted on this unenhanced scan.  IMPRESSION: No acute intracranial abnormality. Again noted atrophy and chronic white matter disease. 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   Electronically Signed   By: Natasha Mead M.D.   On: 10/12/2013 13:48    CBC  Recent Labs Lab 10/02/2013 1213 10/26/13 0402  WBC 9.4 10.4  HGB 13.0 14.1  HCT 38.5* 41.6  PLT 148* 160  MCV 91.4 91.8  MCH 30.9 31.1  MCHC 33.8 33.9  RDW 13.6 13.8  LYMPHSABS 1.5  --   MONOABS 1.1*  --   EOSABS 0.1  --   BASOSABS 0.0  --     Chemistries   Recent Labs Lab 10/05/2013 1213 10/26/13 0402 10/27/13 0555  NA 137  134* 132*  K 3.8 4.6 4.5  CL 92* 92* 92*  CO2 30 24 23   GLUCOSE 123* 160* 115*  BUN 54* 59* 84*  CREATININE 1.91* 2.07* 3.06*  CALCIUM 8.7 8.8 8.7  AST 18  --   --   ALT 11  --   --   ALKPHOS 57  --   --   BILITOT 1.4*  --   --    ------------------------------------------------------------------------------------------------------------------ estimated creatinine clearance is 18.1 ml/min (by C-G formula based on Cr of 3.06). ------------------------------------------------------------------------------------------------------------------ No results found for this basename: HGBA1C,  in the last 72 hours ------------------------------------------------------------------------------------------------------------------ No results found for this basename: CHOL, HDL, LDLCALC, TRIG, CHOLHDL, LDLDIRECT,  in the  last 72 hours ------------------------------------------------------------------------------------------------------------------ No results found for this basename: TSH, T4TOTAL, FREET3, T3FREE, THYROIDAB,  in the last 72 hours ------------------------------------------------------------------------------------------------------------------ No results found for this basename: VITAMINB12, FOLATE, FERRITIN, TIBC, IRON, RETICCTPCT,  in the last 72 hours  Coagulation profile  Recent Labs Lab 10/23/2013 1213 10/26/13 0402 10/27/13 0555  INR 5.04* 5.93* 2.53*    No results found for this basename: DDIMER,  in the last 72 hours  Cardiac Enzymes No results found for this basename: CK, CKMB, TROPONINI, MYOGLOBIN,  in the last 168 hours ------------------------------------------------------------------------------------------------------------------ No components found with this basename: POCBNP,      Time Spent in minutes   35   Amesha Bailey K M.D on 11/05/2013 at 10:23 AM  Between 7am to 7pm - Pager - [phone]  After 7pm go to [email or web address] - password [email or web address]  And look for  the night coverage person covering for me after hours  [facility] Office  [phone]

## 2013-11-26 NOTE — Progress Notes (Signed)
Called by family and noted patient stop breathing. No pulse noted, no cardiac sound appreciated. Another nurse verified. Md made aware and an  order to pronounce death given.

## 2013-11-26 NOTE — Progress Notes (Signed)
Wasted about 60 ml of morphine drip witnessed by Annye RuskJoyce Whitaker,Rn.

## 2013-11-26 NOTE — Progress Notes (Signed)
[employer] notified about the death at 0830, patient is not suitable for any organ donation with the referral no 11/10/2013-025 spoke with diane Smith.

## 2013-11-26 DEATH — deceased

## 2014-02-04 ENCOUNTER — Ambulatory Visit: Payer: Medicare Other | Admitting: Cardiology

## 2014-04-07 IMAGING — US US CAROTID DUPLEX BILAT
1 series · 13 of 24 positions shown · non-contrast
Comparison: None

CLINICAL DATA: Coronary artery disease, peripheral vascular
disease, prior CABG, history smoking, hypertension, prior vascular
surgery, dizziness

BILATERAL CAROTID DUPLEX ULTRASOUND
TECHNIQUE: Gray scale imaging, color Doppler and duplex ultrasound
was performed of bilateral carotid and vertebral arteries in the
neck.

[Series 1: us carotid duplex bilat · 0.06mm/px · 71 acquisitions, 13 frames shown]
[im 1/71]
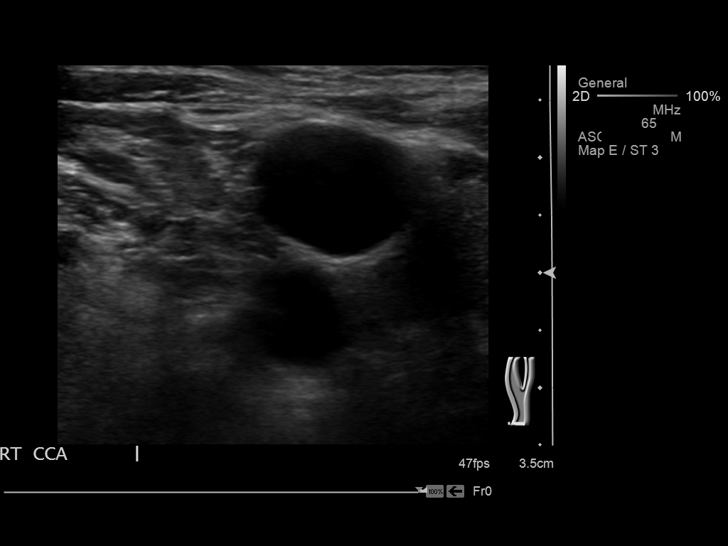
[im 7/71]
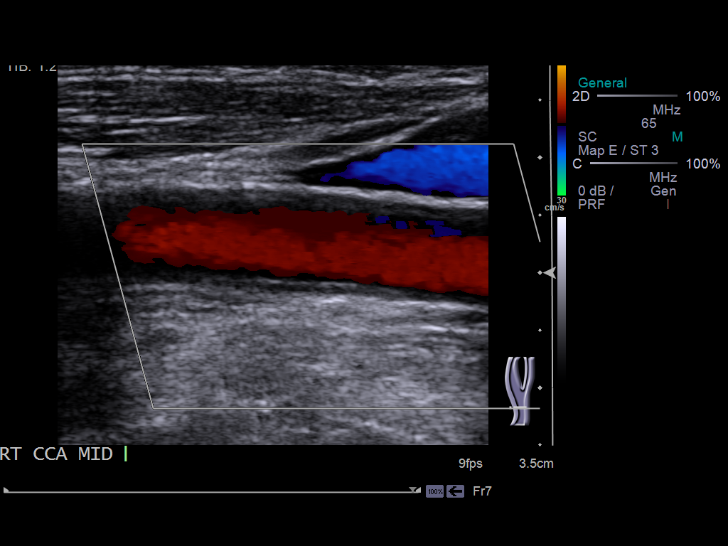
[im 13/71]
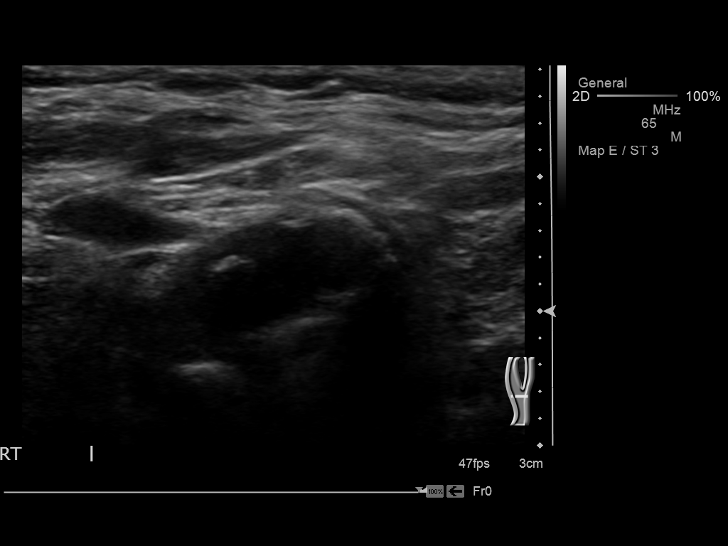
[im 19/71]
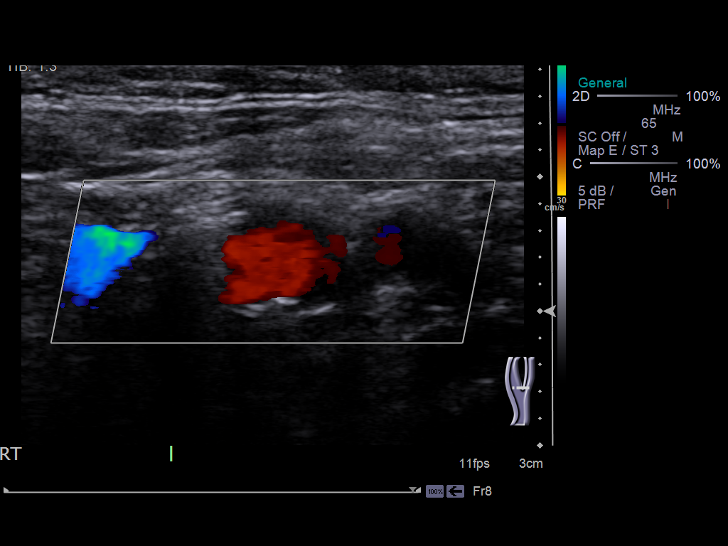
[im 25/71]
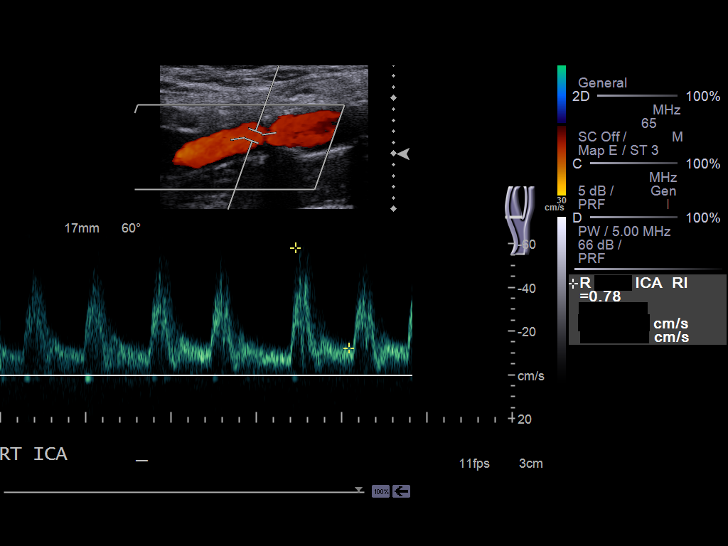
[im 31/71]
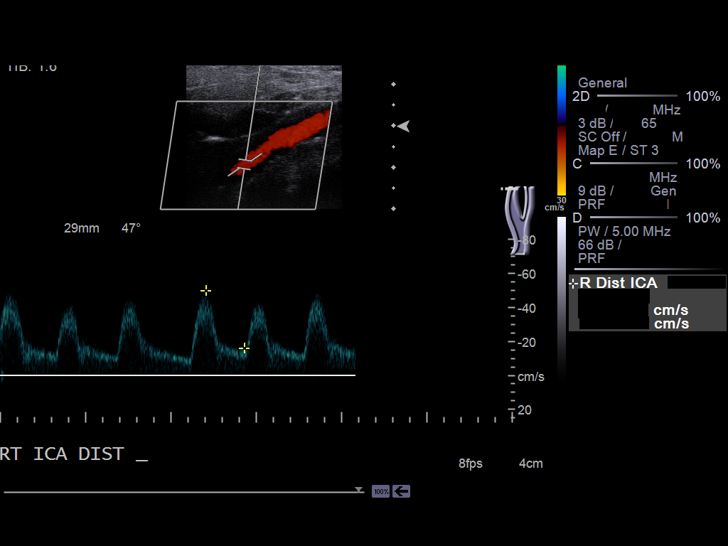
[im 40/71]
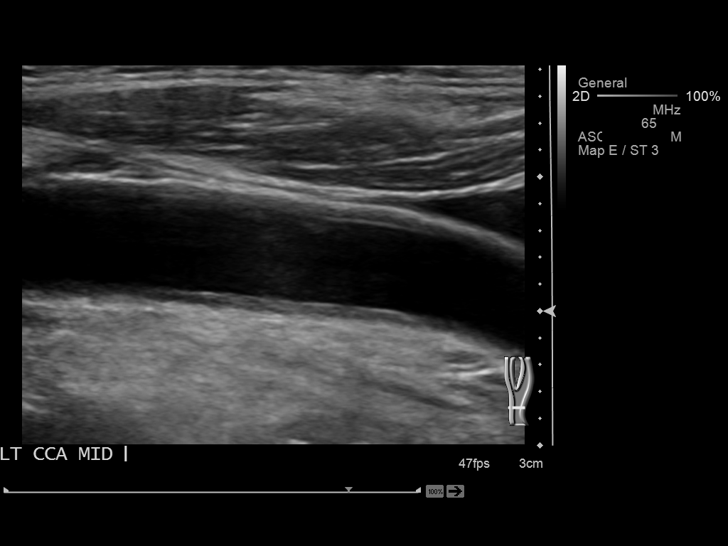
[im 43/71]
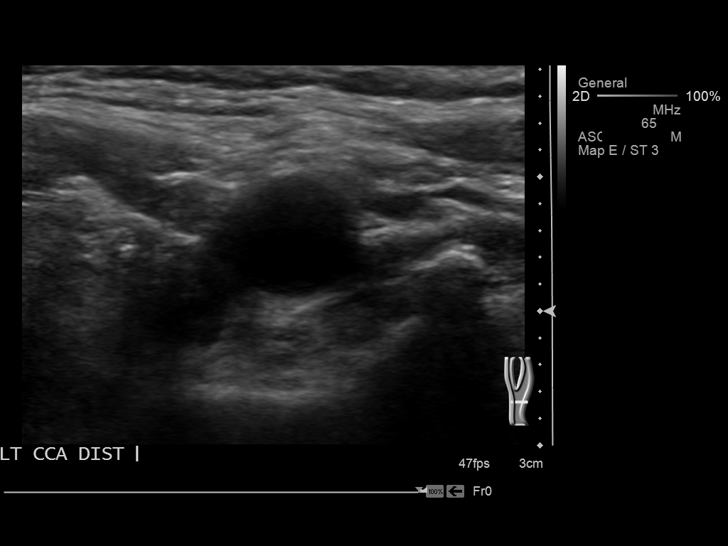
[im 49/71]
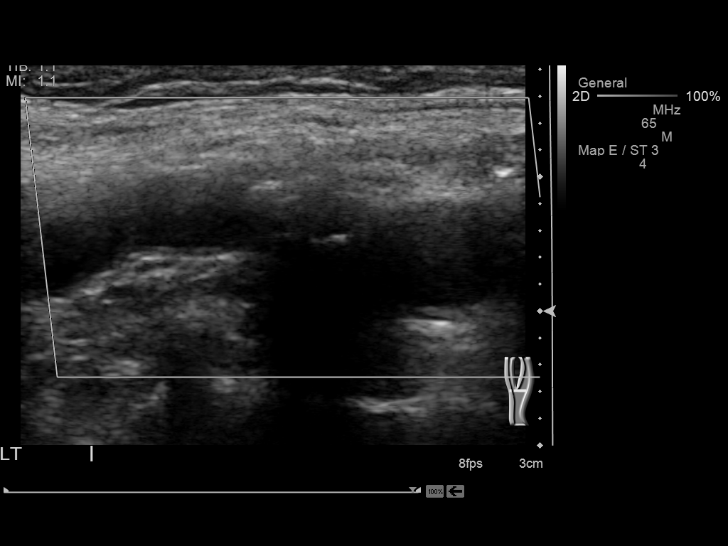
[im 55/71]
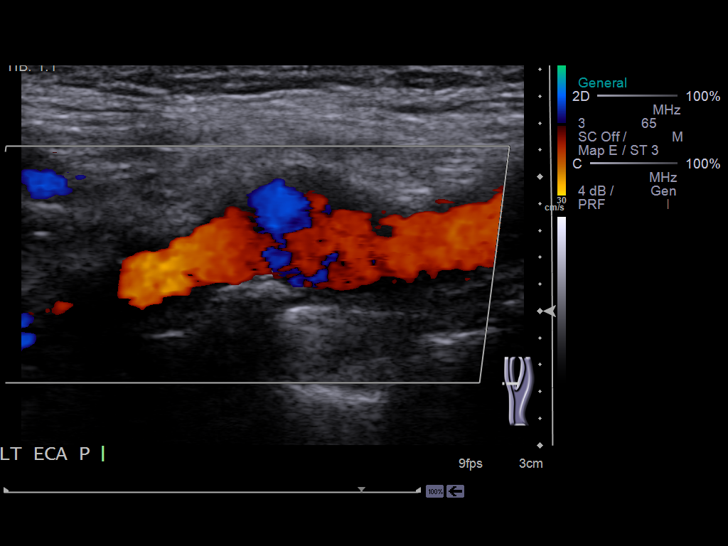
[im 61/71]
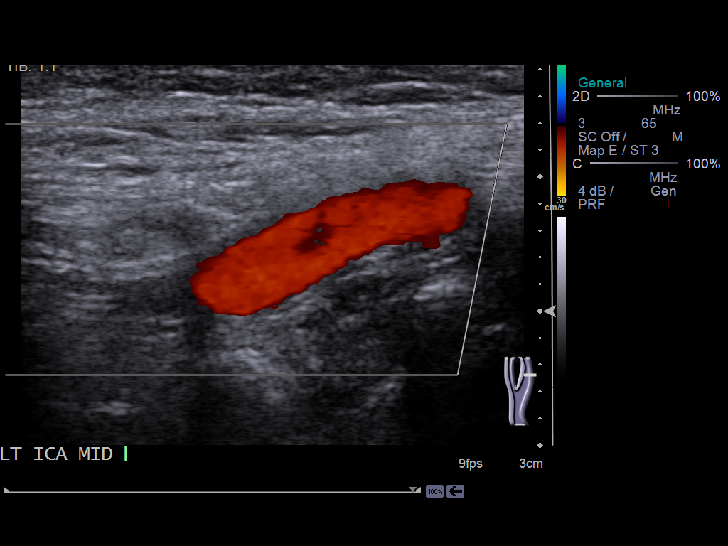
[im 67/71]
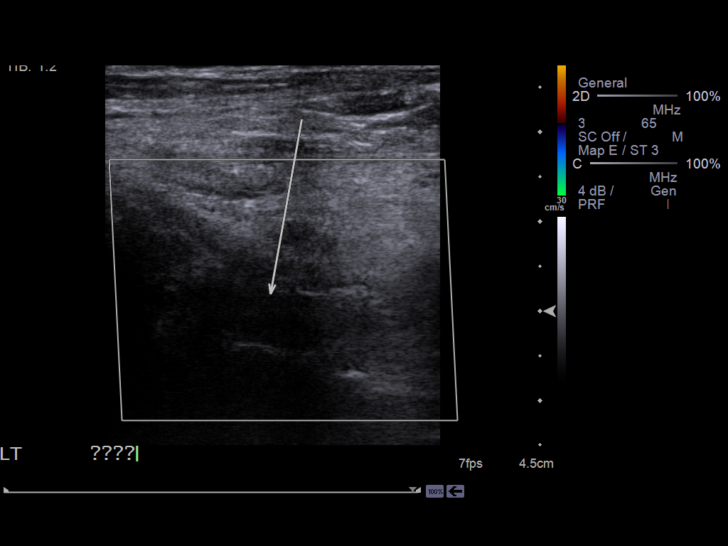
[im 71/71]
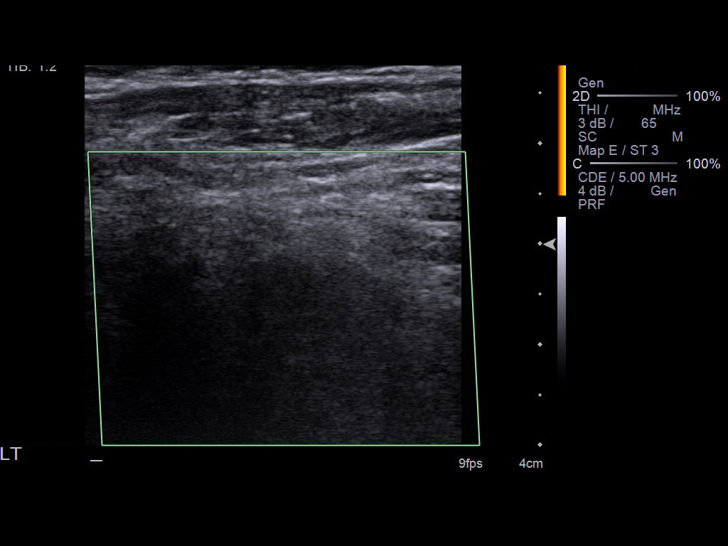

[13 of 24 positions shown; findings below may reference images not displayed]

Criteria:  Quantification of carotid stenosis is based on velocity
parameters that correlate the residual internal carotid diameter
with NASCET-based stenosis levels, using the diameter of the distal
internal carotid lumen as the denominator for stenosis measurement.

The following velocity measurements were obtained:

                 PEAK SYSTOLIC/END DIASTOLIC
RIGHT
ICA:                        64/18cm/sec
CCA:                        42/7cm/sec
SYSTOLIC ICA/CCA RATIO:
DIASTOLIC ICA/CCA RATIO:
ECA:                        53cm/sec

LEFT
ICA:                        74/16cm/sec
CCA:                        93/7cm/sec
SYSTOLIC ICA/CCA RATIO:
DIASTOLIC ICA/CCA RATIO:
ECA:                        67cm/sec
FINDINGS: RIGHT CAROTID ARTERY: Intimal thickening right CCA.  Mild
hypoechoic plaque right CCA right carotid bulb.  Additional plaque
within the right carotid bulb and proximal right ICA contains areas
of increased echogenicity and shadowing.  Laminar blood flow on
color Doppler imaging with minimal spectral broadening on waveform
analysis.  No high velocity jets.  Patient intermittently
arrhythmic.

RIGHT VERTEBRAL ARTERY:  Patent, antegrade

LEFT CAROTID ARTERY: Tortuous left carotid system.  Intimal
thickening left CCA.  Hypoechoic and echogenic calcified plaque
left carotid bulb.  Portions of the plaque or shadowing.  Scattered
mildly turbulent blood flow on color Doppler imaging with spectral
broadening on waveform analysis.  No high velocity jets.

LEFT VERTEBRAL ARTERY:  Flow could not be detected
IMPRESSION: Scattered hypoechoic and occasionally calcified plaques within both
carotid systems primarily at the bulbs into the proximal internal
carotid arteries.
No evidence of identically significant stenosis.
Unable to detect blood flow within the left vertebral artery,
question slow flow versus occlusion.

## 2014-05-14 ENCOUNTER — Ambulatory Visit: Payer: Medicare Other | Admitting: Cardiothoracic Surgery

## 2014-08-06 ENCOUNTER — Encounter (HOSPITAL_COMMUNITY): Payer: Self-pay | Admitting: Cardiovascular Disease

## 2015-01-29 IMAGING — CR DG CHEST 2V
2 series · 2 of 2 positions shown · non-contrast
Comparison: Chest x-ray 12/09/2011.

CLINICAL DATA: Shortness of breath.

CHEST - 2 VIEW

[w chest pa]
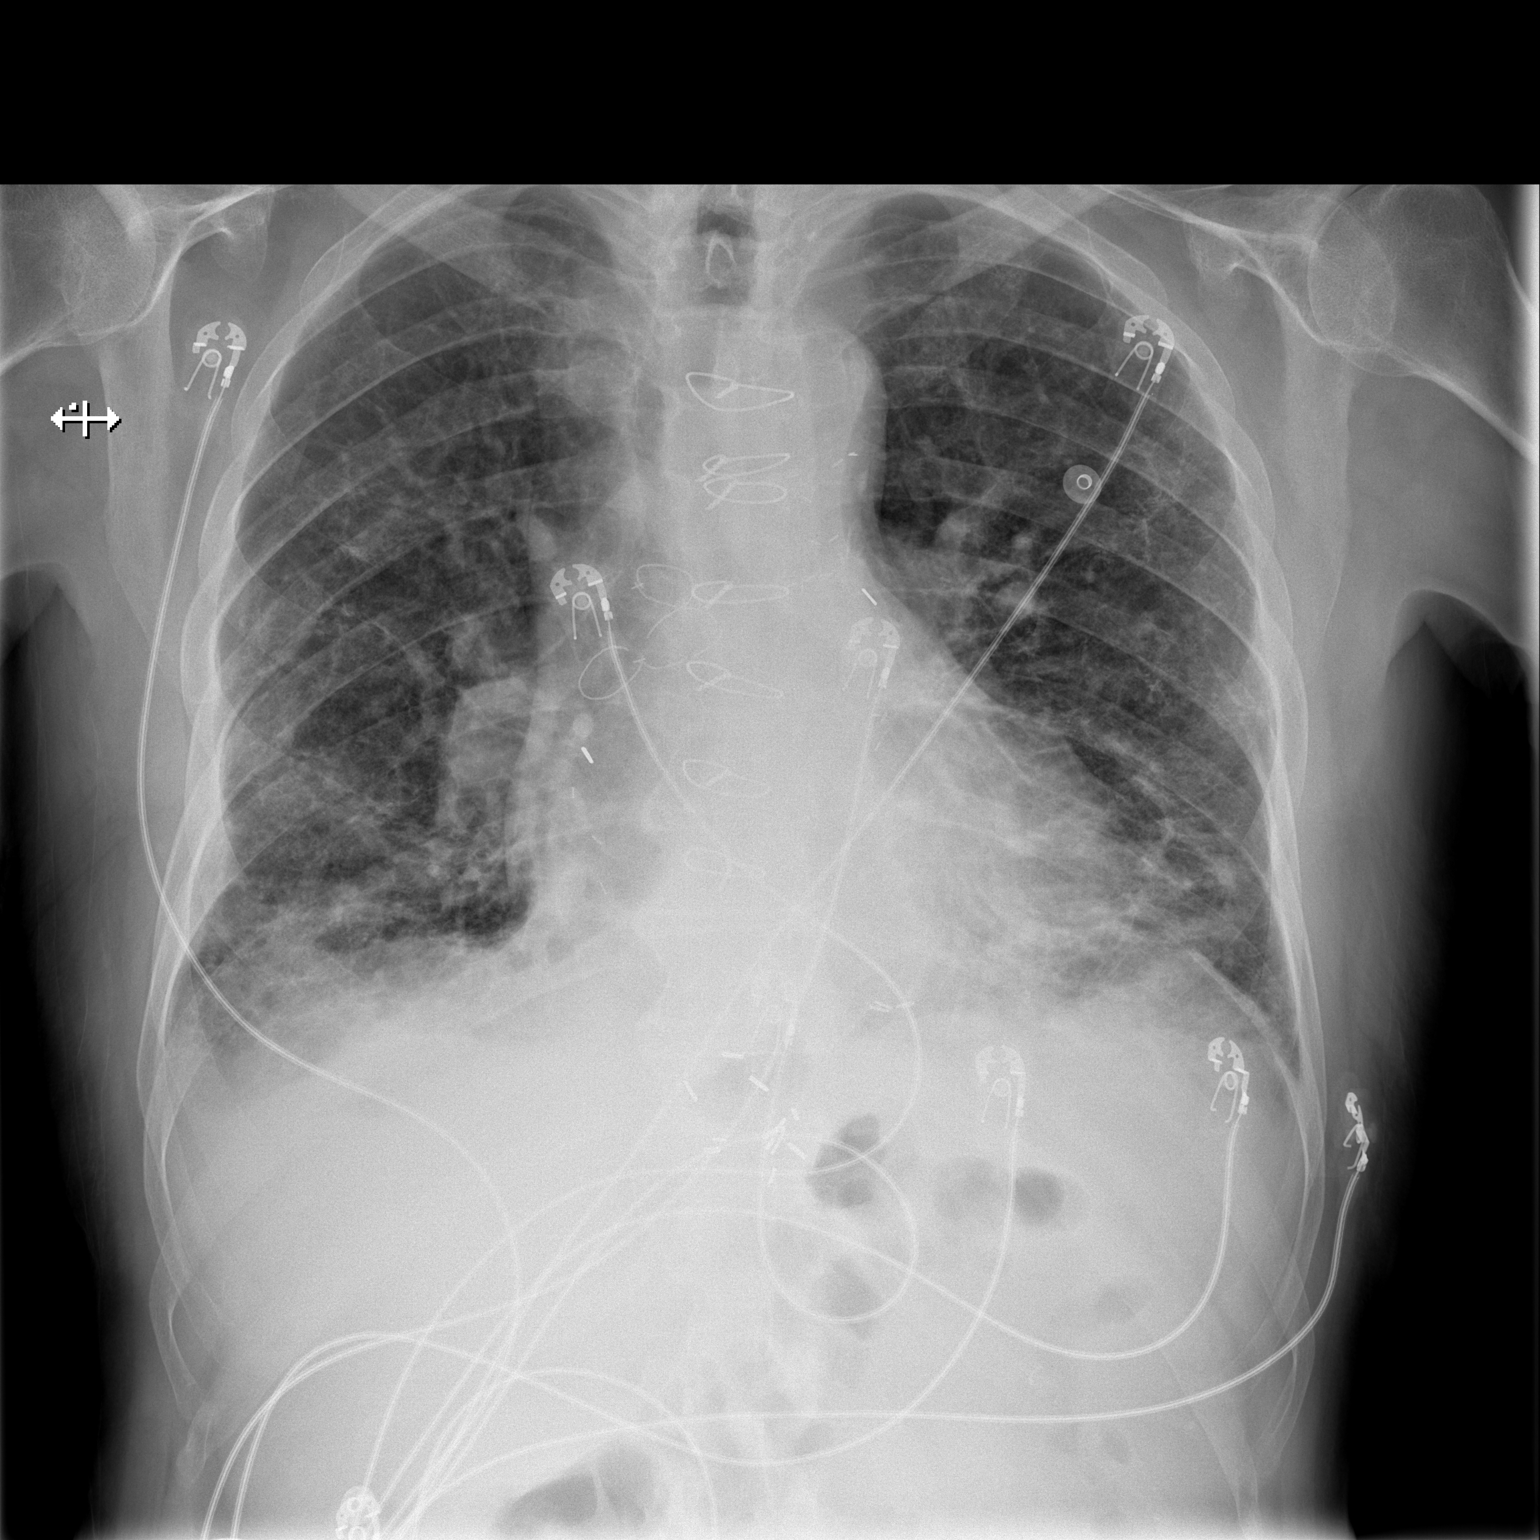

[w chest lat]
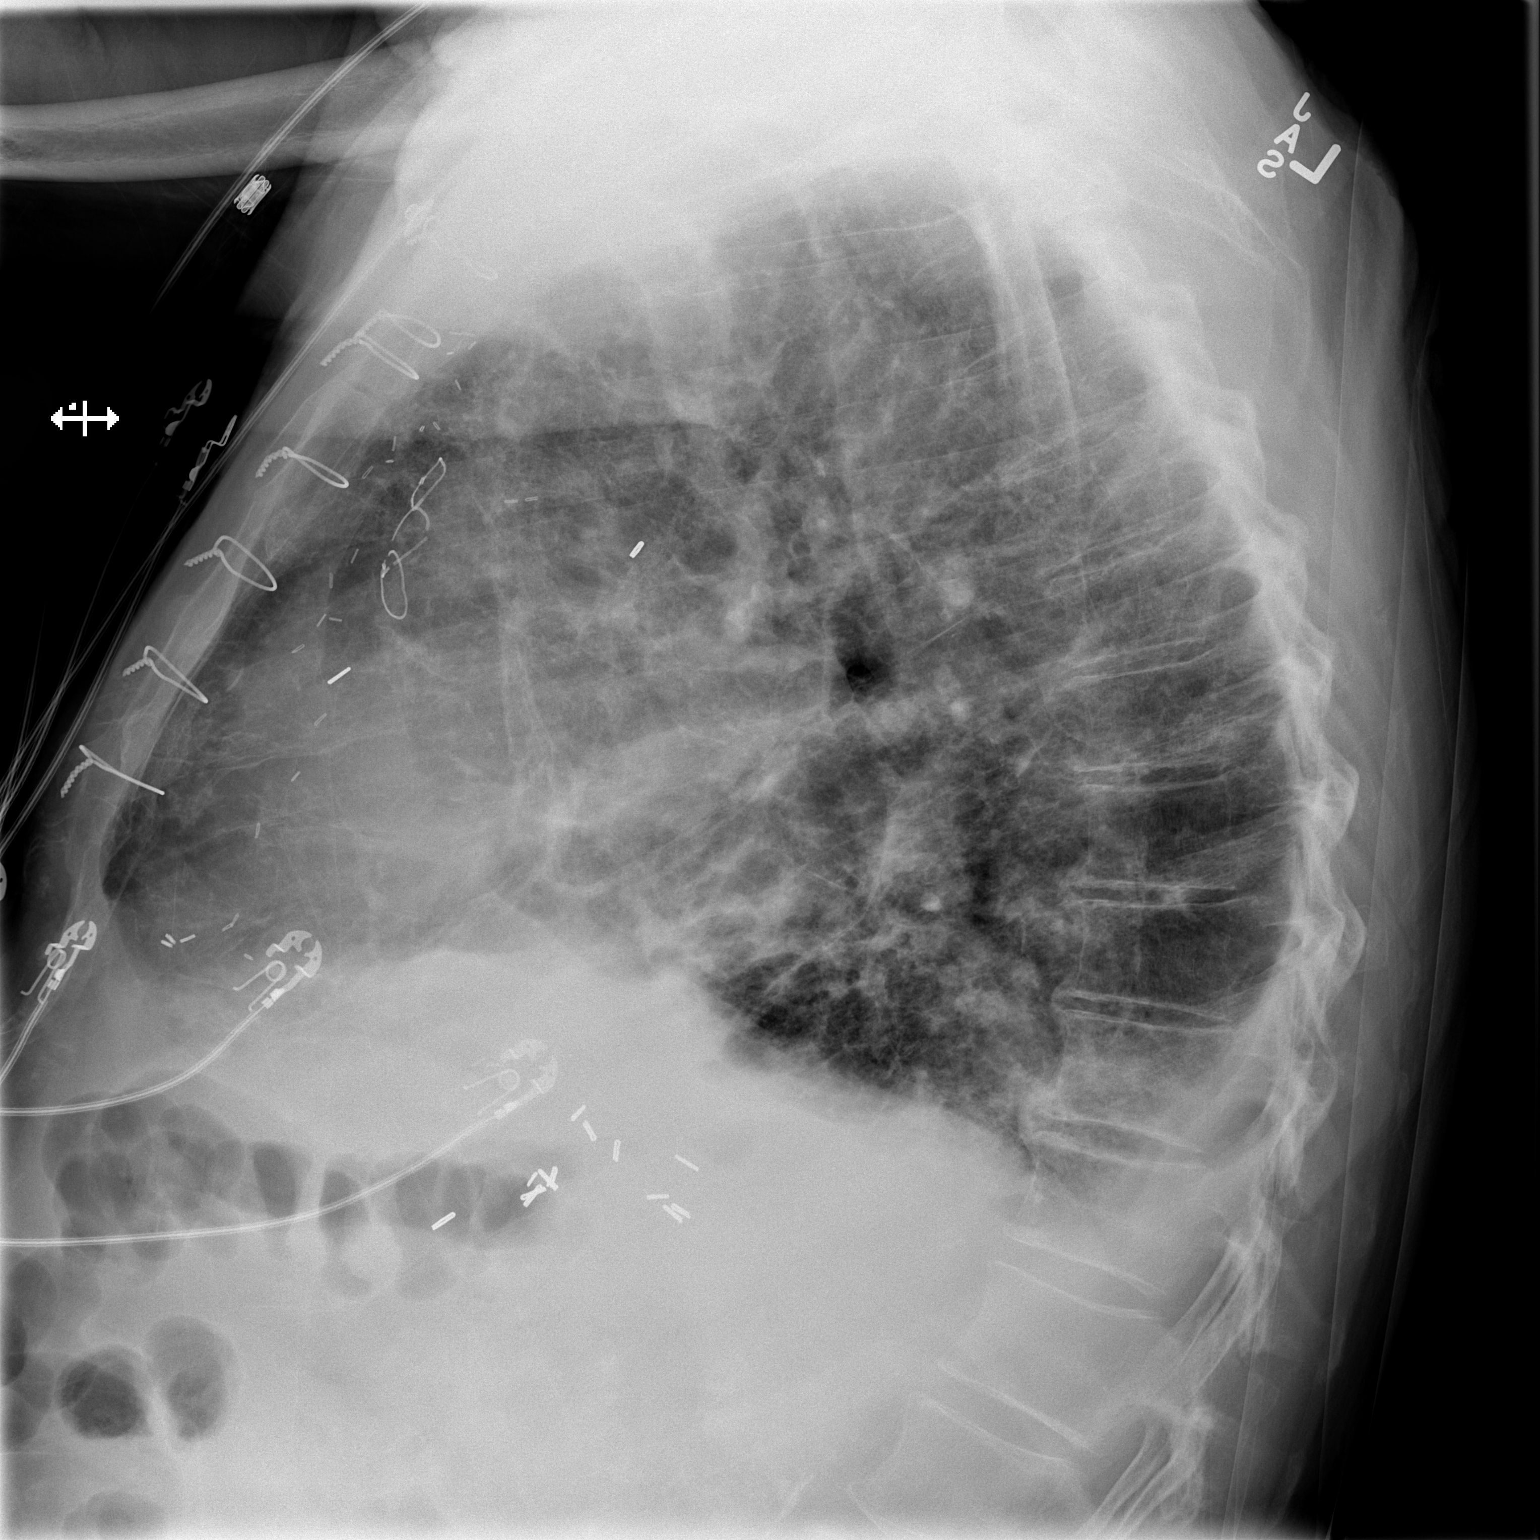

[2 of 2 positions shown; findings below may reference images not displayed]

FINDINGS: There are patchy areas of interstitial prominence and
architectural distortion and scattered throughout the mid and lower
lungs bilaterally, which is similar to but slightly increased
compared to the prior examination from 12/09/2011.  Patchy ill-
defined bibasilar opacities are also noted. Blunting of both
costophrenic sulci.  Pulmonary vasculature appears mildly engorged.
Mild cardiomegaly is unchanged. The patient is rotated to the right
on today's exam, resulting in distortion of the mediastinal
contours and reduced diagnostic sensitivity and specificity for
mediastinal pathology.  Atherosclerosis in the thoracic aorta.
Status post median sternotomy for CABG.  Multiple surgical clips
near the gastroesophageal junction.
IMPRESSION: 1.  Overall, the appearance of chest is very similar to the prior
examination, with a background of patchy diffuse interstitial
prominence which appears to be chronic and likely indicative of
some degree of multifocal scarring and/or underlying interstitial
lung disease.  In addition, there does appear to be some
cephalization of the pulmonary vasculature, given the presence of
cardiomegaly, superimposed interstitial pulmonary edema related to
congestive heart failure is possible.
2.  There are likely small bilateral pleural effusions (versus
chronic pleural scarring).
3.  Atherosclerosis.
4.  Postoperative changes, as above.

## 2015-06-15 IMAGING — CR DG CHEST 2V
2 series · 2 of 2 positions shown · non-contrast
Comparison: 12/29/2012

CLINICAL DATA: Dyspnea, history COPD, smoking, CHF, hypertension,
coronary artery disease post MI and CABG, CHF

CHEST - 2 VIEW

[view not recorded (1 of 2)]
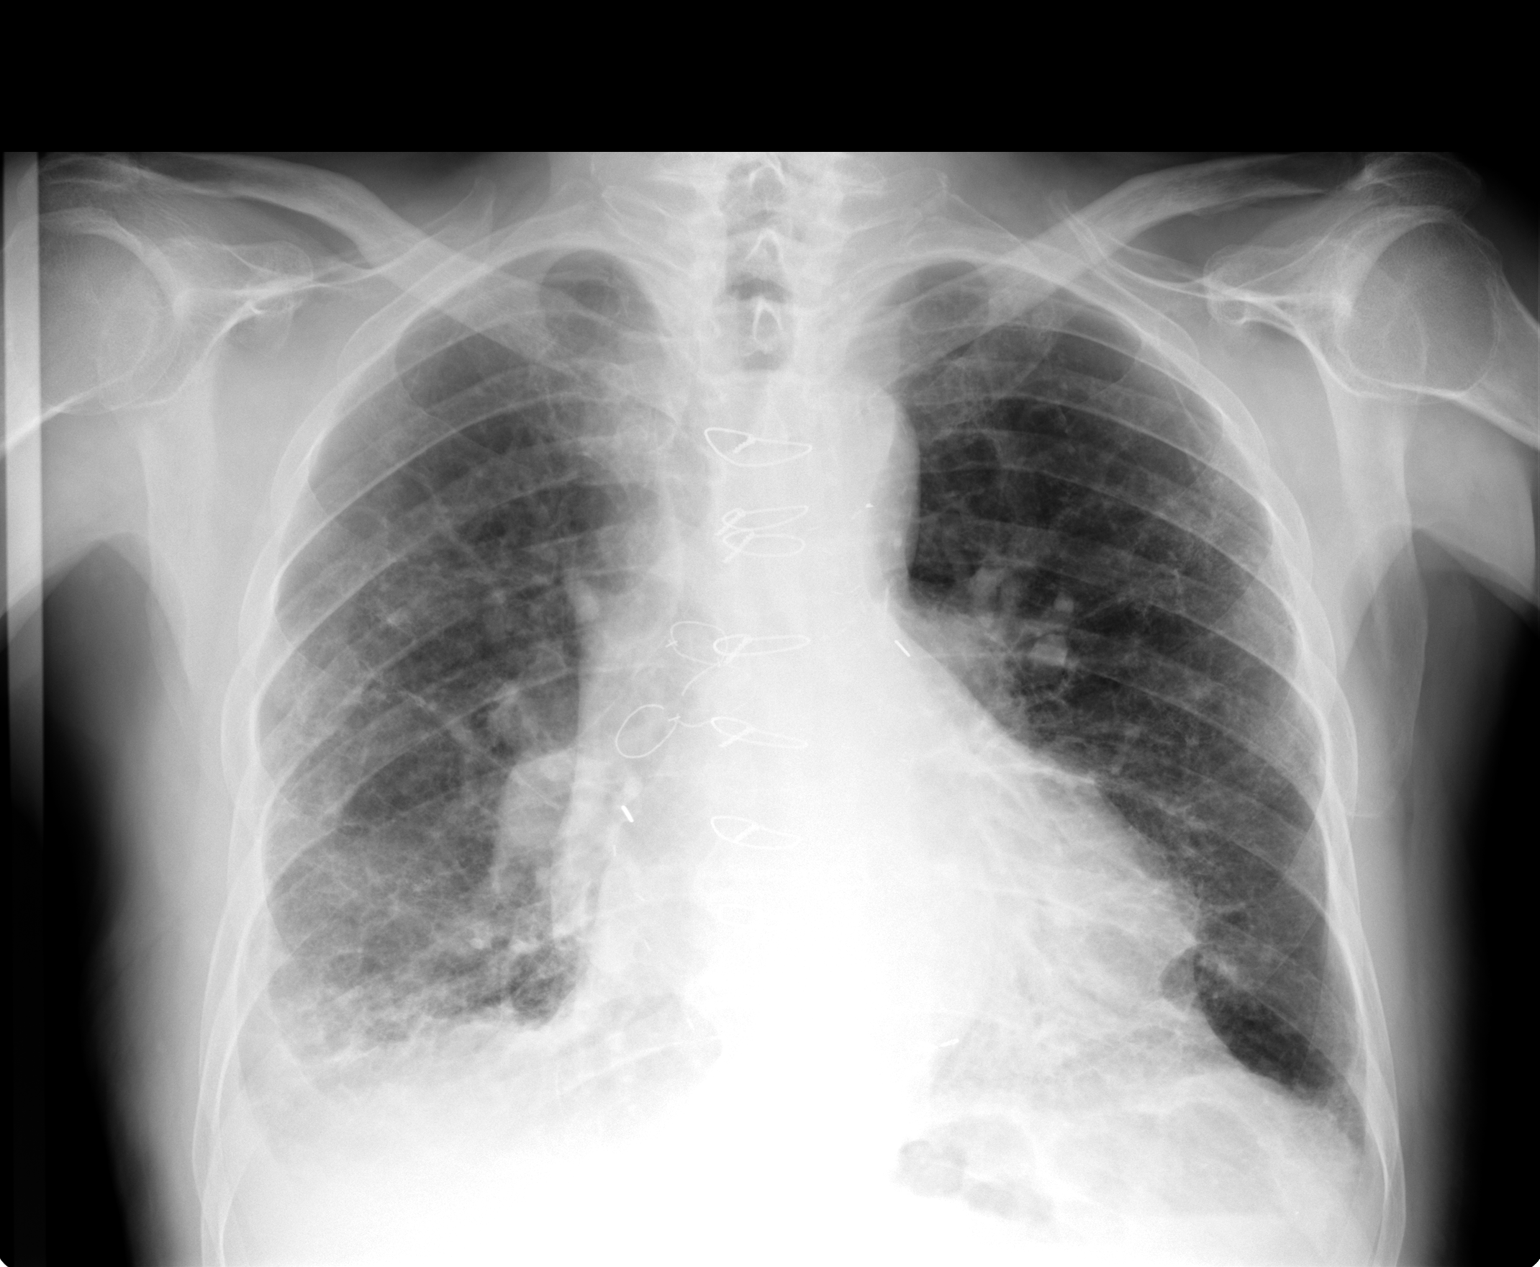

[view not recorded (2 of 2)]
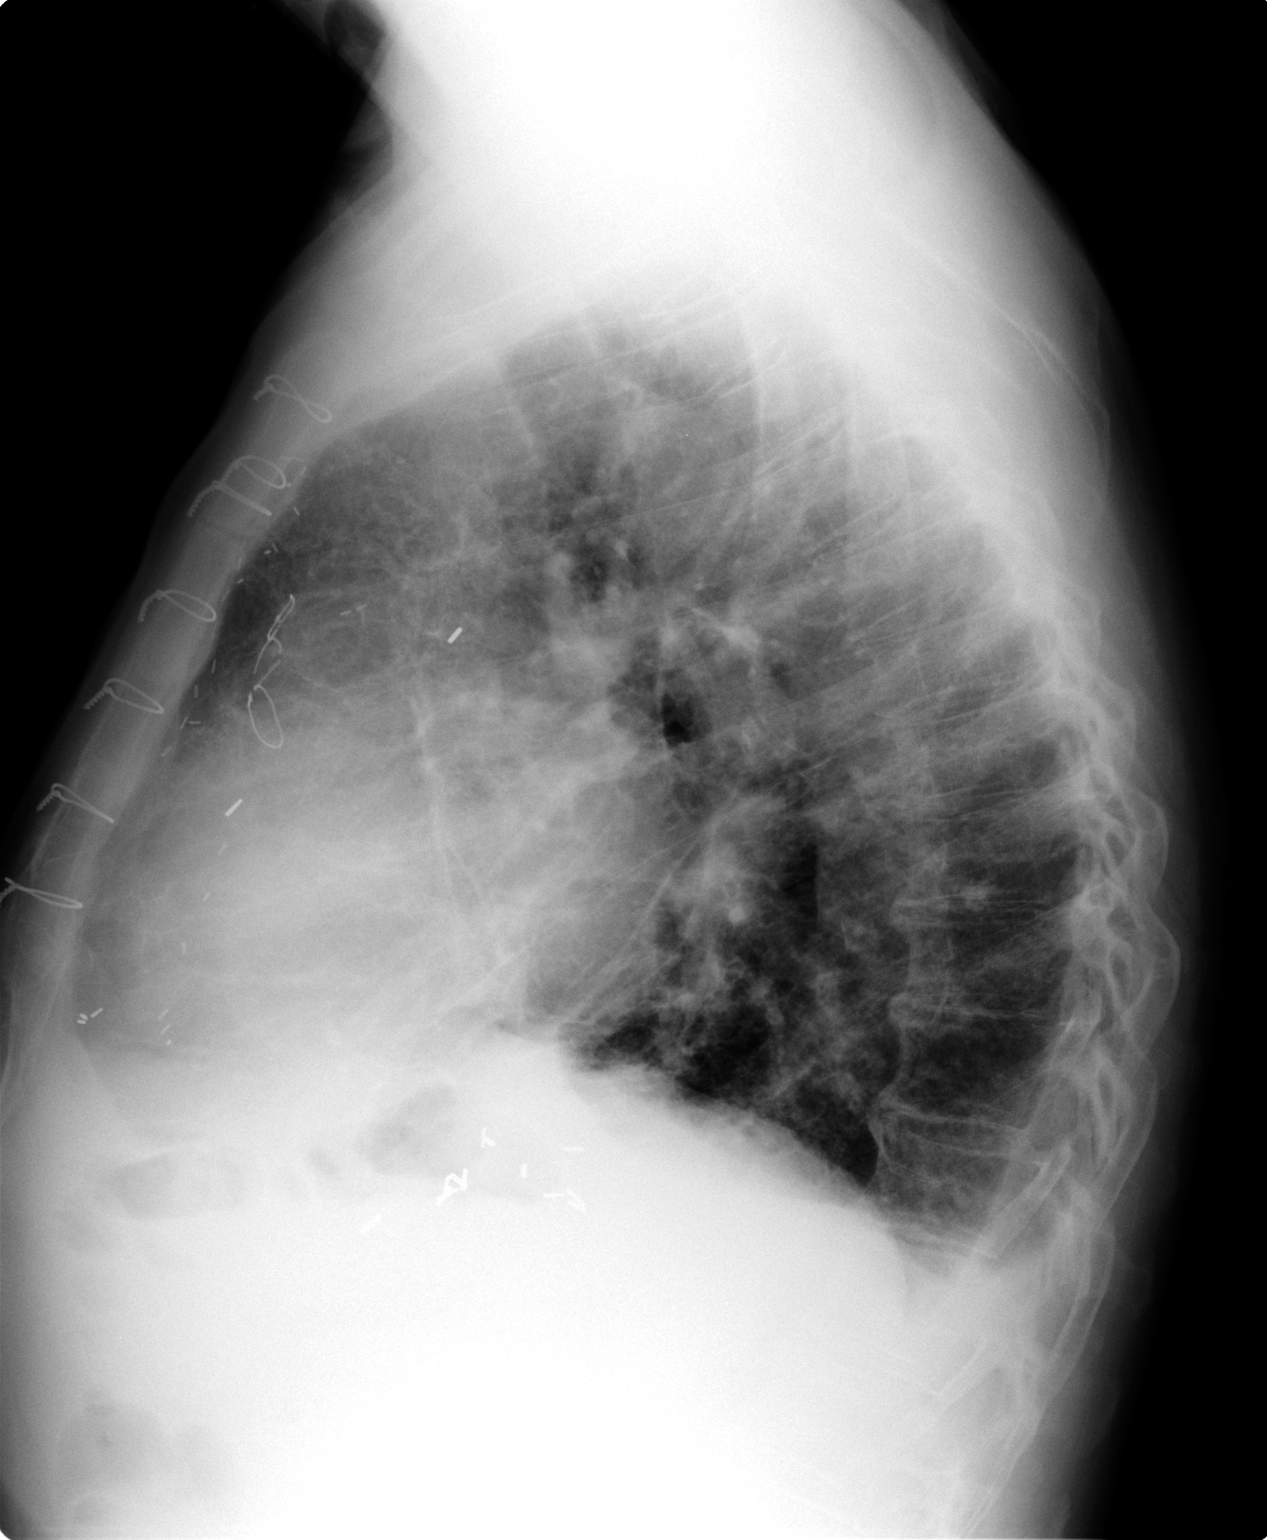

[2 of 2 positions shown; findings below may reference images not displayed]

FINDINGS: Enlargement of cardiac silhouette post CABG.
Tortuous aorta.
Pulmonary vascularity normal.
Scattered interstitial changes mid-to-lower lungs similar to
previous exam.
Persistent small right pleural effusion.
Emphysematous changes with question bibasilar fibrosis.
No definite acute infiltrate, pleural effusion or pneumothorax.
No acute osseous findings.
IMPRESSION: Enlargement of cardiac silhouette post CABG.
COPD and chronic interstitial lung disease changes question
fibrosis.
No acute abnormalities.
